# Patient Record
Sex: Male | Born: 1950 | Race: White | Hispanic: No | Marital: Married | Smoking: Never smoker
Health system: Southern US, Community
[De-identification: ages and names within clinical notes are randomized; demographics above are authoritative.]

## PROBLEM LIST (undated history)

## (undated) DIAGNOSIS — E785 Hyperlipidemia, unspecified: Secondary | ICD-10-CM

## (undated) DIAGNOSIS — R519 Headache, unspecified: Secondary | ICD-10-CM

## (undated) DIAGNOSIS — F329 Major depressive disorder, single episode, unspecified: Secondary | ICD-10-CM

## (undated) DIAGNOSIS — U071 COVID-19: Secondary | ICD-10-CM

## (undated) DIAGNOSIS — J189 Pneumonia, unspecified organism: Secondary | ICD-10-CM

## (undated) DIAGNOSIS — G4730 Sleep apnea, unspecified: Secondary | ICD-10-CM

## (undated) DIAGNOSIS — F32A Depression, unspecified: Secondary | ICD-10-CM

## (undated) DIAGNOSIS — F419 Anxiety disorder, unspecified: Secondary | ICD-10-CM

## (undated) DIAGNOSIS — J309 Allergic rhinitis, unspecified: Secondary | ICD-10-CM

## (undated) DIAGNOSIS — M1990 Unspecified osteoarthritis, unspecified site: Secondary | ICD-10-CM

## (undated) HISTORY — DX: Anxiety disorder, unspecified: F41.9

## (undated) HISTORY — PX: EYE SURGERY: SHX253

## (undated) HISTORY — PX: MEDIAL PARTIAL KNEE REPLACEMENT: SHX5965

## (undated) HISTORY — PX: TONSILLECTOMY: SUR1361

## (undated) HISTORY — DX: Hyperlipidemia, unspecified: E78.5

## (undated) HISTORY — DX: Pneumonia, unspecified organism: J18.9

## (undated) HISTORY — DX: Major depressive disorder, single episode, unspecified: F32.9

## (undated) HISTORY — DX: Depression, unspecified: F32.A

## (undated) HISTORY — PX: JOINT REPLACEMENT: SHX530

## (undated) HISTORY — PX: COLONOSCOPY: SHX174

## (undated) HISTORY — DX: Allergic rhinitis, unspecified: J30.9

## (undated) HISTORY — PX: KNEE SURGERY: SHX244

---

## 1990-01-29 HISTORY — PX: KNEE SURGERY: SHX244

## 1998-10-04 DIAGNOSIS — J189 Pneumonia, unspecified organism: Secondary | ICD-10-CM

## 1998-10-04 HISTORY — DX: Pneumonia, unspecified organism: J18.9

## 2003-12-10 ENCOUNTER — Ambulatory Visit: Payer: Self-pay | Admitting: Internal Medicine

## 2004-02-14 ENCOUNTER — Ambulatory Visit: Payer: Self-pay | Admitting: Internal Medicine

## 2004-05-17 ENCOUNTER — Ambulatory Visit: Payer: Self-pay | Admitting: Internal Medicine

## 2004-09-08 ENCOUNTER — Ambulatory Visit: Payer: Self-pay | Admitting: Internal Medicine

## 2004-09-19 ENCOUNTER — Ambulatory Visit: Payer: Self-pay | Admitting: Internal Medicine

## 2006-01-29 HISTORY — PX: KNEE ARTHROSCOPY W/ PARTIAL MEDIAL MENISCECTOMY: SHX1882

## 2006-08-19 ENCOUNTER — Ambulatory Visit: Payer: Self-pay | Admitting: Orthopaedic Surgery

## 2006-08-21 ENCOUNTER — Ambulatory Visit: Payer: Self-pay | Admitting: Orthopaedic Surgery

## 2006-08-21 ENCOUNTER — Emergency Department: Payer: Self-pay | Admitting: Emergency Medicine

## 2007-02-20 ENCOUNTER — Ambulatory Visit: Payer: Self-pay | Admitting: Internal Medicine

## 2007-02-20 DIAGNOSIS — J309 Allergic rhinitis, unspecified: Secondary | ICD-10-CM | POA: Insufficient documentation

## 2007-02-20 DIAGNOSIS — F3342 Major depressive disorder, recurrent, in full remission: Secondary | ICD-10-CM | POA: Insufficient documentation

## 2007-02-20 DIAGNOSIS — F3340 Major depressive disorder, recurrent, in remission, unspecified: Secondary | ICD-10-CM

## 2007-02-20 DIAGNOSIS — F331 Major depressive disorder, recurrent, moderate: Secondary | ICD-10-CM | POA: Insufficient documentation

## 2007-02-20 DIAGNOSIS — F411 Generalized anxiety disorder: Secondary | ICD-10-CM | POA: Insufficient documentation

## 2007-02-20 DIAGNOSIS — E785 Hyperlipidemia, unspecified: Secondary | ICD-10-CM | POA: Insufficient documentation

## 2007-02-24 ENCOUNTER — Telehealth (INDEPENDENT_AMBULATORY_CARE_PROVIDER_SITE_OTHER): Payer: Self-pay | Admitting: *Deleted

## 2007-04-09 ENCOUNTER — Telehealth (INDEPENDENT_AMBULATORY_CARE_PROVIDER_SITE_OTHER): Payer: Self-pay | Admitting: *Deleted

## 2007-06-19 ENCOUNTER — Telehealth (INDEPENDENT_AMBULATORY_CARE_PROVIDER_SITE_OTHER): Payer: Self-pay | Admitting: *Deleted

## 2007-07-31 ENCOUNTER — Ambulatory Visit: Payer: Self-pay | Admitting: Family Medicine

## 2007-10-24 ENCOUNTER — Ambulatory Visit: Payer: Self-pay | Admitting: Internal Medicine

## 2007-10-29 ENCOUNTER — Ambulatory Visit: Payer: Self-pay | Admitting: Internal Medicine

## 2007-10-30 LAB — CONVERTED CEMR LAB
Glucose, Bld: 91 mg/dL (ref 70–99)
LDL Cholesterol: 141 mg/dL — ABNORMAL HIGH (ref 0–99)
Total CHOL/HDL Ratio: 5.5
Triglycerides: 111 mg/dL (ref 0–149)

## 2007-12-10 ENCOUNTER — Ambulatory Visit: Payer: Self-pay | Admitting: Family Medicine

## 2007-12-10 DIAGNOSIS — M1710 Unilateral primary osteoarthritis, unspecified knee: Secondary | ICD-10-CM

## 2007-12-10 DIAGNOSIS — G4700 Insomnia, unspecified: Secondary | ICD-10-CM | POA: Insufficient documentation

## 2007-12-10 DIAGNOSIS — M2240 Chondromalacia patellae, unspecified knee: Secondary | ICD-10-CM | POA: Insufficient documentation

## 2007-12-10 DIAGNOSIS — IMO0002 Reserved for concepts with insufficient information to code with codable children: Secondary | ICD-10-CM | POA: Insufficient documentation

## 2008-08-09 ENCOUNTER — Ambulatory Visit: Payer: Self-pay | Admitting: Internal Medicine

## 2008-08-09 DIAGNOSIS — L02419 Cutaneous abscess of limb, unspecified: Secondary | ICD-10-CM | POA: Insufficient documentation

## 2008-08-09 DIAGNOSIS — L03119 Cellulitis of unspecified part of limb: Secondary | ICD-10-CM

## 2008-10-15 ENCOUNTER — Ambulatory Visit: Payer: Self-pay | Admitting: Internal Medicine

## 2008-10-15 DIAGNOSIS — R030 Elevated blood-pressure reading, without diagnosis of hypertension: Secondary | ICD-10-CM | POA: Insufficient documentation

## 2008-10-18 LAB — CONVERTED CEMR LAB
Albumin: 4.1 g/dL (ref 3.5–5.2)
Alkaline Phosphatase: 58 units/L (ref 39–117)
Basophils Absolute: 0 10*3/uL (ref 0.0–0.1)
Bilirubin, Direct: 0 mg/dL (ref 0.0–0.3)
Calcium: 9.2 mg/dL (ref 8.4–10.5)
Creatinine, Ser: 1 mg/dL (ref 0.4–1.5)
Eosinophils Absolute: 0.1 10*3/uL (ref 0.0–0.7)
Free T4: 0.9 ng/dL (ref 0.6–1.6)
Glucose, Bld: 88 mg/dL (ref 70–99)
Lymphocytes Relative: 39.1 % (ref 12.0–46.0)
MCHC: 34.5 g/dL (ref 30.0–36.0)
Microalb Creat Ratio: 3.2 mg/g (ref 0.0–30.0)
Monocytes Relative: 7.5 % (ref 3.0–12.0)
Neutrophils Relative %: 51.1 % (ref 43.0–77.0)
PSA: 1.4 ng/mL (ref 0.10–4.00)
Phosphorus: 3.4 mg/dL (ref 2.3–4.6)
RBC: 5.15 M/uL (ref 4.22–5.81)
RDW: 12.9 % (ref 11.5–14.6)
Sodium: 141 meq/L (ref 135–145)

## 2008-12-29 ENCOUNTER — Encounter: Payer: Self-pay | Admitting: Internal Medicine

## 2008-12-29 ENCOUNTER — Telehealth: Payer: Self-pay | Admitting: Internal Medicine

## 2009-01-12 ENCOUNTER — Ambulatory Visit: Payer: Self-pay | Admitting: Gastroenterology

## 2009-01-12 ENCOUNTER — Encounter: Payer: Self-pay | Admitting: Internal Medicine

## 2009-01-12 LAB — HM COLONOSCOPY: HM Colonoscopy: NORMAL

## 2009-02-15 ENCOUNTER — Telehealth: Payer: Self-pay | Admitting: Internal Medicine

## 2009-02-16 ENCOUNTER — Telehealth: Payer: Self-pay | Admitting: Internal Medicine

## 2009-02-28 ENCOUNTER — Ambulatory Visit: Payer: Self-pay | Admitting: Internal Medicine

## 2009-03-18 ENCOUNTER — Ambulatory Visit: Payer: Self-pay | Admitting: Unknown Physician Specialty

## 2009-04-20 ENCOUNTER — Ambulatory Visit: Payer: Self-pay | Admitting: Unknown Physician Specialty

## 2009-04-27 ENCOUNTER — Ambulatory Visit: Payer: Self-pay | Admitting: Unknown Physician Specialty

## 2009-05-30 ENCOUNTER — Ambulatory Visit: Payer: Self-pay | Admitting: Internal Medicine

## 2010-02-28 NOTE — Progress Notes (Signed)
Summary: Request medication for stress  Phone Note Call from Patient Call back at [phone] or [phone]   Caller: Patient Call For: Cindee Salt MD Summary of Call: Patient is calling to find out if you will put him back on the medication  that you had him previously for his nerves because it worked well. 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 [facility] [address] Initial call taken by: Sydell Axon LPN,  February 15, 2009 12:41 PM  Follow-up for Phone Call        Please let him know that Rx has been sent. Have him schedule an appt in 3-4 weeks to review how he is doing Follow-up by: Cindee Salt MD,  February 15, 2009 1:36 PM  Additional Follow-up for Phone Call Additional follow up Details #1::        Patient notified as instructed by telephone. Pt already has appt scheduled to see Dr. Alphonsus Sias on 02/28/09 at 3pm. Lewanda Rife LPN  February 15, 2009 3:37 PM     New/Updated Medications: CITALOPRAM HYDROBROMIDE 20 MG TABS (CITALOPRAM HYDROBROMIDE) 1 daily for mood problems Prescriptions: CITALOPRAM HYDROBROMIDE 20 MG TABS (CITALOPRAM HYDROBROMIDE) 1 daily for mood problems  #30 x 1   Entered and Authorized by:   Cindee Salt MD   Signed by:   Cindee Salt MD on 02/15/2009   Method used:   Electronically to        [facility] [phone]* (retail)       [address] [place], [place]  [address]       Ph: [phone] or [phone]       Fax: [phone]   RxID:   [id number]

## 2010-02-28 NOTE — Assessment & Plan Note (Signed)
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  Past Medical History: Reviewed history from 02/20/2007 and no changes required. Allergic rhinitis Depression Hyperlipidemia Anxiety  Past Surgical History: Reviewed history from 12/10/2007 and no changes required. Pneumonia- out patient 905/2000) Right knee, loose body removed (1992) 2008 Right knee arthroscopy, partial menisectomy and patellar chondroplasty ([facility])  Family History: Reviewed history from 10/24/2007 and no changes required. Father: deceased- rupture of AAA Mother: died with CAD,HTN, MI Prostate cancer in pat GF No DM  Social History: Reviewed history from 10/24/2007 and no changes required. 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  Uses breath rite strips ???sleep apnea---does wear out by 6PM wears seat belt. Eyes:  Denies double vision and vision loss-1 eye; occ sees "strange spots" for 30 minutes. Has had eye exams . ENT:  Denies decreased hearing and ringing in ears; teeth okay--regular with dentist. CV:  Denies chest pain or discomfort, difficulty breathing at night, difficulty breathing while lying down, fainting, lightheadness, palpitations, and shortness of breath with exertion. Resp:  Denies cough and shortness of breath. GI:  Complains of change in bowel habits; denies abdominal pain, bloody stools, dark tarry stools, indigestion, nausea, and vomiting; brief loose stools--relates to nerves acting up recently. GU:  Denies erectile dysfunction, urinary frequency, and urinary hesitancy. MS:  Complains of joint pain; denies joint swelling; having more knee pain Dr Gavin Potters did cortisone shots in left. Has trouble with right as well (surgery in past) tries to ride stationary bike Not much cardio. Derm:  Complains of dryness; denies lesion(s) and rash; dry feet--okay with lotion. Neuro:  Denies headaches, numbness, tingling, and weakness. Psych:  Complains of depression; denies anxiety; has the depressed mood but not the anxiety . Heme:  Denies abnormal bruising and enlarge lymph nodes. Allergy:  Denies seasonal allergies and sneezing.  Physical Exam  General:  alert and normal appearance.   Eyes:  pupils equal, pupils round, pupils reactive to light, and no optic disk abnormalities.   Ears:  R ear normal and L ear normal.   Mouth:  no erythema and no lesions.   Neck:  supple, no masses, no thyromegaly, no carotid bruits, and no cervical lymphadenopathy.   Lungs:  normal respiratory effort and normal breath sounds.  Heart:  normal rate, regular rhythm, no murmur, and no gallop.   Abdomen:  soft and non-tender.   Msk:  Knees thickened without effusions Pulses:  1+ in feet Extremities:  no edema Neurologic:   alert & oriented X3, strength normal in all extremities, and gait normal.   Skin:  no rashes and no suspicious lesions.   Axillary Nodes:  No palpable lymphadenopathy Psych:  normally interactive, good eye contact, not anxious appearing, and not depressed appearing.     Impression & Recommendations:  Problem # 1:  PREVENTIVE HEALTH CARE (ICD-V70.0) Assessment Comment Only healthy but out of shape wife worried about his heart---discussed increasing aerobic work and considering stress test if he has sig problems discussed continuing better eating  Problem # 2:  DEPRESSION (ICD-311) Assessment: Improved discussed need to continue for at least a year  His updated medication list for this problem includes:    Citalopram Hydrobromide 20 Mg Tabs (Citalopram hydrobromide) .Marland Kitchen... 1 daily for mood problems  Problem # 3:  INSOMNIA-SLEEP DISORDER-UNSPEC (ICD-780.52) Assessment: Comment Only discussed weaning down to prevent dependence  His updated medication list for this problem includes:    Zolpidem Tartrate 10 Mg Tabs (Zolpidem tartrate) .Marland Kitchen... 1/2-1 at bedtime as needed insomnia  Complete Medication List: 1)  Citalopram Hydrobromide 20 Mg Tabs (Citalopram hydrobromide) .Marland Kitchen.. 1 daily for mood problems 2)  Zolpidem Tartrate 10 Mg Tabs (Zolpidem tartrate) .... 1/2-1 at bedtime as needed insomnia 3)  Naproxen Sodium 275 Mg Tabs (Naproxen sodium) .... Pt not sure of dose  Patient Instructions: 1)  Please schedule a follow-up appointment in 3 months .   Current Allergies (reviewed today): No known allergies

## 2010-02-28 NOTE — Assessment & Plan Note (Signed)
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  Past Medical History: Reviewed history from 02/20/2007 and no changes required. Allergic rhinitis Depression Hyperlipidemia Anxiety  Past Surgical History: Reviewed history from 12/10/2007 and no changes required. Pneumonia- out patient 905/2000) Right knee, loose body removed (1992) 2008 Right knee arthroscopy, partial menisectomy and patellar chondroplasty ([facility])  Family History: Reviewed history from 10/24/2007 and no changes required. Father: deceased- rupture of AAA Mother: died with CAD,HTN, MI Prostate cancer in pat GF No DM  Social History: Reviewed history from 10/24/2007 and no changes required. 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 1 daily for mood problems  Complete Medication List: 1)  Citalopram Hydrobromide 20 Mg Tabs (Citalopram hydrobromide) .Marland Kitchen.. 1 daily for mood problems  Patient Instructions: 1)  Please schedule physical in 9-12 months  Current Allergies (reviewed today): No known allergies

## 2010-02-28 NOTE — Progress Notes (Signed)
Summary: Rx to help him sleep  Phone Note Call from Patient Call back at Home Phone [phone]   Caller: Patient Call For: Cindee Salt MD Summary of Call: Patient left a message on voicemail stating that he requested medication from Dr. Alphonsus Sias to help with anxiety but now he cannot sleep.  Hasn't slept in five nights.  He said Dr. Alphonsus Sias gave him some medication in the past to help him sleep and it worked very well.  He also took one of his wife's Ambien tablets and he said that worked well also.  Please advise.   Initial call taken by: Linde Gillis CMA Duncan Dull),  February 16, 2009 11:39 AM  Follow-up for Phone Call        okay  zolpidem 10mg   #30 x 0 1/2-1 at bedtime as needed insomnia Let him know that if he takes this regularly he can get dependent so limit use Follow-up by: Cindee Salt MD,  February 16, 2009 1:48 PM  Additional Follow-up for Phone Call Additional follow up Details #1::        Rx Called In, Spoke with patient and advised results.  Additional Follow-up by: Mervin Hack CMA Duncan Dull),  February 16, 2009 3:53 PM    New/Updated Medications: ZOLPIDEM TARTRATE 10 MG TABS (ZOLPIDEM TARTRATE) 1/2-1 at bedtime as needed insomnia Prescriptions: ZOLPIDEM TARTRATE 10 MG TABS (ZOLPIDEM TARTRATE) 1/2-1 at bedtime as needed insomnia  #30 x 0   Entered by:   Mervin Hack CMA (AAMA)   Authorized by:   Cindee Salt MD   Signed by:   Mervin Hack CMA (AAMA) on 02/16/2009   Method used:   Telephoned to ...       [facility] [phone][facility] (retail)       [address] [place], [place]  [address]       Ph: [phone] or [phone]       Fax: [phone]   RxID:   [id number] ZOLPIDEM TARTRATE 10 MG TABS (ZOLPIDEM TARTRATE) 1/2-1 at bedtime as needed insomnia  #30 x 0   Entered by:   Mervin Hack CMA (AAMA)   Authorized by:   Cindee Salt MD   Signed by:   Mervin Hack CMA (AAMA) on 02/16/2009   Method used:   Print  then Give to Patient   RxID:   [id number]

## 2010-03-11 ENCOUNTER — Encounter: Payer: Self-pay | Admitting: Internal Medicine

## 2010-04-24 ENCOUNTER — Other Ambulatory Visit: Payer: Self-pay

## 2010-05-04 ENCOUNTER — Encounter: Payer: Self-pay | Admitting: Internal Medicine

## 2010-05-04 DIAGNOSIS — Z0289 Encounter for other administrative examinations: Secondary | ICD-10-CM

## 2010-05-05 ENCOUNTER — Other Ambulatory Visit: Payer: Self-pay | Admitting: Internal Medicine

## 2010-05-08 ENCOUNTER — Other Ambulatory Visit: Payer: Self-pay | Admitting: *Deleted

## 2010-05-08 MED ORDER — CITALOPRAM HYDROBROMIDE 20 MG PO TABS: ORAL_TABLET | ORAL | Status: DC

## 2010-05-08 NOTE — Telephone Encounter (Signed)
Okay #30 x 1 He needs to reschedule the visit he no showed for

## 2010-05-08 NOTE — Telephone Encounter (Signed)
Ok to refill 

## 2010-05-08 NOTE — Telephone Encounter (Signed)
I think this is a duplicate Okay #30 x 0 but he needs to reschedule his appt he no showed for

## 2010-05-08 NOTE — Telephone Encounter (Signed)
RX SENT TO PHARMACY

## 2010-06-19 ENCOUNTER — Ambulatory Visit (INDEPENDENT_AMBULATORY_CARE_PROVIDER_SITE_OTHER): Payer: BC Managed Care – PPO | Admitting: Internal Medicine

## 2010-06-19 ENCOUNTER — Encounter: Payer: Self-pay | Admitting: Internal Medicine

## 2010-06-19 VITALS — BP 132/96 | HR 84 | Temp 98.6°F | Ht 67.5 in | Wt 211.5 lb

## 2010-06-19 DIAGNOSIS — L570 Actinic keratosis: Secondary | ICD-10-CM

## 2010-06-19 DIAGNOSIS — F329 Major depressive disorder, single episode, unspecified: Secondary | ICD-10-CM

## 2010-06-19 DIAGNOSIS — R030 Elevated blood-pressure reading, without diagnosis of hypertension: Secondary | ICD-10-CM

## 2010-06-19 DIAGNOSIS — Z0000 Encounter for general adult medical examination without abnormal findings: Secondary | ICD-10-CM

## 2010-06-19 DIAGNOSIS — G4700 Insomnia, unspecified: Secondary | ICD-10-CM

## 2010-06-19 NOTE — Patient Instructions (Addendum)
Please cut the citalopram in half and just take 10mg  daily If your mood is fine after one month, start skipping days. Skip one day a week for 5-7 days, then skip 2 days for 5-7 days, etc till off.  Restart if you have recurrence of depression symptoms when off the med or lowering the dose

## 2010-06-19 NOTE — Progress Notes (Signed)
Subjective:    Patient ID: Brendan Bell, Brendan Bell    DOB: 1950/11/28, 60 y.o.   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  , Disp: , Rfl:   Past Medical History  Diagnosis Date  . Rhinitis, allergic   . Depression   . Hyperlipidemia   . Anxiety   . Pneumonia 10/04/98    out patient    Past Surgical History  Procedure Date  . Knee surgery 1992    right, loose body removed  . Knee arthroscopy w/ partial medial meniscectomy 2008    with pateller chondroplasty ([facility])    Family History  Problem Relation Age of Onset  . Heart disease Mother     CAD, MI  . Hypertension Mother   . Stroke Father   . Aneurysm Father     died from rupture of AAA  . Cancer Paternal Grandfather     prostate    History   Social History  . Marital Status: Married    Spouse Name: N/A    Number of Children: 5  . Years of Education: N/A   Occupational History  . business owner     Education officer, museum, former Land   Social History Main Topics  . Smoking status: Never Smoker   . Smokeless tobacco: Never Used  . Alcohol Use: Yes     2 beers a day in general.  . Drug Use: No  . Sexually Active: Not on file   Other Topics Concern  . Not on file   Social History Narrative   2nd marriage.  3  children from first marriage, 2 stepchildren, 2 with current marriage.   Review of Systems  Constitutional: Negative for fever and fatigue.       Wears seat belt  HENT: Positive for dental problem. Negative for hearing loss, congestion, rhinorrhea and tinnitus.        Lost tooth and crown---wondering about implant  Eyes: Negative for visual disturbance.       No diplopia or focal vision loss  Respiratory: Negative for cough, chest tightness and shortness of breath.   Cardiovascular: Negative for chest pain, palpitations and leg swelling.  Gastrointestinal: Negative for nausea, vomiting, abdominal pain, constipation and blood in stool.       No heartburn  Genitourinary: Negative for dysuria, urgency, decreased urine volume and difficulty urinating.       Nocturia x 1 NO sexual problems  Musculoskeletal: Positive for arthralgias. Negative for back pain and joint swelling.       Knees are only sig arthritic part  Skin: Negative for rash.       Has firm nodule in front of right ear  Neurological: Negative for dizziness, syncope, weakness, light-headedness, numbness and headaches.       Occ numbness in hands if drives for a while--goes  away quickly  Hematological: Negative for adenopathy. Does not bruise/bleed easily.  Psychiatric/Behavioral: Negative for sleep disturbance and dysphoric mood. The patient is not nervous/anxious.        Objective:   Physical Exam  Constitutional: He is oriented to person, place, and time. He appears well-developed and well-nourished. No distress.  HENT:  Head: Normocephalic and atraumatic.  Right Ear: External ear normal.  Left Ear: External ear normal.  Mouth/Throat: Oropharynx is clear and moist. No oropharyngeal exudate.       TMs normal  Eyes: Conjunctivae and EOM are normal. Pupils are equal, round, and reactive to light.       Fundi benign  Neck: Normal range of motion. Neck supple. No thyromegaly present.  Cardiovascular: Normal rate, regular  rhythm, normal heart sounds and intact distal pulses.  Exam reveals no gallop.   No murmur heard. Pulmonary/Chest: Effort normal and breath sounds normal. No respiratory distress. He has no wheezes. He has no rales.  Abdominal: Soft. He exhibits no mass. There is no tenderness.  Musculoskeletal: Normal range of motion. He exhibits no edema and no tenderness.       Hands and knees are thickened without active synovitis  Lymphadenopathy:    He has no cervical adenopathy.  Neurological: He is alert and oriented to person, place, and time. He exhibits normal muscle tone.  Skin: Skin is warm. No rash noted.       Nodular irreg lesion in right preauricular area  Psychiatric: He has a normal mood and affect. His behavior is normal. Judgment and thought content normal.          Assessment & Plan:

## 2010-06-20 LAB — CBC WITH DIFFERENTIAL/PLATELET
Basophils Relative: 0.2 % (ref 0.0–3.0)
Eosinophils Absolute: 0.2 10*3/uL (ref 0.0–0.7)
Eosinophils Relative: 2.1 % (ref 0.0–5.0)
Hemoglobin: 15.6 g/dL (ref 13.0–17.0)
Lymphocytes Relative: 32.7 % (ref 12.0–46.0)
MCHC: 33.9 g/dL (ref 30.0–36.0)
MCV: 89.9 fl (ref 78.0–100.0)
Neutro Abs: 4.8 10*3/uL (ref 1.4–7.7)
Neutrophils Relative %: 59.4 % (ref 43.0–77.0)
RBC: 5.12 Mil/uL (ref 4.22–5.81)
WBC: 8 10*3/uL (ref 4.5–10.5)

## 2010-06-20 LAB — BASIC METABOLIC PANEL
Calcium: 9.3 mg/dL (ref 8.4–10.5)
GFR: 81.64 mL/min (ref >60.00)
Glucose, Bld: 89 mg/dL (ref 70–99)
Sodium: 140 mEq/L (ref 135–145)

## 2010-06-20 LAB — HEPATIC FUNCTION PANEL
Bilirubin, Direct: 0.1 mg/dL (ref 0.0–0.3)
Total Bilirubin: 0.6 mg/dL (ref 0.3–1.2)
Total Protein: 6.9 g/dL (ref 6.0–8.3)

## 2010-10-16 ENCOUNTER — Telehealth: Payer: Self-pay | Admitting: *Deleted

## 2010-10-16 NOTE — Telephone Encounter (Signed)
.  left message to have patient return my call.  

## 2010-10-16 NOTE — Telephone Encounter (Signed)
Please have him restart 20mg  daily #30 x 11 It is very bad to go on and off it frequently If he needs to go back on, he should use it a minimum of 7-9 months Should have appt in about 1 month to discuss how he is doing back on the med

## 2010-10-16 NOTE — Telephone Encounter (Signed)
Pt states he is under a lot of stress with his job and he would like to go back on citalopram.  He says he has been off of this for about 4-6 months but is feeling some anxiety.  He doesn't think he will need to be on it for very long.  [facility] [address]

## 2010-10-17 MED ORDER — CITALOPRAM HYDROBROMIDE 20 MG PO TABS: 20.0000 mg | ORAL_TABLET | Freq: Every day | ORAL | Status: DC

## 2010-10-17 NOTE — Telephone Encounter (Signed)
rx sent to pharmacy by e-script Spoke with patient and advised results   

## 2011-01-19 ENCOUNTER — Ambulatory Visit: Payer: Self-pay | Admitting: Unknown Physician Specialty

## 2011-01-29 ENCOUNTER — Encounter: Payer: Self-pay | Admitting: Internal Medicine

## 2011-01-29 ENCOUNTER — Ambulatory Visit (INDEPENDENT_AMBULATORY_CARE_PROVIDER_SITE_OTHER): Payer: BC Managed Care – PPO | Admitting: Internal Medicine

## 2011-01-29 DIAGNOSIS — F411 Generalized anxiety disorder: Secondary | ICD-10-CM

## 2011-01-29 DIAGNOSIS — F3289 Other specified depressive episodes: Secondary | ICD-10-CM

## 2011-01-29 DIAGNOSIS — M1710 Unilateral primary osteoarthritis, unspecified knee: Secondary | ICD-10-CM

## 2011-01-29 DIAGNOSIS — F329 Major depressive disorder, single episode, unspecified: Secondary | ICD-10-CM

## 2011-01-29 DIAGNOSIS — R030 Elevated blood-pressure reading, without diagnosis of hypertension: Secondary | ICD-10-CM

## 2011-01-29 DIAGNOSIS — IMO0002 Reserved for concepts with insufficient information to code with codable children: Secondary | ICD-10-CM

## 2011-01-29 MED ORDER — CITALOPRAM HYDROBROMIDE 40 MG PO TABS: 40.0000 mg | ORAL_TABLET | Freq: Every day | ORAL | Status: DC

## 2011-01-29 NOTE — Assessment & Plan Note (Signed)
Secondary to the anxiety

## 2011-01-29 NOTE — Progress Notes (Signed)
Subjective:    Patient ID: Brendan Bell, male    DOB: 12/27/1950, 60 y.o.   MRN: [id number]  HPI Here with wife  Has had a rough few weeks Lots of business decisions---really dropped off and he has been "in a spin" Worst month in a year He is thinking of selling out and moving on to something else Very stressful  "I don't like change" His BP has been up with the stress Wife can tell because he is taking "these cleansing breaths" 2 days ago 158/100 at pharmacy Later that day 144/90 Highest is 168 systolic Pulse at or just above 100  Skin pasty and red per wife  Has been very sedentary Knees partial knee replacement --coming up by Dr Erin Sons (just lateral side) Exercise tolerance is very poor  Gets chest sensation with the anxiety spells---"discomfort" No headaches No SOB at rest  Is depressed when anxiety acts up Not much enjoyment in the past month Hard time interacting with children when he gets anxious----winds up counseling them about being thrifty and then they get upset Wife notes he "thinks of worst case scenario all the time"  Current Outpatient Prescriptions on File Prior to Visit  Medication Sig Dispense Refill  . citalopram (CELEXA) 20 MG tablet Take 1 tablet (20 mg total) by mouth daily.  30 tablet  11  . flintstones complete (FLINTSTONES) 60 MG chewable tablet Chew 1 tablet by mouth once a week.          No Known Allergies  Past Medical History  Diagnosis Date  . Rhinitis, allergic   . Depression   . Hyperlipidemia   . Anxiety   . Pneumonia 10/04/98    out patient    Past Surgical History  Procedure Date  . Knee surgery 1992    right, loose body removed  . Knee arthroscopy w/ partial medial meniscectomy 2008    with pateller chondroplasty ([facility])    Family History  Problem Relation Age of Onset  . Heart disease Mother     CAD, MI  . Hypertension Mother   . Stroke Father   . Aneurysm Father     died from rupture of AAA  .  Cancer Paternal Grandfather     prostate    History   Social History  . Marital Status: Married    Spouse Name: N/A    Number of Children: 5  . Years of Education: N/A   Occupational History  . business owner     Education officer, museum, former Land   Social History Main Topics  . Smoking status: Never Smoker   . Smokeless tobacco: Never Used  . Alcohol Use: Yes     2 beers a day in general.  . Drug Use: No  . Sexually Active: Not on file   Other Topics Concern  . Not on file   Social History Narrative   2nd marriage.  3 children from first marriage, 2 stepchildren, 2 with current marriage.   Review of Systems Sleeps but then awakens at [facility] and can't get back to sleep. Uses ibuprofen PM Appetite is fine     Objective:   Physical Exam  Constitutional: He appears well-developed and well-nourished. No distress.  Neck: Normal range of motion. Neck supple. No thyromegaly present.       No carotid bruits  Cardiovascular: Normal rate, regular rhythm, normal heart sounds and intact distal pulses.  Exam reveals no gallop.   No murmur heard. Pulmonary/Chest: Effort normal  and breath sounds normal. No respiratory distress. He has no wheezes. He has no rales.  Abdominal: Soft. There is no tenderness.  Musculoskeletal: He exhibits no edema and no tenderness.  Lymphadenopathy:    He has no cervical adenopathy.  Psychiatric:       Very anxious Some depressed mood as well Appropriate affect No suicidal thoughts          Assessment & Plan:

## 2011-01-29 NOTE — Assessment & Plan Note (Signed)
Has surgery planned for January 18th Will recheck before then

## 2011-01-29 NOTE — Assessment & Plan Note (Signed)
Really worse due to situational stress May be worse due to his stopping the citalopram and only just restarting a couple of months ago Will increase to 40mg  daily

## 2011-01-29 NOTE — Patient Instructions (Signed)
Please increase the citalopram to 40mg  daily

## 2011-01-29 NOTE — Assessment & Plan Note (Addendum)
BP Readings from Last 3 Encounters:  01/29/11 160/90  06/19/10 132/96  05/30/09 130/80   Really seems to be secondary to his stress Repeat by me 164/104 on right after he spoke more about business problems, etc Chest pain with stress but no really exertional Has been limited due to knee and this is another factor  Will increase meds for anxiety No Rx for BP for now

## 2011-01-31 ENCOUNTER — Ambulatory Visit: Payer: Self-pay | Admitting: Unknown Physician Specialty

## 2011-02-13 ENCOUNTER — Ambulatory Visit (INDEPENDENT_AMBULATORY_CARE_PROVIDER_SITE_OTHER): Payer: BC Managed Care – PPO | Admitting: Internal Medicine

## 2011-02-13 ENCOUNTER — Encounter: Payer: Self-pay | Admitting: Internal Medicine

## 2011-02-13 DIAGNOSIS — F329 Major depressive disorder, single episode, unspecified: Secondary | ICD-10-CM

## 2011-02-13 DIAGNOSIS — R030 Elevated blood-pressure reading, without diagnosis of hypertension: Secondary | ICD-10-CM

## 2011-02-13 NOTE — Patient Instructions (Signed)
It would be okay to proceed with the partial knee replacement that you have planned for February 8th

## 2011-02-13 NOTE — Progress Notes (Signed)
  Subjective:    Patient ID: Brendan Bell, male    DOB: April 21, 1950, 61 y.o.   MRN: [id number]  HPI Here with wife again Doing better some Business still not good Notes from bank due in June---not likely to be renewed Looking at other options  Did increase the citalopram No apparent side effects  Switched to tylenol PM and melatonin at night Sleeping better BP had been higher so stopped the ibuprofen  Has had one anxiety attack last week Felt better after spending time on farm, doing work, hiking there, etc  Current Outpatient Prescriptions on File Prior to Visit  Medication Sig Dispense Refill  . celecoxib (CELEBREX) 200 MG capsule Take 200 mg by mouth daily. PATIENT NOT SURE OF DOSE       . citalopram (CELEXA) 40 MG tablet Take 1 tablet (40 mg total) by mouth daily.  30 tablet  11  . flintstones complete (FLINTSTONES) 60 MG chewable tablet Chew 1 tablet by mouth once a week.          No Known Allergies  Past Medical History  Diagnosis Date  . Rhinitis, allergic   . Depression   . Hyperlipidemia   . Anxiety   . Pneumonia 10/04/98    out patient    Past Surgical History  Procedure Date  . Knee surgery 1992    right, loose body removed  . Knee arthroscopy w/ partial medial meniscectomy 2008    with pateller chondroplasty ([facility])    Family History  Problem Relation Age of Onset  . Heart disease Mother     CAD, MI  . Hypertension Mother   . Stroke Father   . Aneurysm Father     died from rupture of AAA  . Cancer Paternal Grandfather     prostate    History   Social History  . Marital Status: Married    Spouse Name: N/A    Number of Children: 5  . Years of Education: N/A   Occupational History  . business owner     Education officer, museum, former Land   Social History Main Topics  . Smoking status: Never Smoker   . Smokeless tobacco: Never Used  . Alcohol Use: Yes     2 beers a day in general.  . Drug Use: No  . Sexually Active: Not  on file   Other Topics Concern  . Not on file   Social History Narrative   2nd marriage.  3 children from first marriage, 2 stepchildren, 2 with current marriage.     Review of Systems Appetite is okay Weight is down ~7# since last visit    Objective:   Physical Exam  Constitutional: He appears well-developed and well-nourished. No distress.  Psychiatric: He has a normal mood and affect. His behavior is normal. Judgment and thought content normal.          Assessment & Plan:

## 2011-02-13 NOTE — Assessment & Plan Note (Signed)
improved Still with stress with work but dealing with it better Will continue the citalopram 40 for at least 6-9 months then consider dropping dose Shouldn't stop the med after multiple relapses

## 2011-02-13 NOTE — Assessment & Plan Note (Addendum)
BP Readings from Last 3 Encounters:  02/13/11 140/90  01/29/11 160/90  06/19/10 132/96   Better now Repeat 134/90 on right Okay to proceed with the surgery

## 2011-04-04 ENCOUNTER — Encounter: Payer: Self-pay | Admitting: Internal Medicine

## 2011-04-04 ENCOUNTER — Ambulatory Visit (INDEPENDENT_AMBULATORY_CARE_PROVIDER_SITE_OTHER): Payer: BC Managed Care – PPO | Admitting: Internal Medicine

## 2011-04-04 DIAGNOSIS — F329 Major depressive disorder, single episode, unspecified: Secondary | ICD-10-CM

## 2011-04-04 NOTE — Assessment & Plan Note (Signed)
Seems to be in remission Will continue current dose of med Consider wean in 6 months but would not stop med due to multiple severe past relapses

## 2011-04-04 NOTE — Progress Notes (Signed)
  Subjective:    Patient ID: Brendan Bell, male    DOB: [birth year], 61 y.o.   MRN: [id number]  HPI Had right partial knee replacement in February Went well Has gone for rehab there and is almost done Close to full ROM Pain is improved  Mood is good---still spotty times where he gets upset "everything we own is for sale"--including business  No anger issues Still enjoys time out at farm, etc  Current Outpatient Prescriptions on File Prior to Visit  Medication Sig Dispense Refill  . celecoxib (CELEBREX) 200 MG capsule Take 200 mg by mouth daily. PATIENT NOT SURE OF DOSE       . flintstones complete (FLINTSTONES) 60 MG chewable tablet Chew 1 tablet by mouth once a week.          No Known Allergies  Past Medical History  Diagnosis Date  . Rhinitis, allergic   . Depression   . Hyperlipidemia   . Anxiety   . Pneumonia 10/04/98    out patient    Past Surgical History  Procedure Date  . Knee surgery 1992    right, loose body removed  . Knee arthroscopy w/ partial medial meniscectomy 2008    with pateller chondroplasty ([facility])  . Joint replacement 2/13    medial right knee---Dr Gavin Potters    Family History  Problem Relation Age of Onset  . Heart disease Mother     CAD, MI  . Hypertension Mother   . Stroke Father   . Aneurysm Father     died from rupture of AAA  . Cancer Paternal Grandfather     prostate    History   Social History  . Marital Status: Married    Spouse Name: N/A    Number of Children: 5  . Years of Education: N/A   Occupational History  . business owner     Education officer, museum, former Land   Social History Main Topics  . Smoking status: Never Smoker   . Smokeless tobacco: Never Used  . Alcohol Use: Yes     2 beers a day in general.  . Drug Use: No  . Sexually Active: Not on file   Other Topics Concern  . Not on file   Social History Narrative   2nd marriage.  3 children from first marriage, 2 stepchildren, 2 with  current marriage.   Review of Systems Sleeping well for him---5-6 hours. Awakens refreshed Appetite is good Hopes to start more exercise    Objective:   Physical Exam  Constitutional: He appears well-developed and well-nourished. No distress.  Psychiatric: He has a normal mood and affect. His behavior is normal. Thought content normal.          Assessment & Plan:

## 2011-05-24 ENCOUNTER — Other Ambulatory Visit: Payer: Self-pay | Admitting: *Deleted

## 2011-05-24 MED ORDER — CITALOPRAM HYDROBROMIDE 40 MG PO TABS: 40.0000 mg | ORAL_TABLET | Freq: Every day | ORAL | Status: DC

## 2011-05-24 NOTE — Telephone Encounter (Signed)
Faxed request asking for 90 day refill of CITALOPRAM, last OV this med was discontinued, also last refilled in 12/2010 for #30 x 11 refills, so pt should have enough IF still taking. Please advise

## 2011-05-24 NOTE — Telephone Encounter (Signed)
This med was definitely not stopped  Okay to fill 90 day supply x 3 Citalopram 40 daily

## 2011-05-24 NOTE — Telephone Encounter (Signed)
rx sent to pharmacy by e-script  

## 2011-08-01 ENCOUNTER — Ambulatory Visit: Payer: Self-pay | Admitting: Unknown Physician Specialty

## 2011-09-24 ENCOUNTER — Ambulatory Visit: Payer: Self-pay | Admitting: Unknown Physician Specialty

## 2011-10-16 ENCOUNTER — Ambulatory Visit (INDEPENDENT_AMBULATORY_CARE_PROVIDER_SITE_OTHER): Payer: BC Managed Care – PPO | Admitting: Internal Medicine

## 2011-10-16 ENCOUNTER — Encounter: Payer: Self-pay | Admitting: Internal Medicine

## 2011-10-16 VITALS — BP 112/80 | HR 90 | Temp 98.1°F | Ht 67.0 in | Wt 205.0 lb

## 2011-10-16 DIAGNOSIS — F329 Major depressive disorder, single episode, unspecified: Secondary | ICD-10-CM

## 2011-10-16 DIAGNOSIS — Z0000 Encounter for general adult medical examination without abnormal findings: Secondary | ICD-10-CM

## 2011-10-16 DIAGNOSIS — G4700 Insomnia, unspecified: Secondary | ICD-10-CM

## 2011-10-16 DIAGNOSIS — E785 Hyperlipidemia, unspecified: Secondary | ICD-10-CM

## 2011-10-16 DIAGNOSIS — M159 Polyosteoarthritis, unspecified: Secondary | ICD-10-CM | POA: Insufficient documentation

## 2011-10-16 DIAGNOSIS — M170 Bilateral primary osteoarthritis of knee: Secondary | ICD-10-CM

## 2011-10-16 DIAGNOSIS — IMO0002 Reserved for concepts with insufficient information to code with codable children: Secondary | ICD-10-CM

## 2011-10-16 DIAGNOSIS — M1710 Unilateral primary osteoarthritis, unspecified knee: Secondary | ICD-10-CM

## 2011-10-16 LAB — HEPATIC FUNCTION PANEL
ALT: 39 U/L (ref 0–53)
Alkaline Phosphatase: 73 U/L (ref 39–117)
Bilirubin, Direct: 0.2 mg/dL (ref 0.0–0.3)
Total Bilirubin: 1.1 mg/dL (ref 0.3–1.2)
Total Protein: 7.3 g/dL (ref 6.0–8.3)

## 2011-10-16 LAB — CBC WITH DIFFERENTIAL/PLATELET
Basophils Absolute: 0 10*3/uL (ref 0.0–0.1)
Eosinophils Absolute: 0.1 10*3/uL (ref 0.0–0.7)
HCT: 44.1 % (ref 39.0–52.0)
Lymphs Abs: 2.7 10*3/uL (ref 0.7–4.0)
MCHC: 33.3 g/dL (ref 30.0–36.0)
MCV: 89.3 fl (ref 78.0–100.0)
Monocytes Absolute: 0.6 10*3/uL (ref 0.1–1.0)
Neutrophils Relative %: 58.2 % (ref 43.0–77.0)
Platelets: 316 10*3/uL (ref 150.0–400.0)
RDW: 14.1 % (ref 11.5–14.6)
WBC: 8.3 10*3/uL (ref 4.5–10.5)

## 2011-10-16 LAB — BASIC METABOLIC PANEL
BUN: 19 mg/dL (ref 6–23)
Chloride: 106 mEq/L (ref 96–112)
Creatinine, Ser: 1.1 mg/dL (ref 0.4–1.5)
Glucose, Bld: 80 mg/dL (ref 70–99)
Potassium: 4.8 mEq/L (ref 3.5–5.1)

## 2011-10-16 NOTE — Progress Notes (Signed)
Subjective:    Patient ID: Brendan Bell, male    DOB: Apr 04, 1950, 61 y.o.   MRN: [id number]  HPI Had partial knee replacement by Dr Gavin Potters 2 weeks ago Has done very well with this  Business still going Got extension on line of credit [place] the beach house finally  Mood has been great since [employer] Still with some trouble initiating sleep----anxiety is better. Tylenol PM only occasionally  Current Outpatient Prescriptions on File Prior to Visit  Medication Sig Dispense Refill  . citalopram (CELEXA) 40 MG tablet Take 1 tablet (40 mg total) by mouth daily.  90 tablet  3    No Known Allergies  Past Medical History  Diagnosis Date  . Rhinitis, allergic   . Depression   . Hyperlipidemia   . Anxiety   . Pneumonia 10/04/98    out patient    Past Surgical History  Procedure Date  . Knee surgery 1992    right, loose body removed  . Knee arthroscopy w/ partial medial meniscectomy 2008    with pateller chondroplasty ([facility])  . Joint replacement 2/13///9/13    medial right knee///medial left knee---Dr Gavin Potters    Family History  Problem Relation Age of Onset  . Heart disease Mother     CAD, MI  . Hypertension Mother   . Stroke Father   . Aneurysm Father     died from rupture of AAA  . Cancer Paternal Grandfather     prostate    History   Social History  . Marital Status: Married    Spouse Name: N/A    Number of Children: 5  . Years of Education: N/A   Occupational History  . business owner     Education officer, museum, former Land   Social History Main Topics  . Smoking status: Never Smoker   . Smokeless tobacco: Never Used  . Alcohol Use: Yes     2 beers a day in general.  . Drug Use: No  . Sexually Active: Not on file   Other Topics Concern  . Not on file   Social History Narrative   2nd marriage.  3 children from first marriage, 2 stepchildren, 2 with current marriage.   Review of Systems  Constitutional: Negative for  fatigue and unexpected weight change.       Wears seat belt  HENT: Positive for dental problem. Negative for hearing loss and tinnitus.        Seeing dentist for bad tooth---has "sinus discharge"---may be related  Eyes: Negative for visual disturbance.       No diplopia or unilateral vision loss  Respiratory: Negative for cough, chest tightness and shortness of breath.   Cardiovascular: Negative for chest pain, palpitations and leg swelling.       BP has been great lately  Gastrointestinal: Negative for nausea, vomiting, abdominal pain, constipation and blood in stool.       Occ heartburn if dietary issues-- no meds  Genitourinary: Positive for frequency. Negative for difficulty urinating.       Mild increased frequency No sexual problems  Musculoskeletal: Positive for arthralgias. Negative for back pain and joint swelling.       Knees mostly Some mild hand pain at times--some DIP nodules  Skin: Positive for rash.       Has irritated area in front of right ear Chronic groin rash--using different shampoo there. Occ uses fungal cream  Neurological: Negative for dizziness, syncope, weakness, light-headedness, numbness and headaches.  Hematological: Negative for adenopathy. Does not bruise/bleed easily.  Psychiatric/Behavioral: Positive for disturbed wake/sleep cycle. Negative for dysphoric mood. The patient is not nervous/anxious.        Objective:   Physical Exam  Constitutional: He is oriented to person, place, and time. He appears well-developed and well-nourished. No distress.  HENT:  Head: Normocephalic and atraumatic.  Right Ear: External ear normal.  Left Ear: External ear normal.  Mouth/Throat: Oropharynx is clear and moist. No oropharyngeal exudate.  Eyes: Conjunctivae normal and EOM are normal. Pupils are equal, round, and reactive to light.  Neck: Normal range of motion. Neck supple. No thyromegaly present.  Cardiovascular: Normal rate, regular rhythm, normal heart sounds  and intact distal pulses.  Exam reveals no gallop.   No murmur heard. Pulmonary/Chest: Effort normal and breath sounds normal. No respiratory distress. He has no wheezes. He has no rales.  Abdominal: Soft. There is no tenderness.  Musculoskeletal:       Staples in left knee No active synovitis  Lymphadenopathy:    He has no cervical adenopathy.  Neurological: He is alert and oriented to person, place, and time.  Skin: Rash noted. There is erythema.       Classic groin rash          Assessment & Plan:

## 2011-10-16 NOTE — Assessment & Plan Note (Signed)
Healthy Discussed fitness now that his knees are fixed Will check PSA after discussion

## 2011-10-16 NOTE — Assessment & Plan Note (Signed)
Discussed this Last total 200 Won't recheck

## 2011-10-16 NOTE — Assessment & Plan Note (Signed)
Better now Uses tylenol pm only occasionally

## 2011-10-16 NOTE — Assessment & Plan Note (Signed)
Controlled now Due to past relapses, will continue the medication

## 2011-10-17 ENCOUNTER — Encounter: Payer: Self-pay | Admitting: *Deleted

## 2011-12-10 ENCOUNTER — Ambulatory Visit (INDEPENDENT_AMBULATORY_CARE_PROVIDER_SITE_OTHER): Payer: BC Managed Care – PPO | Admitting: Internal Medicine

## 2011-12-10 ENCOUNTER — Encounter: Payer: Self-pay | Admitting: Internal Medicine

## 2011-12-10 VITALS — BP 132/80 | HR 99 | Temp 98.4°F | Wt 213.0 lb

## 2011-12-10 DIAGNOSIS — L255 Unspecified contact dermatitis due to plants, except food: Secondary | ICD-10-CM

## 2011-12-10 MED ORDER — PREDNISONE 20 MG PO TABS: 40.0000 mg | ORAL_TABLET | Freq: Every day | ORAL | Status: DC

## 2011-12-10 MED ORDER — METHYLPREDNISOLONE ACETATE 40 MG/ML IJ SUSP
40.0000 mg | Freq: Once | INTRAMUSCULAR | Status: AC
  Administered 2011-12-10: 40 mg via INTRAMUSCULAR

## 2011-12-10 NOTE — Assessment & Plan Note (Signed)
Worst on face with swelling and generalized redness  Swelling in hands also Will give depomedrol shot Prednisone-- and OTC antihistamines

## 2011-12-10 NOTE — Patient Instructions (Signed)
Please try over the counter cetirizine 10mg  once or twice a day also

## 2011-12-10 NOTE — Progress Notes (Signed)
  Subjective:    Patient ID: Brendan Bell, male    DOB: 21-Jun-1950, 61 y.o.   MRN: [id number]  HPI Got into patch of poison ivy 4 days ago Rash started the next night Quickly got worse Face, arms and slight on trunk  Has tried calamine without much help Cortisone lotion also  Current Outpatient Prescriptions on File Prior to Visit  Medication Sig Dispense Refill  . citalopram (CELEXA) 40 MG tablet Take 1 tablet (40 mg total) by mouth daily.  90 tablet  3    No Known Allergies  Past Medical History  Diagnosis Date  . Rhinitis, allergic   . Depression   . Hyperlipidemia   . Anxiety   . Pneumonia 10/04/98    out patient    Past Surgical History  Procedure Date  . Knee surgery 1992    right, loose body removed  . Knee arthroscopy w/ partial medial meniscectomy 2008    with pateller chondroplasty ([facility])  . Joint replacement 2/13///9/13    medial right knee///medial left knee---Dr Gavin Potters    Family History  Problem Relation Age of Onset  . Heart disease Mother     CAD, MI  . Hypertension Mother   . Stroke Father   . Aneurysm Father     died from rupture of AAA  . Cancer Paternal Grandfather     prostate    History   Social History  . Marital Status: Married    Spouse Name: N/A    Number of Children: 5  . Years of Education: N/A   Occupational History  . business owner     Education officer, museum, former Land   Social History Main Topics  . Smoking status: Never Smoker   . Smokeless tobacco: Never Used  . Alcohol Use: Yes     Comment: 2 beers a day in general.  . Drug Use: No  . Sexually Active: Not on file   Other Topics Concern  . Not on file   Social History Narrative   2nd marriage.  3 children from first marriage, 2 stepchildren, 2 with current marriage.   Review of Systems No fever No throat or mouth swelling    Objective:   Physical Exam  Constitutional: He appears well-developed and well-nourished. No distress.  Skin:         Rash on face Swelling and redness in hands Mild rash on arms and trunk          Assessment & Plan:

## 2011-12-10 NOTE — Addendum Note (Signed)
Addended by: Sueanne Margarita on: 12/10/2011 03:03 PM   Modules accepted: Orders

## 2012-02-25 ENCOUNTER — Telehealth: Payer: Self-pay | Admitting: Internal Medicine

## 2012-02-25 NOTE — Telephone Encounter (Signed)
Open encounter in error..

## 2012-04-03 ENCOUNTER — Other Ambulatory Visit: Payer: Self-pay | Admitting: Internal Medicine

## 2012-10-15 ENCOUNTER — Ambulatory Visit (INDEPENDENT_AMBULATORY_CARE_PROVIDER_SITE_OTHER): Payer: PRIVATE HEALTH INSURANCE | Admitting: Internal Medicine

## 2012-10-15 ENCOUNTER — Encounter: Payer: Self-pay | Admitting: Internal Medicine

## 2012-10-15 VITALS — BP 128/90 | HR 79 | Temp 98.5°F | Ht 67.0 in | Wt 218.0 lb

## 2012-10-15 DIAGNOSIS — R5383 Other fatigue: Secondary | ICD-10-CM | POA: Insufficient documentation

## 2012-10-15 DIAGNOSIS — E785 Hyperlipidemia, unspecified: Secondary | ICD-10-CM

## 2012-10-15 DIAGNOSIS — R5381 Other malaise: Secondary | ICD-10-CM

## 2012-10-15 DIAGNOSIS — F411 Generalized anxiety disorder: Secondary | ICD-10-CM

## 2012-10-15 DIAGNOSIS — F329 Major depressive disorder, single episode, unspecified: Secondary | ICD-10-CM

## 2012-10-15 DIAGNOSIS — Z0000 Encounter for general adult medical examination without abnormal findings: Secondary | ICD-10-CM

## 2012-10-15 LAB — BASIC METABOLIC PANEL
CO2: 28 mEq/L (ref 19–32)
Calcium: 9 mg/dL (ref 8.4–10.5)
Creatinine, Ser: 1.2 mg/dL (ref 0.4–1.5)
GFR: 63.19 mL/min (ref >60.00)
Sodium: 135 mEq/L (ref 135–145)

## 2012-10-15 LAB — HEPATIC FUNCTION PANEL
ALT: 24 U/L (ref 0–53)
Bilirubin, Direct: 0 mg/dL (ref 0.0–0.3)
Total Bilirubin: 0.8 mg/dL (ref 0.3–1.2)

## 2012-10-15 LAB — CBC WITH DIFFERENTIAL/PLATELET
Basophils Relative: 0.5 % (ref 0.0–3.0)
Eosinophils Absolute: 0.2 10*3/uL (ref 0.0–0.7)
Eosinophils Relative: 2.2 % (ref 0.0–5.0)
Hemoglobin: 16.3 g/dL (ref 13.0–17.0)
Lymphocytes Relative: 37.2 % (ref 12.0–46.0)
Monocytes Relative: 8.1 % (ref 3.0–12.0)
Neutro Abs: 3.7 10*3/uL (ref 1.4–7.7)
Neutrophils Relative %: 52 % (ref 43.0–77.0)
RBC: 5.4 Mil/uL (ref 4.22–5.81)
WBC: 7.1 10*3/uL (ref 4.5–10.5)

## 2012-10-15 LAB — LIPID PANEL
Cholesterol: 213 mg/dL — ABNORMAL HIGH (ref 0–200)
Total CHOL/HDL Ratio: 5
Triglycerides: 122 mg/dL (ref 0.0–149.0)
VLDL: 24.4 mg/dL (ref 0.0–40.0)

## 2012-10-15 NOTE — Assessment & Plan Note (Signed)
In remission Needs to continue the med

## 2012-10-15 NOTE — Progress Notes (Signed)
Subjective:    Patient ID: Brendan Bell, male    DOB: 01-09-1951, 62 y.o.   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 citalopram (CELEXA) 40 MG tablet TAKE 1 TABLET BY MOUTH EVERY DAY  90 tablet  3   No current facility-administered medications on file prior to visit.    No Known Allergies  Past Medical History  Diagnosis Date  . Rhinitis, allergic   . Depression   . Hyperlipidemia   . Anxiety   . Pneumonia 10/04/98    out patient    Past Surgical History  Procedure Laterality Date  . Knee surgery  1992    right, loose body removed  . Knee arthroscopy w/ partial medial meniscectomy  2008    with pateller chondroplasty ([facility])  . Joint replacement  2/13///9/13    medial right knee///medial left knee---Dr Gavin Potters    Family History  Problem Relation Age of Onset  . Heart disease Mother     CAD, MI  . Hypertension Mother   . Stroke Father   . Aneurysm Father     died from rupture of AAA  . Cancer Paternal Grandfather     prostate    History   Social History  . Marital Status: Married    Spouse Name: N/A    Number of Children: 5  . Years of Education: N/A   Occupational History  . Manager     [employer]   Social History Main Topics  . Smoking status: Never Smoker   . Smokeless tobacco: Never Used  . Alcohol Use: Yes     Comment: 2 beers a day in general.  . Drug Use: No  . Sexual Activity: Not on file   Other Topics Concern  . Not on file   Social History Narrative   2nd marriage.  3 children from first marriage, 2 stepchildren, 2 with current marriage.    Review of Systems  Constitutional: Positive for fatigue and unexpected weight change.       Wears seat belt  HENT: Negative for hearing loss, dental problem and tinnitus.        Regular with dentist  Eyes: Positive for visual disturbance.       Still has floaters but no persistent vision loss (will get wavy lines and partial vision loss---no known migraines)  Respiratory: Positive for shortness of breath. Negative for cough and chest tightness.        Gets SOB with anxiety spells  Cardiovascular: Negative for chest pain, palpitations and leg swelling.  Gastrointestinal: Negative for nausea, vomiting, abdominal pain, constipation and blood in stool.       Some heartburn--relates to anxiety and work pressure. No meds  Endocrine: Positive for heat intolerance. Negative for cold intolerance.  Genitourinary: Positive for frequency and difficulty urinating. Negative for urgency.       Mild ED--not ready for meds Some nocturia  Musculoskeletal: Positive for arthralgias. Negative for back pain and joint swelling.       Some aching after activity--relates to being out of shape  Skin: Negative for rash.  Some actinics removed from face by Dr Roseanne Kaufman  Allergic/Immunologic: Negative for environmental allergies and immunocompromised state.  Neurological: Negative for dizziness, syncope, weakness, light-headedness, numbness and headaches.  Hematological: Negative for adenopathy. Does not bruise/bleed easily.  Psychiatric/Behavioral: Negative for sleep disturbance and dysphoric mood. The patient is nervous/anxious.        Objective:   Physical Exam  Constitutional: He is oriented to person, place, and time. He appears well-developed and well-nourished. No distress.  HENT:  Head: Normocephalic and atraumatic.  Right Ear: External ear normal.  Left Ear: External ear normal.  Mouth/Throat: Oropharynx is clear and moist. No oropharyngeal exudate.  Eyes: Conjunctivae and EOM are normal.  Pupils are equal, round, and reactive to light.  Neck: Normal range of motion. Neck supple. No thyromegaly present.  Cardiovascular: Normal rate, regular rhythm, normal heart sounds and intact distal pulses.  Exam reveals no gallop.   No murmur heard. Pulmonary/Chest: Effort normal and breath sounds normal. No respiratory distress. He has no wheezes. He has no rales.  Abdominal: Soft. There is no tenderness.  Musculoskeletal: He exhibits no edema and no tenderness.  Lymphadenopathy:    He has no cervical adenopathy.  Neurological: He is alert and oriented to person, place, and time.  Skin: No rash noted. No erythema.  Psychiatric: He has a normal mood and affect. His behavior is normal.          Assessment & Plan:

## 2012-10-15 NOTE — Assessment & Plan Note (Signed)
Seems stress related Needs to start exercise Will check labs

## 2012-10-15 NOTE — Assessment & Plan Note (Signed)
Healthy but out of shape Discussed fitness Prefers no flu shot Will defer PSA to next year

## 2012-10-15 NOTE — Assessment & Plan Note (Signed)
Mostly situational No Rx seperately

## 2012-10-16 ENCOUNTER — Encounter: Payer: Self-pay | Admitting: *Deleted

## 2013-04-22 ENCOUNTER — Other Ambulatory Visit: Payer: Self-pay | Admitting: Internal Medicine

## 2013-10-21 ENCOUNTER — Encounter: Payer: PRIVATE HEALTH INSURANCE | Admitting: Internal Medicine

## 2013-10-21 DIAGNOSIS — Z0289 Encounter for other administrative examinations: Secondary | ICD-10-CM

## 2013-10-22 ENCOUNTER — Telehealth: Payer: Self-pay | Admitting: Internal Medicine

## 2013-10-22 ENCOUNTER — Encounter: Payer: Self-pay | Admitting: Internal Medicine

## 2013-10-22 NOTE — Telephone Encounter (Signed)
Patient did not come for their scheduled appointment 10/21/13 for cpx.  Please let me know if the patient needs to be contacted immediately for follow up or if no follow up is necessary.

## 2013-10-22 NOTE — Telephone Encounter (Signed)
He needs to reschedule PE but not emergent

## 2013-10-22 NOTE — Telephone Encounter (Signed)
Sent no show letter to pt

## 2013-12-14 ENCOUNTER — Encounter: Payer: Self-pay | Admitting: Internal Medicine

## 2013-12-14 ENCOUNTER — Ambulatory Visit (INDEPENDENT_AMBULATORY_CARE_PROVIDER_SITE_OTHER): Payer: PRIVATE HEALTH INSURANCE | Admitting: Internal Medicine

## 2013-12-14 VITALS — BP 140/88 | HR 76 | Temp 98.0°F | Ht 67.0 in | Wt 210.0 lb

## 2013-12-14 DIAGNOSIS — Z0000 Encounter for general adult medical examination without abnormal findings: Secondary | ICD-10-CM

## 2013-12-14 DIAGNOSIS — E785 Hyperlipidemia, unspecified: Secondary | ICD-10-CM

## 2013-12-14 DIAGNOSIS — F3340 Major depressive disorder, recurrent, in remission, unspecified: Secondary | ICD-10-CM

## 2013-12-14 MED ORDER — CITALOPRAM HYDROBROMIDE 40 MG PO TABS: 40.0000 mg | ORAL_TABLET | Freq: Every day | ORAL | Status: DC

## 2013-12-14 NOTE — Assessment & Plan Note (Signed)
Still quiet on the meds Will continue indefinitely

## 2013-12-14 NOTE — Progress Notes (Signed)
Subjective:    Patient ID: Brendan Bell, male    DOB: Aug 10, 1950, 63 y.o.   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 citalopram (CELEXA) 40 MG tablet TAKE 1 TABLET BY MOUTH EVERY DAY 90 tablet 2   No current facility-administered medications on file prior to visit.    No Known Allergies  Past Medical History  Diagnosis Date  . Rhinitis, allergic   . Depression   . Hyperlipidemia   . Anxiety   . Pneumonia 10/04/98    out patient    Past Surgical History  Procedure Laterality Date  . Knee surgery  1992    right, loose body removed  . Knee arthroscopy w/ partial medial meniscectomy  2008    with pateller chondroplasty ([facility])  . Joint replacement  2/13///9/13    medial right knee///medial left knee---Dr Jefm Bryant    Family History  Problem Relation Age of Onset  . Heart disease Mother     CAD, MI  . Hypertension Mother   . Stroke Father   . Aneurysm Father     died from rupture of AAA  . Cancer Paternal Grandfather     prostate    History   Social History  . Marital Status: Married    Spouse Name: N/A    Number of Children: 73  . Years of Education: N/A   Occupational History  . Manager     [place] History Main Topics  . Smoking status: Never Smoker   . Smokeless tobacco: Never Used  . Alcohol Use: Yes     Comment: 2 beers a day in general.  . Drug Use: No  . Sexual Activity: Not on file   Other Topics Concern  . Not on file   Social History Narrative   2nd marriage.  3 children from first marriage, 2 stepchildren, 2 with current marriage.   Review of Systems  Constitutional: Negative for fatigue and unexpected weight change.    Wears seat belt  HENT: Positive for dental problem. Negative for hearing loss and tinnitus.        Some troubles with teeth--trying to care better for them  Eyes: Negative for visual disturbance.       No diplopia or unilateral vision loss Still occ visual field disturbances (?migraine phenomena)  Respiratory: Negative for cough, chest tightness and shortness of breath.   Cardiovascular: Negative for chest pain, palpitations and leg swelling.  Gastrointestinal: Negative for nausea, vomiting, abdominal pain, constipation and blood in stool.  Endocrine: Negative for polydipsia and polyuria.  Genitourinary: Negative for urgency and frequency.       Nocturia x 2 No sexual problems  Musculoskeletal: Positive for arthralgias. Negative for back pain and joint swelling.       Still with knee and hand stiffness--uses ibuprofen 400-600 prn (?weekly)  Allergic/Immunologic: Negative for environmental allergies and immunocompromised state.  Neurological: Positive for numbness. Negative for dizziness, syncope, weakness, light-headedness and headaches.       Occ right hand numbness  Hematological: Negative for adenopathy. Does not bruise/bleed easily.  Psychiatric/Behavioral: Positive for dysphoric mood. Negative for sleep disturbance. The patient is not hyperactive.  Has some depressed mood at times--missed some meds around the time of his daughter's wedding and noticed it Computer upgrade at work--- stressful       Objective:   Physical Exam  Constitutional: He is oriented to person, place, and time. He appears well-developed and well-nourished. No distress.  HENT:  Head: Normocephalic and atraumatic.  Right Ear: External ear normal.  Left Ear: External ear normal.  Mouth/Throat: Oropharynx is clear and moist. No oropharyngeal exudate.  Eyes: Conjunctivae and EOM are normal. Pupils are equal, round, and reactive to light.  Neck: Normal range of motion. Neck supple. No thyromegaly present.   Cardiovascular: Normal rate, regular rhythm, normal heart sounds and intact distal pulses.  Exam reveals no gallop.   No murmur heard. Pulmonary/Chest: Effort normal and breath sounds normal. No respiratory distress. He has no wheezes. He has no rales.  Abdominal: Soft. There is no tenderness.  Musculoskeletal: He exhibits no edema or tenderness.  Lymphadenopathy:    He has no cervical adenopathy.  Neurological: He is alert and oriented to person, place, and time.  Skin: No rash noted. No erythema.  Psychiatric: He has a normal mood and affect. His behavior is normal.          Assessment & Plan:

## 2013-12-14 NOTE — Assessment & Plan Note (Signed)
Mild He prefers no meds Will continue with lifestyle and defer blood testing this year

## 2013-12-14 NOTE — Progress Notes (Signed)
Pre visit review using our clinic review tool, if applicable. No additional management support is needed unless otherwise documented below in the visit note. 

## 2013-12-14 NOTE — Assessment & Plan Note (Signed)
Healthy and working on fitness Has lost some weight Prefers no flu shot Discussed PSA--he prefers no prostate cancer screening Will defer blood work this year

## 2014-08-20 ENCOUNTER — Ambulatory Visit (INDEPENDENT_AMBULATORY_CARE_PROVIDER_SITE_OTHER): Payer: PRIVATE HEALTH INSURANCE | Admitting: Internal Medicine

## 2014-08-20 ENCOUNTER — Encounter: Payer: Self-pay | Admitting: Internal Medicine

## 2014-08-20 VITALS — BP 120/80 | HR 78 | Temp 98.2°F | Ht 67.0 in | Wt 197.0 lb

## 2014-08-20 DIAGNOSIS — E785 Hyperlipidemia, unspecified: Secondary | ICD-10-CM

## 2014-08-20 LAB — LIPID PANEL
Cholesterol: 180 mg/dL (ref 0–200)
HDL: 45.1 mg/dL (ref >39.00)
LDL Cholesterol: 120 mg/dL — ABNORMAL HIGH (ref 0–99)
NONHDL: 134.9
TRIGLYCERIDES: 75 mg/dL (ref 0.0–149.0)
Total CHOL/HDL Ratio: 4
VLDL: 15 mg/dL (ref 0.0–40.0)

## 2014-08-20 LAB — GLUCOSE, RANDOM: Glucose, Bld: 78 mg/dL (ref 70–99)

## 2014-08-20 NOTE — Progress Notes (Signed)
   Subjective:    Patient ID: Brendan Bell, male    DOB: November 06, 1950, 64 y.o.   MRN: [id number]  HPI  Just needs blood work Has lost weight-- 13#.  Trying to eat better  Review of Systems     Objective:   Physical Exam        Assessment & Plan:

## 2014-08-20 NOTE — Progress Notes (Signed)
Pre visit review using our clinic review tool, if applicable. No additional management support is needed unless otherwise documented below in the visit note. 

## 2014-08-20 NOTE — Assessment & Plan Note (Signed)
Not too bad He doesn't want primary prevention with statin

## 2014-08-23 ENCOUNTER — Encounter: Payer: Self-pay | Admitting: *Deleted

## 2014-10-25 ENCOUNTER — Telehealth: Payer: Self-pay | Admitting: Family Medicine

## 2014-10-25 NOTE — Telephone Encounter (Signed)
I have scheduled patient for a new patient appointment with Dr. Tamala Julian on 10/5 for twisting left knee.  Wife is requesting to work in sooner if possible.

## 2014-10-26 NOTE — Telephone Encounter (Signed)
Spoke to pt, he's coming in Thursday at 11am.

## 2014-10-28 ENCOUNTER — Telehealth: Payer: Self-pay | Admitting: Family Medicine

## 2014-10-28 ENCOUNTER — Encounter: Payer: Self-pay | Admitting: Family Medicine

## 2014-10-28 ENCOUNTER — Ambulatory Visit (INDEPENDENT_AMBULATORY_CARE_PROVIDER_SITE_OTHER): Payer: PRIVATE HEALTH INSURANCE

## 2014-10-28 ENCOUNTER — Ambulatory Visit (INDEPENDENT_AMBULATORY_CARE_PROVIDER_SITE_OTHER): Payer: PRIVATE HEALTH INSURANCE | Admitting: Family Medicine

## 2014-10-28 ENCOUNTER — Ambulatory Visit
Admission: RE | Admit: 2014-10-28 | Discharge: 2014-10-28 | Disposition: A | Discharge status: Home / Self Care | Payer: PRIVATE HEALTH INSURANCE | Source: Ambulatory Visit | Attending: Family Medicine | Admitting: Family Medicine

## 2014-10-28 VITALS — BP 138/86 | HR 77 | Ht 67.0 in | Wt 199.0 lb

## 2014-10-28 DIAGNOSIS — M25562 Pain in left knee: Secondary | ICD-10-CM

## 2014-10-28 DIAGNOSIS — M2352 Chronic instability of knee, left knee: Secondary | ICD-10-CM | POA: Insufficient documentation

## 2014-10-28 DIAGNOSIS — M25551 Pain in right hip: Secondary | ICD-10-CM | POA: Diagnosis not present

## 2014-10-28 NOTE — Progress Notes (Signed)
[facility] [address], [place] [address] Phone: [phone] Subjective:    I'm seeing this patient by the request  of:  [name], MD   CC: Left knee and right hip pain  [id number] Brendan Bell is a 64 y.o. male coming in with complaint of left knee and right hip pain.  Regarding patient's left knee pain he is had this for quite some time. 5 years ago patient did have a partial knee replacement of the medial compartment. Patient states it never seemed to get significantly better like his contralateral side did with the same surgery. Patient states that sometimes it feels a little bit unstable. States that it stays swollen. Has pain at rest as well as with standing. Seems to be Minorly over the course of time. Has not given way on him but is unable to do his daily activities. Rates the severity of pain a 4 out of 10. Patient is concerned because it is stopping him from increasing his activity and staying as active as he would like.  Patient is also complaining of right hip pain. 3 weeks ago patient did hit a side of the table and had significant pain. Seemed to resolve after a couple days but now seems to be worsening. States that all the pain seems to be in the groin on the right side and worse with activity. Denies any swelling, bruising, or any weakness. Patient states though that walking or even jumping on that leg as severe pain. States that lying down seems to do pretty well. Minimal pain with direct palpation. Rates the severity of pain when it occurs a 7 out of 10.  Past Medical History  Diagnosis Date  . Rhinitis, allergic   . Depression   . Hyperlipidemia   . Anxiety   . Pneumonia 10/04/98    out patient   Past Surgical History  Procedure Laterality Date  . Knee surgery  1992    right, loose body removed  . Knee arthroscopy w/ partial medial meniscectomy  2008    with pateller chondroplasty ([facility])  . Joint replacement   2/13///9/13    medial right knee///medial left knee---Dr Jefm Bryant   Social History  Substance Use Topics  . Smoking status: Never Smoker   . Smokeless tobacco: Never Used  . Alcohol Use: Yes     Comment: 2 beers a day in general.   No Known Allergies Family History  Problem Relation Age of Onset  . Heart disease Mother     CAD, MI  . Hypertension Mother   . Stroke Father   . Aneurysm Father     died from rupture of AAA  . Cancer Paternal Grandfather     prostate        Past medical history, social, surgical and family history all reviewed in electronic medical record.   Review of Systems: No headache, visual changes, nausea, vomiting, diarrhea, constipation, dizziness, abdominal pain, skin rash, fevers, chills, night sweats, weight loss, swollen lymph nodes, body aches, joint swelling, muscle aches, chest pain, shortness of breath, mood changes.   Objective Blood pressure 138/86, pulse 77, height 5\' 7"  (1.702 m), weight 199 lb (90.266 kg), SpO2 98 %.  General: No apparent distress alert and oriented x3 mood and affect normal, dressed appropriately.  HEENT: Pupils equal, extraocular movements intact  Respiratory: Patient's speak in full sentences and does not appear short of breath  Cardiovascular: No lower extremity edema, non tender, no erythema  Skin: Warm dry intact with no signs of infection or rash on extremities or on axial skeleton.  Abdomen: Soft nontender  Neuro: Cranial nerves II through XII are intact, neurovascularly intact in all extremities with 2+ DTRs and 2+ pulses.  Lymph: No lymphadenopathy of posterior or anterior cervical chain or axillae bilaterally.  Gait normal with good balance and coordination.  MSK:  Non tender with full range of motion and good stability and symmetric strength and tone of shoulders, elbows, wrist,  and ankles bilaterally.  Hip: Right ROM IR: 15 Deg with severe tenderness in the right groin, ER: 35 Deg, Flexion: 120 Deg,  Extension: 100 Deg, Abduction: 45 Deg, Adduction: 45 Deg Strength IR: 5/5, ER: 5/5, Flexion: 5/5, Extension: 5/5, Abduction: 5/5, Adduction: 4/5 with pain Pelvic alignment unremarkable to inspection and palpation. Standing hip rotation and gait without trendelenburg sign / unsteadiness. Greater trochanter without tenderness to palpation. No tenderness over piriformis and greater trochanter. Severe pain with internal rotation Unable to jump on right leg without severe pain in the groin. Mild sacroiliac joint Contralateral hip unremarkable.  Knee: Left knee Procedure surgical changes noted Mild tenderness noted over the lateral joint line ROM full in flexion and extension and lower leg rotation. Severe laxity of the MCL and LCL bilaterally with clicking noted of the medial compartment. Positive dial. Patellar and quadriceps tendons unremarkable. Hamstring and quadriceps strength is normal.  Contralateral knee unremarkable except for post surgical changes.   MSK US performed of: Left knee This study was ordered, performed, and interpreted by Charlann Boxer D.O.  Knee: Effusion noted Medial compartment does have post surgical changes. Mild to moderate osteophytic changes with lateral joint line narrowing.  IMPRESSION: Knee effusion with medial compartment replacement     Impression and Recommendations:     This case required medical decision making of moderate complexity.

## 2014-10-28 NOTE — Assessment & Plan Note (Signed)
Patient right hip pain I do think is secondary to osteoarthritis. Patient has limitation in range of motion with internal rotation. We discussed with patient about different treatment options. Patient did get home exercises and work with Product/process development scientist. Depending on the amount of arthritis patient may want to be a surgical candidate with possible steroid injections. Differential also has a muscle injury which patient will be treated for with a compression sleeve. Patient will try icing. Patient will try over-the-counter medications. Patient come back and see me again in 3-4 weeks.

## 2014-10-28 NOTE — Patient Instructions (Signed)
Good to see you Ice is your friend Take tylenol 650 mg three times a day is the best evidence based medicine we have for arthritis.  Vitamin D 2000 IU daily Fish oil 2 grams daily.  Tumeric 500mg  twice daily.  Tart cherry extract at night Singulair daily for next 2 weeks  Thigh compression sleeve can help Knee brace will give you stability Exercises 3 times a week.  Set up appointment with me in 3 weeks.

## 2014-10-28 NOTE — Assessment & Plan Note (Signed)
Patient does have some instability as well as some clicking with range of motion of this knee. I do see some mild swelling as well. There is a question of possible loosening. X-rays ordered today. Discussed with patient about proper bracing and patient was given a hinge brace for some mild more stability. We discussed icing. Patient will wear good shoes. Patient will follow-up with the orthopedic surgeon who did the partial previously. Patient may need advanced imaging in the future.

## 2014-10-28 NOTE — Telephone Encounter (Signed)
Entered right hip, cathy made aware.

## 2014-10-28 NOTE — Progress Notes (Signed)
Pre visit review using our clinic review tool, if applicable. No additional management support is needed unless otherwise documented below in the visit note. 

## 2014-10-28 NOTE — Telephone Encounter (Signed)
Cathy from [facility] called regarding pts wife came in or called regarding his xrays. The orders show for knee xrays, but wife states there should be an order for an xray on his right hip. Can you verify this for her Tye [place] can be reached at [phone]

## 2014-10-29 ENCOUNTER — Telehealth: Payer: Self-pay

## 2014-10-29 NOTE — Telephone Encounter (Signed)
Left message to call back for x ray results.

## 2014-11-01 ENCOUNTER — Other Ambulatory Visit: Payer: Self-pay | Admitting: *Deleted

## 2014-11-01 DIAGNOSIS — M2352 Chronic instability of knee, left knee: Secondary | ICD-10-CM

## 2014-11-03 ENCOUNTER — Ambulatory Visit: Payer: PRIVATE HEALTH INSURANCE | Admitting: Family Medicine

## 2014-11-08 ENCOUNTER — Other Ambulatory Visit (HOSPITAL_COMMUNITY): Payer: Self-pay | Admitting: Orthopedic Surgery

## 2014-11-08 DIAGNOSIS — T84033A Mechanical loosening of internal left knee prosthetic joint, initial encounter: Secondary | ICD-10-CM

## 2014-11-09 ENCOUNTER — Encounter (HOSPITAL_COMMUNITY): Payer: PRIVATE HEALTH INSURANCE

## 2014-11-11 ENCOUNTER — Encounter (HOSPITAL_COMMUNITY)
Admission: RE | Admit: 2014-11-11 | Discharge: 2014-11-11 | Disposition: A | Discharge status: Home / Self Care | Payer: PRIVATE HEALTH INSURANCE | Source: Ambulatory Visit | Attending: Orthopedic Surgery | Admitting: Orthopedic Surgery

## 2014-11-11 DIAGNOSIS — M25562 Pain in left knee: Secondary | ICD-10-CM | POA: Diagnosis not present

## 2014-11-11 DIAGNOSIS — T84033A Mechanical loosening of internal left knee prosthetic joint, initial encounter: Secondary | ICD-10-CM

## 2014-11-11 MED ORDER — TECHNETIUM TC 99M MEDRONATE IV KIT
22.1000 | PACK | Freq: Once | INTRAVENOUS | Status: AC | PRN
  Administered 2014-11-11: 22.1 via INTRAVENOUS

## 2014-11-15 ENCOUNTER — Ambulatory Visit: Payer: PRIVATE HEALTH INSURANCE | Admitting: Family Medicine

## 2014-12-21 ENCOUNTER — Encounter: Payer: Self-pay | Admitting: Internal Medicine

## 2014-12-21 ENCOUNTER — Ambulatory Visit (INDEPENDENT_AMBULATORY_CARE_PROVIDER_SITE_OTHER): Payer: PRIVATE HEALTH INSURANCE | Admitting: Internal Medicine

## 2014-12-21 VITALS — BP 152/100 | HR 76 | Temp 97.7°F | Wt 203.0 lb

## 2014-12-21 DIAGNOSIS — F3340 Major depressive disorder, recurrent, in remission, unspecified: Secondary | ICD-10-CM | POA: Diagnosis not present

## 2014-12-21 DIAGNOSIS — R030 Elevated blood-pressure reading, without diagnosis of hypertension: Secondary | ICD-10-CM

## 2014-12-21 DIAGNOSIS — Z0000 Encounter for general adult medical examination without abnormal findings: Secondary | ICD-10-CM

## 2014-12-21 LAB — CBC WITH DIFFERENTIAL/PLATELET
Basophils Absolute: 0 10*3/uL (ref 0.0–0.1)
Basophils Relative: 0.4 % (ref 0.0–3.0)
EOS PCT: 2.3 % (ref 0.0–5.0)
Eosinophils Absolute: 0.1 10*3/uL (ref 0.0–0.7)
HCT: 43.6 % (ref 39.0–52.0)
HEMOGLOBIN: 14.9 g/dL (ref 13.0–17.0)
LYMPHS PCT: 36.1 % (ref 12.0–46.0)
Lymphs Abs: 2.3 10*3/uL (ref 0.7–4.0)
MCHC: 34.3 g/dL (ref 30.0–36.0)
MCV: 87 fl (ref 78.0–100.0)
MONOS PCT: 7.9 % (ref 3.0–12.0)
Monocytes Absolute: 0.5 10*3/uL (ref 0.1–1.0)
Neutro Abs: 3.4 10*3/uL (ref 1.4–7.7)
Neutrophils Relative %: 53.3 % (ref 43.0–77.0)
Platelets: 219 10*3/uL (ref 150.0–400.0)
RBC: 5.01 Mil/uL (ref 4.22–5.81)
RDW: 14.3 % (ref 11.5–15.5)
WBC: 6.5 10*3/uL (ref 4.0–10.5)

## 2014-12-21 LAB — COMPREHENSIVE METABOLIC PANEL
ALBUMIN: 4.2 g/dL (ref 3.5–5.2)
ALK PHOS: 63 U/L (ref 39–117)
ALT: 29 U/L (ref 0–53)
AST: 25 U/L (ref 0–37)
BUN: 19 mg/dL (ref 6–23)
CO2: 27 mEq/L (ref 19–32)
Calcium: 9.4 mg/dL (ref 8.4–10.5)
Chloride: 105 mEq/L (ref 96–112)
Creatinine, Ser: 1.07 mg/dL (ref 0.40–1.50)
GFR: 74.37 mL/min (ref >60.00)
Glucose, Bld: 87 mg/dL (ref 70–99)
POTASSIUM: 4.4 meq/L (ref 3.5–5.1)
Sodium: 139 mEq/L (ref 135–145)
TOTAL PROTEIN: 7.3 g/dL (ref 6.0–8.3)
Total Bilirubin: 0.6 mg/dL (ref 0.2–1.2)

## 2014-12-21 LAB — MICROALBUMIN / CREATININE URINE RATIO
Creatinine,U: 104.7 mg/dL
MICROALB/CREAT RATIO: 0.7 mg/g (ref 0.0–30.0)
Microalb, Ur: <0.7 mg/dL (ref 0.0–1.9)

## 2014-12-21 LAB — T4, FREE: FREE T4: 0.75 ng/dL (ref 0.60–1.60)

## 2014-12-21 NOTE — Progress Notes (Signed)
Pre visit review using our clinic review tool, if applicable. No additional management support is needed unless otherwise documented below in the visit note. 

## 2014-12-21 NOTE — Assessment & Plan Note (Signed)
Most likely related to him using his wife's ibuprofen prescription Will have him stop this---stick with tylenol for his knee and hip pain Consider meds if BP still up at upcoming appt he will have with Dr Tamala Julian Check labs also

## 2014-12-21 NOTE — Progress Notes (Signed)
Subjective:    Patient ID: Brendan LOHMEYER, male    DOB: 23-Oct-1950, 64 y.o.   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 citalopram (CELEXA) 40 MG tablet Take 1 tablet (40 mg total) by mouth daily. 90 tablet 3   No current facility-administered medications on file prior to visit.    No Known Allergies  Past Medical History  Diagnosis Date  . Rhinitis, allergic   . Depression   . Hyperlipidemia   . Anxiety   . Pneumonia 10/04/98    out patient    Past Surgical History  Procedure Laterality Date  . Knee surgery  1992    right, loose body removed  . Knee arthroscopy w/ partial medial meniscectomy  2008    with pateller chondroplasty ([facility])  . Joint replacement  2/13///9/13    medial right knee///medial left knee---Dr Jefm Bryant    Family History  Problem Relation Age of Onset  . Heart disease Mother     CAD, MI  . Hypertension Mother   . Stroke Father   . Aneurysm Father     died from rupture of AAA  . Cancer Paternal Grandfather     prostate    Social History   Social History  . Marital Status: Married    Spouse Name: N/A  . Number of Children: 5  . Years of Education: N/A   Occupational History  . Sales Manager     [employer]   Social History Main Topics  . Smoking status: Never Smoker   . Smokeless tobacco: Never Used  . Alcohol Use: Yes     Comment: 2 beers a day in general.  . Drug Use: No  . Sexual Activity: Not on file   Other Topics Concern  . Not on file   Social History Narrative   2nd marriage.  3 children from first marriage, 2 stepchildren, 2 with  current marriage.   Review of Systems  Constitutional: Negative for fatigue.       Wears seat belt  HENT: Negative for dental problem, hearing loss and tinnitus.        Keeps up with dentist  Eyes: Positive for visual disturbance.       No diplopia or unilateral vision loss Some floaters  Respiratory: Negative for cough, chest tightness and shortness of breath.   Cardiovascular: Negative for chest pain, palpitations and leg swelling.  Gastrointestinal: Negative for nausea, vomiting, abdominal pain, constipation and blood in stool.  Endocrine: Negative for polydipsia and polyuria.  Genitourinary: Negative for urgency and difficulty urinating.       No sexual problems  Musculoskeletal: Positive for arthralgias. Negative for back pain.  Skin: Negative for rash.       No suspicious lesions. Annual derm visit  Allergic/Immunologic: Negative for environmental allergies and immunocompromised state.  Neurological: Negative for dizziness, syncope, weakness, light-headedness and headaches.  Hematological: Negative for adenopathy. Does not bruise/bleed easily.  Psychiatric/Behavioral: Negative for sleep disturbance. The patient is not nervous/anxious.        Objective:   Physical Exam  Constitutional: He is oriented  to person, place, and time. He appears well-developed and well-nourished. No distress.  HENT:  Head: Normocephalic and atraumatic.  Right Ear: External ear normal.  Left Ear: External ear normal.  Mouth/Throat: Oropharynx is clear and moist. No oropharyngeal exudate.  Eyes: Conjunctivae are normal. Pupils are equal, round, and reactive to light.  Neck: Normal range of motion. Neck supple. No thyromegaly present.  Cardiovascular: Normal rate, regular rhythm, normal heart sounds and intact distal pulses.  Exam reveals no gallop.   No murmur heard. Pulmonary/Chest: Effort normal and breath sounds normal. No respiratory distress. He has no wheezes. He has no rales.  Abdominal:  Soft. There is no tenderness.  Musculoskeletal: He exhibits no edema or tenderness.  Lymphadenopathy:    He has no cervical adenopathy.  Neurological: He is alert and oriented to person, place, and time.  Skin: No rash noted. No erythema.  Psychiatric: He has a normal mood and affect. His behavior is normal.          Assessment & Plan:

## 2014-12-21 NOTE — Assessment & Plan Note (Signed)
Healthy but needs to work on fitness Prefers no flu vaccine No PSA after discussion--he consistently doesn't want to do prostate screening Colon due 2020

## 2014-12-21 NOTE — Assessment & Plan Note (Signed)
Still doing well Will continue the med

## 2014-12-22 ENCOUNTER — Encounter: Payer: Self-pay | Admitting: *Deleted

## 2015-01-06 ENCOUNTER — Encounter: Payer: Self-pay | Admitting: Family Medicine

## 2015-01-06 ENCOUNTER — Ambulatory Visit (INDEPENDENT_AMBULATORY_CARE_PROVIDER_SITE_OTHER): Payer: PRIVATE HEALTH INSURANCE | Admitting: Family Medicine

## 2015-01-06 VITALS — BP 130/82 | HR 69 | Ht 67.0 in | Wt 199.0 lb

## 2015-01-06 DIAGNOSIS — M2352 Chronic instability of knee, left knee: Secondary | ICD-10-CM | POA: Diagnosis not present

## 2015-01-06 NOTE — Progress Notes (Signed)
Pre visit review using our clinic review tool, if applicable. No additional management support is needed unless otherwise documented below in the visit note. 

## 2015-01-06 NOTE — Progress Notes (Signed)
[name] [address], [place] [address] Phone: [phone] Subjective:    I'm seeing this patient by the request  of:  [name], MD   CC: Left knee and right hip pain  [id number] Brendan Bell is a 64 y.o. male coming in with complaint of left knee and right hip pain.  Regarding patient's left knee pain he is had this for quite some time. 5 years ago patient did have a partial knee replacement of the medial compartment. He was found to have a knee effusion at last follow-up. Was last seen 3 months ago. Patient states see the orthopedic surgeon. Patient was sent for a bone scan. Patient never followed up after the bone scan. 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 Patient states he did not follow-up with the orthopedic surgeon secondary to more time and possible miscommunication.   Past Medical History  Diagnosis Date  . Rhinitis, allergic   . Depression   . Hyperlipidemia   . Anxiety   . Pneumonia 10/04/98    out patient   Past Surgical History  Procedure Laterality Date  . Knee surgery  1992    right, loose body removed  . Knee arthroscopy w/ partial medial meniscectomy  2008    with pateller chondroplasty ([facility])  . Joint replacement  2/13///9/13    medial right knee///medial left knee---Dr Jefm Bryant   Social History  Substance Use Topics  . Smoking status: Never Smoker   . Smokeless tobacco: Never Used  . Alcohol Use: Yes     Comment: 2 beers a day in general.   No Known Allergies Family History  Problem Relation Age of Onset  . Heart disease Mother     CAD, MI  . Hypertension Mother   . Stroke Father   . Aneurysm Father     died from rupture of AAA  . Cancer Paternal Grandfather     prostate       Past medical history, social, surgical and  family history all reviewed in electronic medical record.   Review of Systems: No headache, visual changes, nausea, vomiting, diarrhea, constipation, dizziness, abdominal pain, skin rash, fevers, chills, night sweats, weight loss, swollen lymph nodes, body aches, joint swelling, muscle aches, chest pain, shortness of breath, mood changes.   Objective Blood pressure 130/82, pulse 69, height 5\' 7"  (1.702 m), weight 199 lb (90.266 kg), SpO2 98 %.  General: No apparent distress alert and oriented x3 mood and affect normal, dressed appropriately.  HEENT: Pupils equal, extraocular movements intact  Respiratory: Patient's speak in full sentences and does not appear short of breath  Cardiovascular: No lower extremity edema, non tender, no erythema  Skin: Warm dry intact with no signs of infection or rash on extremities or on axial skeleton.  Abdomen: Soft nontender  Neuro: Cranial nerves II through XII are intact, neurovascularly intact in all extremities with 2+ DTRs and 2+ pulses.  Lymph: No lymphadenopathy of posterior or anterior cervical chain or axillae bilaterally.  Gait normal with good balance and coordination.  MSK:  Non tender with full range of motion and good stability and symmetric strength and tone of shoulders, elbows, wrist,  and ankles bilaterally.   Knee: Left knee Procedure surgical changes noted Mild tenderness noted over the lateral joint line and does have ROM  full in flexion and extension and lower leg rotation. Severe laxity of the MCL and LCL bilaterally with clicking noted of the medial compartment. Laxity noted. Positive dial test  Patellar and quadriceps tendons unremarkable. Hamstring and quadriceps strength is normal.  Contralateral knee unremarkable except for post surgical changes.        Impression and Recommendations:     This case required medical decision making of moderate complexity.

## 2015-01-06 NOTE — Assessment & Plan Note (Signed)
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 This is significant instability with patient having no radiation partial prosthesis. Patient bone scan also shows signs consistent with prosthetic loosening.  Discussed with Mayer Camel MD.  And he will see him to discuss possible surgical intervention. Discussed with patient and answered all questions.  Spent  25 minutes with patient face-to-face and had greater than 50% of counseling including as described above in assessment and plan.

## 2015-01-06 NOTE — Patient Instructions (Signed)
Verbal instructoins given and patient sent to Dr. Mayer Camel.

## 2015-01-10 ENCOUNTER — Other Ambulatory Visit: Payer: Self-pay | Admitting: Internal Medicine

## 2015-01-18 ENCOUNTER — Other Ambulatory Visit: Payer: Self-pay | Admitting: Orthopedic Surgery

## 2015-02-09 NOTE — Pre-Procedure Instructions (Signed)
    Brendan Bell  02/09/2015      [id number] [name], [name] [address] Phone: [phone] Fax: [phone]    Your procedure is scheduled on Monday, January 23rd, 2017.  Report to [facility] Admitting at 8:00 A.M.  Call this number if you have problems the morning of surgery:  [phone]   Remember:  Do not eat food or drink liquids after midnight.   Take these medicines the morning of surgery with A SIP OF WATER: Citalopram (Celexa).    Do not wear jewelry.  Do not wear lotions, powders, or colognes.  You may wear deodorant.  Men may shave face and neck.  Do not bring valuables to the hospital.  [facility] is not responsible for any belongings or valuables.  Contacts, dentures or bridgework may not be worn into surgery.  Leave your suitcase in the car.  After surgery it may be brought to your room.  For patients admitted to the hospital, discharge time will be determined by your treatment team.  Patients discharged the day of surgery will not be allowed to drive home.   Special instructions:  See attached.   Please read over the following fact sheets that you were given. Pain Booklet, Coughing and Deep Breathing, Blood Transfusion Information, MRSA Information and Surgical Site Infection Prevention

## 2015-02-10 ENCOUNTER — Encounter (HOSPITAL_COMMUNITY)
Admission: RE | Admit: 2015-02-10 | Discharge: 2015-02-10 | Disposition: A | Discharge status: Home / Self Care | Payer: PRIVATE HEALTH INSURANCE | Source: Ambulatory Visit | Attending: Orthopedic Surgery | Admitting: Orthopedic Surgery

## 2015-02-10 ENCOUNTER — Encounter (HOSPITAL_COMMUNITY): Payer: Self-pay

## 2015-02-10 DIAGNOSIS — R918 Other nonspecific abnormal finding of lung field: Secondary | ICD-10-CM | POA: Diagnosis not present

## 2015-02-10 DIAGNOSIS — Z0183 Encounter for blood typing: Secondary | ICD-10-CM | POA: Insufficient documentation

## 2015-02-10 DIAGNOSIS — E785 Hyperlipidemia, unspecified: Secondary | ICD-10-CM | POA: Insufficient documentation

## 2015-02-10 DIAGNOSIS — I517 Cardiomegaly: Secondary | ICD-10-CM | POA: Diagnosis not present

## 2015-02-10 DIAGNOSIS — Z01818 Encounter for other preprocedural examination: Secondary | ICD-10-CM | POA: Diagnosis not present

## 2015-02-10 DIAGNOSIS — Z01812 Encounter for preprocedural laboratory examination: Secondary | ICD-10-CM | POA: Diagnosis not present

## 2015-02-10 HISTORY — DX: Unspecified osteoarthritis, unspecified site: M19.90

## 2015-02-10 LAB — CBC WITH DIFFERENTIAL/PLATELET
BASOS ABS: 0 10*3/uL (ref 0.0–0.1)
Basophils Relative: 1 %
Eosinophils Absolute: 0.1 10*3/uL (ref 0.0–0.7)
Eosinophils Relative: 2 %
HEMATOCRIT: 45.4 % (ref 39.0–52.0)
Hemoglobin: 16.1 g/dL (ref 13.0–17.0)
LYMPHS ABS: 2.7 10*3/uL (ref 0.7–4.0)
Lymphocytes Relative: 41 %
MCH: 31.1 pg (ref 26.0–34.0)
MCHC: 35.5 g/dL (ref 30.0–36.0)
MCV: 87.6 fL (ref 78.0–100.0)
MONO ABS: 0.6 10*3/uL (ref 0.1–1.0)
Monocytes Relative: 8 %
NEUTROS ABS: 3.2 10*3/uL (ref 1.7–7.7)
Neutrophils Relative %: 48 %
Platelets: 195 10*3/uL (ref 150–400)
RBC: 5.18 MIL/uL (ref 4.22–5.81)
RDW: 13.6 % (ref 11.5–15.5)
WBC: 6.6 10*3/uL (ref 4.0–10.5)

## 2015-02-10 LAB — URINALYSIS, ROUTINE W REFLEX MICROSCOPIC
Glucose, UA: NEGATIVE mg/dL
HGB URINE DIPSTICK: NEGATIVE
Ketones, ur: NEGATIVE mg/dL
Leukocytes, UA: NEGATIVE
Nitrite: NEGATIVE
PROTEIN: NEGATIVE mg/dL
Specific Gravity, Urine: 1.037 — ABNORMAL HIGH (ref 1.005–1.030)
pH: 5.5 (ref 5.0–8.0)

## 2015-02-10 LAB — PROTIME-INR
INR: 1.13 (ref 0.00–1.49)
Prothrombin Time: 14.6 seconds (ref 11.6–15.2)

## 2015-02-10 LAB — BASIC METABOLIC PANEL
Anion gap: 10 (ref 5–15)
BUN: 22 mg/dL — ABNORMAL HIGH (ref 6–20)
CHLORIDE: 107 mmol/L (ref 101–111)
CO2: 22 mmol/L (ref 22–32)
Calcium: 9.5 mg/dL (ref 8.9–10.3)
Creatinine, Ser: 0.96 mg/dL (ref 0.61–1.24)
GFR calc Af Amer: >60 mL/min (ref >60)
GLUCOSE: 87 mg/dL (ref 65–99)
POTASSIUM: 4.3 mmol/L (ref 3.5–5.1)
Sodium: 139 mmol/L (ref 135–145)

## 2015-02-10 LAB — TYPE AND SCREEN
ABO/RH(D): A POS
ANTIBODY SCREEN: NEGATIVE

## 2015-02-10 LAB — ABO/RH: ABO/RH(D): A POS

## 2015-02-10 LAB — SURGICAL PCR SCREEN
MRSA, PCR: NEGATIVE
Staphylococcus aureus: POSITIVE — AB

## 2015-02-10 LAB — APTT: APTT: 35 s (ref 24–37)

## 2015-02-10 NOTE — Progress Notes (Signed)
   02/10/15 0917  OBSTRUCTIVE SLEEP APNEA  Have you ever been diagnosed with sleep apnea through a sleep study? No  Do you snore loudly (loud enough to be heard through closed doors)?  1  Do you often feel tired, fatigued, or sleepy during the daytime (such as falling asleep during driving or talking to someone)? 0  Has anyone observed you stop breathing during your sleep? 1  Do you have, or are you being treated for high blood pressure? 0  BMI more than 35 kg/m2? 0  Age > 50 (1-yes) 1  Neck circumference greater than:Male 16 inches or larger, Male 17inches or larger? 1  Male Gender (Yes=1) 1  Obstructive Sleep Apnea Score 5

## 2015-02-10 NOTE — Progress Notes (Signed)
Anesthesia Chart Review:  Pt is 65 year old male scheduled for L total knee arthroplasty with revision components on 02/21/2015 with Dr. Mayer Camel.   PCP is Dr. [name].   PMH includes:  Hyperlipidemia. Never smoker. BMI 31.5  Preoperative labs reviewed.    Chest x-ray 02/10/15 reviewed.  1.Low lung volumes with mild basilar atelectasis. 2. Mild cardiomegaly. No pulmonary venous congestion.  EKG 02/10/15: NSR. Cannot rule out Anterior infarct, age undetermined  If no changes, I anticipate pt can proceed with surgery as scheduled.   Willeen Cass, FNP-BC [facility]Anesthesiology Phone: [phone] 02/10/2015 3:25 PM

## 2015-02-10 NOTE — Progress Notes (Signed)
Patient made aware of positive PCR results.  [facility] [address] [facility] [place].  Patient verbalizes understanding.

## 2015-02-10 NOTE — Progress Notes (Signed)
Patient refuses to have urinary catheter placed during surgery.  Patient encouraged to speak with Dr. Mayer Camel prior to surgery about concerns.

## 2015-02-10 NOTE — Progress Notes (Signed)
PCP - Dr. [name] Cardiologist - denies  EKG-02/10/15 CXR - 02/10/15  Echo/stress test/cardiac cath - denies  Patient denies chest pain and shortness of breath at PAT appointment.

## 2015-02-17 NOTE — H&P (Signed)
TOTAL KNEE REVISION ADMISSION H&P  Patient is being admitted for left revision total knee arthroplasty.  Subjective:  Chief Complaint:left knee pain.  HPI: Brendan Bell, 65 y.o. 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 The indications for the revision of the total knee arthroplasty are loosening of one or more components. Onset of symptoms was gradual starting 1 years ago with gradually worsening course since that time.  Prior procedures on the left knee(s) include unicompartmental arthroplasty.  Patient currently rates pain in the left knee(s) at 10 out of 10 with activity. There is night pain, worsening of pain with activity and weight bearing, pain that interferes with activities of daily living, pain with passive range of motion, crepitus and joint swelling.  Patient has evidence of prosthetic loosening by imaging studies. This condition presents safety issues increasing the risk of falls.  There is no current active infection.  Patient Active Problem List   Diagnosis Date Noted  . Elevated blood pressure reading without diagnosis of hypertension 12/21/2014  . Recurrent left knee instability 10/28/2014  . Right hip pain 10/28/2014  . Osteoarthritis of both knees 10/16/2011  . Routine general medical examination at a health care facility 06/19/2010  . Hyperlipemia 02/20/2007  . Recurrent major depression in remission ([place]) 02/20/2007  . ALLERGIC RHINITIS 02/20/2007   Past Medical History  Diagnosis Date  . Rhinitis, allergic   . Depression   . Hyperlipidemia   . Anxiety   . Pneumonia 10/04/98    out patient  . Osteoarthritis     Past Surgical History  Procedure Laterality Date  . Knee  surgery  1992    right, loose body removed  . Knee arthroscopy w/ partial medial meniscectomy Right 2008    with pateller chondroplasty ([facility])  . Joint replacement  2/13///9/13    medial right knee///medial left knee---Dr Jefm Bryant  . Knee surgery      multiple surgeries to both knees  . Medial partial knee replacement Left   . Medial partial knee replacement Right   . Tonsillectomy    . Eye surgery Bilateral   . Colonoscopy      No prescriptions prior to admission   No Known Allergies  Social History  Substance Use Topics  . Smoking status: Never Smoker   . Smokeless tobacco: Never Used  . Alcohol Use: Yes     Comment: 2 beers a day in general.    Family History  Problem Relation Age of Onset  . Heart disease Mother     CAD, MI  . Hypertension Mother   . Stroke Father   . Aneurysm Father     died from rupture of AAA  . Cancer Paternal Grandfather     prostate      Review of Systems  Constitutional: Negative.   HENT: Negative.   Eyes: Negative.   Respiratory: Negative.   Cardiovascular: Negative.   Gastrointestinal: Negative.   Genitourinary:       Penis discharge?  Musculoskeletal: Positive for joint pain.  Skin: Negative.   Neurological: Negative.   Endo/Heme/Allergies: Negative.   Psychiatric/Behavioral: Positive for depression. The patient is nervous/anxious.      Objective:  Physical Exam  Constitutional: He is oriented to person, place, and time. He appears well-developed and well-nourished.  HENT:  Head: Normocephalic and atraumatic.  Eyes: Pupils are equal, round, and reactive to light.  Neck: Normal range of motion. Neck supple.  Cardiovascular: Intact distal pulses.   Respiratory: Effort normal.  Musculoskeletal: He exhibits tenderness.  Patient is tender along the medial joint line of his left knee.  There is one plus effusion.  Range of motion is from 5-120.  Collateral ligaments are stable.    Neurological: He is alert and oriented to  person, place, and time.  Skin: Skin is warm and dry.  Psychiatric: He has a normal mood and affect. His behavior is normal. Judgment and thought content normal.    Vital signs in last 24 hours:    Labs:  Estimated body mass index is 31.16 kg/(m^2) as calculated from the following:   Height as of 01/06/15: 5\' 7"  (1.702 m).   Weight as of 01/06/15: 90.266 kg (199 lb).  Imaging Review Plain radiographs demonstrate AP, Rosenberg, lateral and sunrise x-rays show obvious windshield wiper ring of the femoral component of the posterior condyle.  There is a 3 mm gap between the posterior condyle and the posterior flange of the femoral component.  Assessment/Plan:  Loose unicompartmental left total knee arthroplasty  The patient history, physical examination, clinical judgment of the provider and imaging studies are consistent with end stage degenerative joint disease of the left knee(s), previous total knee arthroplasty. Revision total knee arthroplasty is deemed medically necessary. The treatment options including medical management, injection therapy, arthroscopy and revision arthroplasty were discussed at length. The risks and benefits of revision total knee arthroplasty were presented and reviewed. The risks due to aseptic loosening, infection, stiffness, patella tracking problems, thromboembolic complications and other imponderables were discussed. The patient acknowledged the explanation, agreed to proceed with the plan and consent was signed. Patient is being admitted for inpatient treatment for surgery, pain control, PT, OT, prophylactic antibiotics, VTE prophylaxis, progressive ambulation and ADL's and discharge planning.The patient is planning to be discharged home with home health services

## 2015-02-18 MED ORDER — CHLORHEXIDINE GLUCONATE 4 % EX LIQD: 60.0000 mL | Freq: Once | CUTANEOUS | Status: DC

## 2015-02-18 MED ORDER — DEXTROSE-NACL 5-0.45 % IV SOLN: INTRAVENOUS | Status: DC

## 2015-02-18 MED ORDER — DEXTROSE 5 % IV SOLN
3.0000 g | INTRAVENOUS | Status: AC
  Administered 2015-02-21: 3 g via INTRAVENOUS
  Filled 2015-02-18: qty 3000

## 2015-02-20 DIAGNOSIS — Z96659 Presence of unspecified artificial knee joint: Secondary | ICD-10-CM

## 2015-02-20 DIAGNOSIS — T84038A Mechanical loosening of other internal prosthetic joint, initial encounter: Secondary | ICD-10-CM

## 2015-02-20 MED ORDER — SODIUM CHLORIDE 0.9 % IV SOLN
1000.0000 mg | INTRAVENOUS | Status: AC
  Administered 2015-02-21: 1000 mg via INTRAVENOUS
  Filled 2015-02-20: qty 10

## 2015-02-20 MED ORDER — BUPIVACAINE LIPOSOME 1.3 % IJ SUSP
20.0000 mL | Freq: Once | INTRAMUSCULAR | Status: AC
  Administered 2015-02-21: 20 mL
  Filled 2015-02-20: qty 20

## 2015-02-21 ENCOUNTER — Encounter (HOSPITAL_COMMUNITY)
Admission: AD | Disposition: A | Discharge status: Home Health | Payer: Self-pay | Source: Ambulatory Visit | Attending: Orthopedic Surgery

## 2015-02-21 ENCOUNTER — Inpatient Hospital Stay (HOSPITAL_COMMUNITY): Payer: PRIVATE HEALTH INSURANCE | Admitting: Anesthesiology

## 2015-02-21 ENCOUNTER — Encounter (HOSPITAL_COMMUNITY): Payer: Self-pay | Admitting: *Deleted

## 2015-02-21 ENCOUNTER — Inpatient Hospital Stay (HOSPITAL_COMMUNITY)
Admission: AD | Admit: 2015-02-21 | Discharge: 2015-02-22 | DRG: 468 | Disposition: A | Discharge status: Home Health | Payer: PRIVATE HEALTH INSURANCE | Source: Ambulatory Visit | Attending: Orthopedic Surgery | Admitting: Orthopedic Surgery

## 2015-02-21 ENCOUNTER — Inpatient Hospital Stay (HOSPITAL_COMMUNITY): Payer: PRIVATE HEALTH INSURANCE | Admitting: Emergency Medicine

## 2015-02-21 DIAGNOSIS — T84033A Mechanical loosening of internal left knee prosthetic joint, initial encounter: Principal | ICD-10-CM | POA: Diagnosis present

## 2015-02-21 DIAGNOSIS — M1710 Unilateral primary osteoarthritis, unspecified knee: Secondary | ICD-10-CM | POA: Diagnosis present

## 2015-02-21 DIAGNOSIS — E785 Hyperlipidemia, unspecified: Secondary | ICD-10-CM | POA: Diagnosis present

## 2015-02-21 DIAGNOSIS — Z96659 Presence of unspecified artificial knee joint: Secondary | ICD-10-CM

## 2015-02-21 DIAGNOSIS — F329 Major depressive disorder, single episode, unspecified: Secondary | ICD-10-CM | POA: Diagnosis present

## 2015-02-21 DIAGNOSIS — Y831 Surgical operation with implant of artificial internal device as the cause of abnormal reaction of the patient, or of later complication, without mention of misadventure at the time of the procedure: Secondary | ICD-10-CM | POA: Diagnosis present

## 2015-02-21 DIAGNOSIS — T84038A Mechanical loosening of other internal prosthetic joint, initial encounter: Secondary | ICD-10-CM

## 2015-02-21 DIAGNOSIS — M25562 Pain in left knee: Secondary | ICD-10-CM | POA: Diagnosis present

## 2015-02-21 DIAGNOSIS — Z96651 Presence of right artificial knee joint: Secondary | ICD-10-CM | POA: Diagnosis present

## 2015-02-21 HISTORY — PX: TOTAL KNEE ARTHROPLASTY WITH REVISION COMPONENTS: SHX6198

## 2015-02-21 SURGERY — TOTAL KNEE ARTHROPLASTY WITH REVISION COMPONENTS
Anesthesia: Spinal | Laterality: Left

## 2015-02-21 MED ORDER — METOCLOPRAMIDE HCL 5 MG/ML IJ SOLN: 5.0000 mg | Freq: Three times a day (TID) | INTRAMUSCULAR | Status: DC | PRN

## 2015-02-21 MED ORDER — CITALOPRAM HYDROBROMIDE 40 MG PO TABS
40.0000 mg | ORAL_TABLET | Freq: Every day | ORAL | Status: DC
  Administered 2015-02-22: 40 mg via ORAL

## 2015-02-21 MED ORDER — TRANEXAMIC ACID 1000 MG/10ML IV SOLN
2000.0000 mg | INTRAVENOUS | Status: AC
  Administered 2015-02-21: 2000 mg via TOPICAL
  Filled 2015-02-21: qty 20

## 2015-02-21 MED ORDER — KCL IN DEXTROSE-NACL 20-5-0.45 MEQ/L-%-% IV SOLN
INTRAVENOUS | Status: AC
  Filled 2015-02-21: qty 1000

## 2015-02-21 MED ORDER — HYDROMORPHONE HCL 1 MG/ML IJ SOLN: 0.2500 mg | INTRAMUSCULAR | Status: DC | PRN

## 2015-02-21 MED ORDER — MIDAZOLAM HCL 2 MG/2ML IJ SOLN
INTRAMUSCULAR | Status: AC
  Administered 2015-02-21: 2 mg
  Filled 2015-02-21: qty 2

## 2015-02-21 MED ORDER — 0.9 % SODIUM CHLORIDE (POUR BTL) OPTIME
TOPICAL | Status: DC | PRN
  Administered 2015-02-21: 1000 mL

## 2015-02-21 MED ORDER — BUPIVACAINE LIPOSOME 1.3 % IJ SUSP
20.0000 mL | INTRAMUSCULAR | Status: DC
  Filled 2015-02-21: qty 20

## 2015-02-21 MED ORDER — FENTANYL CITRATE (PF) 100 MCG/2ML IJ SOLN
INTRAMUSCULAR | Status: AC
  Administered 2015-02-21: 100 ug via INTRAVENOUS
  Filled 2015-02-21: qty 2

## 2015-02-21 MED ORDER — BUPIVACAINE HCL 0.5 % IJ SOLN
INTRAMUSCULAR | Status: DC | PRN
  Administered 2015-02-21: 30 mL

## 2015-02-21 MED ORDER — PROMETHAZINE HCL 25 MG/ML IJ SOLN: 6.2500 mg | INTRAMUSCULAR | Status: DC | PRN

## 2015-02-21 MED ORDER — HYDROMORPHONE HCL 1 MG/ML IJ SOLN
1.0000 mg | INTRAMUSCULAR | Status: DC | PRN
  Administered 2015-02-21 (×2): 1 mg via INTRAVENOUS
  Filled 2015-02-21 (×2): qty 1

## 2015-02-21 MED ORDER — PHENOL 1.4 % MT LIQD
1.0000 | OROMUCOSAL | Status: DC | PRN
Start: 2015-02-21 — End: 2015-02-22

## 2015-02-21 MED ORDER — BUPIVACAINE HCL (PF) 0.5 % IJ SOLN
INTRAMUSCULAR | Status: DC | PRN
  Administered 2015-02-21: 2 mL via INTRATHECAL

## 2015-02-21 MED ORDER — SENNOSIDES-DOCUSATE SODIUM 8.6-50 MG PO TABS: 1.0000 | ORAL_TABLET | Freq: Every evening | ORAL | Status: DC | PRN

## 2015-02-21 MED ORDER — PROPOFOL 500 MG/50ML IV EMUL
INTRAVENOUS | Status: DC | PRN
  Administered 2015-02-21: 50 ug/kg/min via INTRAVENOUS

## 2015-02-21 MED ORDER — ASPIRIN EC 325 MG PO TBEC
325.0000 mg | DELAYED_RELEASE_TABLET | Freq: Every day | ORAL | Status: DC
  Administered 2015-02-22: 325 mg via ORAL
  Filled 2015-02-21: qty 1

## 2015-02-21 MED ORDER — LACTATED RINGERS IV SOLN
Freq: Once | INTRAVENOUS | Status: AC
  Administered 2015-02-21: 09:00:00 via INTRAVENOUS

## 2015-02-21 MED ORDER — ONDANSETRON HCL 4 MG/2ML IJ SOLN
INTRAMUSCULAR | Status: DC | PRN
Start: 2015-02-21 — End: 2015-02-21
  Administered 2015-02-21: 4 mg via INTRAVENOUS

## 2015-02-21 MED ORDER — METHOCARBAMOL 500 MG PO TABS
500.0000 mg | ORAL_TABLET | Freq: Four times a day (QID) | ORAL | Status: DC | PRN
  Administered 2015-02-21 – 2015-02-22 (×4): 500 mg via ORAL
  Filled 2015-02-21 (×4): qty 1

## 2015-02-21 MED ORDER — OXYCODONE HCL 5 MG PO TABS
5.0000 mg | ORAL_TABLET | ORAL | Status: DC | PRN
  Administered 2015-02-21 – 2015-02-22 (×7): 10 mg via ORAL
  Filled 2015-02-21 (×7): qty 2

## 2015-02-21 MED ORDER — MIDAZOLAM HCL 5 MG/ML IJ SOLN: 2.0000 mg | Freq: Once | INTRAMUSCULAR | Status: DC

## 2015-02-21 MED ORDER — FENTANYL CITRATE (PF) 250 MCG/5ML IJ SOLN
INTRAMUSCULAR | Status: AC
  Filled 2015-02-21: qty 5

## 2015-02-21 MED ORDER — BUPIVACAINE HCL (PF) 0.5 % IJ SOLN
INTRAMUSCULAR | Status: AC
  Filled 2015-02-21: qty 30

## 2015-02-21 MED ORDER — FENTANYL CITRATE (PF) 100 MCG/2ML IJ SOLN
100.0000 ug | Freq: Once | INTRAMUSCULAR | Status: AC
  Administered 2015-02-21: 100 ug via INTRAVENOUS

## 2015-02-21 MED ORDER — ALUM & MAG HYDROXIDE-SIMETH 200-200-20 MG/5ML PO SUSP: 30.0000 mL | ORAL | Status: DC | PRN

## 2015-02-21 MED ORDER — ACETAMINOPHEN 650 MG RE SUPP: 650.0000 mg | Freq: Four times a day (QID) | RECTAL | Status: DC | PRN

## 2015-02-21 MED ORDER — ONDANSETRON HCL 4 MG PO TABS: 4.0000 mg | ORAL_TABLET | Freq: Four times a day (QID) | ORAL | Status: DC | PRN

## 2015-02-21 MED ORDER — ONDANSETRON HCL 4 MG/2ML IJ SOLN
4.0000 mg | Freq: Four times a day (QID) | INTRAMUSCULAR | Status: DC | PRN
Start: 2015-02-21 — End: 2015-02-22
  Administered 2015-02-22: 4 mg via INTRAVENOUS
  Filled 2015-02-21: qty 2

## 2015-02-21 MED ORDER — MENTHOL 3 MG MT LOZG: 1.0000 | LOZENGE | OROMUCOSAL | Status: DC | PRN

## 2015-02-21 MED ORDER — SODIUM CHLORIDE 0.9 % IV SOLN
1000.0000 mg | INTRAVENOUS | Status: DC
  Filled 2015-02-21: qty 10

## 2015-02-21 MED ORDER — BUPIVACAINE HCL (PF) 0.75 % IJ SOLN
INTRAMUSCULAR | Status: DC | PRN
  Administered 2015-02-21: 2 mL via INTRATHECAL

## 2015-02-21 MED ORDER — TRANEXAMIC ACID 650 MG PO TABS
1300.0000 mg | ORAL_TABLET | Freq: Once | ORAL | Status: AC
  Administered 2015-02-21: 1300 mg via ORAL
  Filled 2015-02-21 (×2): qty 2

## 2015-02-21 MED ORDER — METHOCARBAMOL 1000 MG/10ML IJ SOLN
500.0000 mg | Freq: Four times a day (QID) | INTRAVENOUS | Status: DC | PRN
  Filled 2015-02-21: qty 5

## 2015-02-21 MED ORDER — TAMSULOSIN HCL 0.4 MG PO CAPS
0.4000 mg | ORAL_CAPSULE | Freq: Every day | ORAL | Status: DC
  Administered 2015-02-21: 0.4 mg via ORAL
  Filled 2015-02-21: qty 1

## 2015-02-21 MED ORDER — CEFUROXIME SODIUM 1.5 G IJ SOLR
INTRAMUSCULAR | Status: AC
  Filled 2015-02-21: qty 1.5

## 2015-02-21 MED ORDER — SODIUM CHLORIDE 0.9 % IJ SOLN
INTRAMUSCULAR | Status: DC | PRN
  Administered 2015-02-21 (×2): 10 mL

## 2015-02-21 MED ORDER — SODIUM CHLORIDE 0.9 % IR SOLN
Status: DC | PRN
  Administered 2015-02-21: 1000 mL

## 2015-02-21 MED ORDER — DIPHENHYDRAMINE HCL 12.5 MG/5ML PO ELIX: 12.5000 mg | ORAL_SOLUTION | ORAL | Status: DC | PRN

## 2015-02-21 MED ORDER — KCL IN DEXTROSE-NACL 20-5-0.45 MEQ/L-%-% IV SOLN
INTRAVENOUS | Status: DC
  Administered 2015-02-21 – 2015-02-22 (×2): via INTRAVENOUS
  Filled 2015-02-21 (×2): qty 1000

## 2015-02-21 MED ORDER — LACTATED RINGERS IV SOLN
INTRAVENOUS | Status: DC | PRN
  Administered 2015-02-21: 10:00:00 via INTRAVENOUS

## 2015-02-21 MED ORDER — ACETAMINOPHEN 325 MG PO TABS
650.0000 mg | ORAL_TABLET | Freq: Four times a day (QID) | ORAL | Status: DC | PRN
  Administered 2015-02-21 – 2015-02-22 (×2): 650 mg via ORAL
  Filled 2015-02-21 (×2): qty 2

## 2015-02-21 MED ORDER — CEFUROXIME SODIUM 1.5 G IJ SOLR
INTRAMUSCULAR | Status: DC | PRN
  Administered 2015-02-21: 1.5 g

## 2015-02-21 MED ORDER — DOCUSATE SODIUM 100 MG PO CAPS
100.0000 mg | ORAL_CAPSULE | Freq: Two times a day (BID) | ORAL | Status: DC
  Administered 2015-02-21 – 2015-02-22 (×3): 100 mg via ORAL
  Filled 2015-02-21 (×3): qty 1

## 2015-02-21 MED ORDER — FLEET ENEMA 7-19 GM/118ML RE ENEM: 1.0000 | ENEMA | Freq: Once | RECTAL | Status: DC | PRN

## 2015-02-21 MED ORDER — OXYCODONE-ACETAMINOPHEN 5-325 MG PO TABS: 1.0000 | ORAL_TABLET | ORAL | Status: DC | PRN

## 2015-02-21 MED ORDER — TIZANIDINE HCL 2 MG PO TABS: 2.0000 mg | ORAL_TABLET | Freq: Four times a day (QID) | ORAL | Status: DC | PRN

## 2015-02-21 MED ORDER — BISACODYL 5 MG PO TBEC: 5.0000 mg | DELAYED_RELEASE_TABLET | Freq: Every day | ORAL | Status: DC | PRN

## 2015-02-21 MED ORDER — ASPIRIN EC 325 MG PO TBEC: 325.0000 mg | DELAYED_RELEASE_TABLET | Freq: Two times a day (BID) | ORAL | Status: DC

## 2015-02-21 MED ORDER — METOCLOPRAMIDE HCL 5 MG PO TABS: 5.0000 mg | ORAL_TABLET | Freq: Three times a day (TID) | ORAL | Status: DC | PRN

## 2015-02-21 SURGICAL SUPPLY — 59 items
BANDAGE ACE 6X5 VEL STRL LF (GAUZE/BANDAGES/DRESSINGS) ×2 IMPLANT
BANDAGE ESMARK 6X9 LF (GAUZE/BANDAGES/DRESSINGS) ×1 IMPLANT
BLADE SAG 18X100X1.27 (BLADE) ×2 IMPLANT
BLADE SAW SGTL 13X75X1.27 (BLADE) ×2 IMPLANT
BLADE SURG ROTATE 9660 (MISCELLANEOUS) IMPLANT
BNDG ELASTIC 6X10 VLCR STRL LF (GAUZE/BANDAGES/DRESSINGS) ×2 IMPLANT
BNDG ESMARK 6X9 LF (GAUZE/BANDAGES/DRESSINGS) ×2
BOWL SMART MIX CTS (DISPOSABLE) ×2 IMPLANT
CAPT KNEE TOTAL 3 ATTUNE ×2 IMPLANT
CEMENT HV SMART SET (Cement) ×4 IMPLANT
COVER SURGICAL LIGHT HANDLE (MISCELLANEOUS) ×2 IMPLANT
CUFF TOURNIQUET SINGLE 34IN LL (TOURNIQUET CUFF) ×2 IMPLANT
CUFF TOURNIQUET SINGLE 44IN (TOURNIQUET CUFF) IMPLANT
DRAPE EXTREMITY T 121X128X90 (DRAPE) ×2 IMPLANT
DRAPE U-SHAPE 47X51 STRL (DRAPES) ×2 IMPLANT
DURAPREP 26ML APPLICATOR (WOUND CARE) ×4 IMPLANT
ELECT REM PT RETURN 9FT ADLT (ELECTROSURGICAL) ×2
ELECTRODE REM PT RTRN 9FT ADLT (ELECTROSURGICAL) ×1 IMPLANT
EVACUATOR 1/8 PVC DRAIN (DRAIN) IMPLANT
GAUZE SPONGE 4X4 12PLY STRL (GAUZE/BANDAGES/DRESSINGS) ×4 IMPLANT
GAUZE XEROFORM 1X8 LF (GAUZE/BANDAGES/DRESSINGS) ×2 IMPLANT
GLOVE BIO SURGEON STRL SZ7.5 (GLOVE) ×2 IMPLANT
GLOVE BIO SURGEON STRL SZ8.5 (GLOVE) ×2 IMPLANT
GLOVE BIOGEL PI IND STRL 8 (GLOVE) ×2 IMPLANT
GLOVE BIOGEL PI IND STRL 9 (GLOVE) ×1 IMPLANT
GLOVE BIOGEL PI INDICATOR 8 (GLOVE) ×2
GLOVE BIOGEL PI INDICATOR 9 (GLOVE) ×1
GLOVE SURG SS PI 7.5 STRL IVOR (GLOVE) ×2 IMPLANT
GOWN STRL REUS W/ TWL LRG LVL3 (GOWN DISPOSABLE) ×1 IMPLANT
GOWN STRL REUS W/ TWL XL LVL3 (GOWN DISPOSABLE) ×2 IMPLANT
GOWN STRL REUS W/TWL LRG LVL3 (GOWN DISPOSABLE) ×1
GOWN STRL REUS W/TWL XL LVL3 (GOWN DISPOSABLE) ×2
HANDPIECE INTERPULSE COAX TIP (DISPOSABLE) ×1
HOOD PEEL AWAY FACE SHEILD DIS (HOOD) ×4 IMPLANT
KIT BASIN OR (CUSTOM PROCEDURE TRAY) ×2 IMPLANT
KIT ROOM TURNOVER OR (KITS) ×2 IMPLANT
MANIFOLD NEPTUNE II (INSTRUMENTS) ×2 IMPLANT
NEEDLE SPNL 18GX3.5 QUINCKE PK (NEEDLE) IMPLANT
NS IRRIG 1000ML POUR BTL (IV SOLUTION) ×2 IMPLANT
PACK TOTAL JOINT (CUSTOM PROCEDURE TRAY) ×2 IMPLANT
PACK UNIVERSAL I (CUSTOM PROCEDURE TRAY) ×2 IMPLANT
PAD ABD 8X10 STRL (GAUZE/BANDAGES/DRESSINGS) ×2 IMPLANT
PAD ARMBOARD 7.5X6 YLW CONV (MISCELLANEOUS) ×4 IMPLANT
PADDING CAST COTTON 6X4 STRL (CAST SUPPLIES) ×2 IMPLANT
SET HNDPC FAN SPRY TIP SCT (DISPOSABLE) ×1 IMPLANT
SPONGE GAUZE 4X4 12PLY STER LF (GAUZE/BANDAGES/DRESSINGS) ×2 IMPLANT
SUT VIC AB 0 CT1 27 (SUTURE) ×1
SUT VIC AB 0 CT1 27XBRD ANBCTR (SUTURE) ×1 IMPLANT
SUT VIC AB 1 CTX 36 (SUTURE) ×1
SUT VIC AB 1 CTX36XBRD ANBCTR (SUTURE) ×1 IMPLANT
SUT VIC AB 2-0 CT1 27 (SUTURE)
SUT VIC AB 2-0 CT1 TAPERPNT 27 (SUTURE) IMPLANT
SUT VIC AB 3-0 CT1 27 (SUTURE) ×1
SUT VIC AB 3-0 CT1 TAPERPNT 27 (SUTURE) ×1 IMPLANT
SYR 50ML LL SCALE MARK (SYRINGE) ×2 IMPLANT
TOWEL OR 17X24 6PK STRL BLUE (TOWEL DISPOSABLE) ×2 IMPLANT
TOWEL OR 17X26 10 PK STRL BLUE (TOWEL DISPOSABLE) ×2 IMPLANT
TRAY CATH 16FR W/PLASTIC CATH (SET/KITS/TRAYS/PACK) IMPLANT
WATER STERILE IRR 1000ML POUR (IV SOLUTION) IMPLANT

## 2015-02-21 NOTE — Progress Notes (Signed)
Orthopedic Tech Progress Note Patient Details:  Brendan Bell [birth year] [id number]  Ortho Devices Ortho Device/Splint Location: foot roll Ortho Device/Splint Interventions: Veleta Miners 02/21/2015, 5:40 PM

## 2015-02-21 NOTE — Op Note (Signed)
PATIENT ID:      Brendan Bell  MRN:     [id number] DOB/AGE:    1950/07/13 / 65 y.o.       OPERATIVE REPORT    DATE OF PROCEDURE:  02/21/2015       PREOPERATIVE DIAGNOSIS:   LOOSE LEFT UNI KNEE       Estimated body mass index is 31.47 kg/(m^2) as calculated from the following:   Height as of 02/10/15: 5\' 7"  (1.702 m).   Weight as of this encounter: 91.173 kg (201 lb).                                                        POSTOPERATIVE DIAGNOSIS:   LOOSE LEFT UNI KNEE                                                                       PROCEDURE:  Procedure(s): TOTAL KNEE ARTHROPLASTY WITH REVISION COMPONENTS Using DepuyAttune RP implants #6L Femur, #7 Tibia, 6 mm Attune RP bearing, 41 Patella     SURGEON: Danie Hannig J    ASSISTANT:   [employer]   (Present and scrubbed throughout the case, critical for assistance with exposure, retraction, instrumentation, and closure.)         ANESTHESIA: Spinal, 20cc Exparel, 20cc 0.5% Marcaine  EBL: 300  FLUID REPLACEMENT: 1600 crystalloid  TOURNIQUET TIME: 22min  Drains: None  Tranexamic Acid: 1gm iv, 2gm topical   COMPLICATIONS:  None         INDICATIONS FOR PROCEDURE: The patient has  LOOSE LEFT UNI KNEE , varus deformities, XR shows loosening of the femoral component with a clear space between the metal and the underlying bone cement interface.. Patient has failed all conservative measures including anti-inflammatory medicines, narcotics, observation.  Plan is to revise the femoral and tibial components utilizing DePuy attune total knee system. Risks and benefits of surgery have been discussed, questions answered.   DESCRIPTION OF PROCEDURE: The patient identified by armband, received  IV antibiotics, in the holding area at [facility]. Patient taken to the operating room, appropriate anesthetic  monitors were attached, and Spinal anesthesia was  induced. Tourniquet  applied high to the operative thigh. Lateral post and  foot positioner  applied to the table, the lower extremity was then prepped and draped  in usual sterile fashion from the toes to the tourniquet. Time-out procedure was performed. We began the operation, with the knee flexed 120 degrees, by making the anterior medial parapatellar incision starting at handbreadth above the patella going medial to the patella following the old unicondylar incision and ending 1 cm medial to and 4 cm distal to the tibial tubercle. Small bleeders in the skin and the  subcutaneous tissue identified and cauterized. Ethibond sutures were identified and removed from the old surgery. Transverse retinaculum was incised and reflected medially and a medial parapatellar arthrotomy was accomplished. the patella was everted and theprepatellar fat pad resected. The superficial medial collateral  ligament was then elevated from anterior to posterior along the proximal  flare of  the tibia and anterior half of the menisci resected. The knee was hyperflexed exposing the unicondylar knee components. The femoral component was in fact grossly loose and removed with a tenaculum. The tibial component was well fixed and using a small anterior cruciate ligament saw and osteotome we went around the periphery and separated the metal-backed tibial component from the underlying bone cement interface. Peripheral and notch osteophytes as well as the cruciate ligaments were then resected. We continued to  work our way around posteriorly along the proximal tibia, and externally  rotated the tibia subluxing it out from underneath the femur. A McHale  retractor was placed through the notch and a lateral Hohmann retractor  placed, and we then drilled through the proximal tibia in line with the  axis of the tibia followed by an intramedullary guide rod and 2-degree  posterior slope cutting guide. The tibial cutting guide, 3 degree posterior sloped, was pinned into place allowing resection of 1 mm of bone  medially and 10 mm of bone laterally. Satisfied with the tibial resection, we then  entered the distal femur 2 mm anterior to the PCL origin with the  intramedullary guide rod and applied the distal femoral cutting guide  set at 9 mm, with 5 degrees of valgus. This was pinned along the  epicondylar axis. At this point, the distal femoral cut was accomplished without difficulty. We did remove a few millimeters of bone and cement medially. We then sized for a #6L femoral component and pinned the guide using a small saw blade off the previously resected medial femoral condyle, the chamfer cutting guide was pinned into place. The anterior, posterior, and chamfer cuts were accomplished without difficulty followed by the Attune RP box cutting guide and the box cut. We also removed posterior osteophytes from the posterior femoral condyles. At this  time, the knee was brought into full extension. We checked our  extension and flexion gaps and found them symmetric for a 6 mm bearing. Distracting in extension with a lamina spreader, the posterior horns of the menisci were removed, and Exparel, diluted to 60 cc, with 20cc NS, and 20cc 0.5% Marcaine,was injected into the capsule and synovium of the knee. The posterior patella cut was accomplished with the 9.5 mm Attune cutting guide, sized for a 63mm dome, and the fixation pegs drilled.The knee  was then once again hyperflexed exposing the proximal tibia. We sized for a # 7 tibial base plate, applied the smokestack and the conical reamer followed by the the Delta fin keel punch. We then hammered into place the Attune RP trial femoral component, drilled the lugs, inserted a  6 mm trial bearing, trial patellar button, and took the knee through range of motion from 0-130 degrees. No thumb pressure was required for patellar Tracking. At this point, the limb was wrapped with an Esmarch bandage and the tourniquet inflated to 350 mmHg. All trial components were removed, mating  surfaces irrigated with pulse lavage, and dried with suction and sponges. A double batch of DePuy HV cement with 1500 mg of Zinacef was mixed and applied to all bony metallic mating surfaces except for the posterior condyles of the femur itself. In order, we  hammered into place the tibial tray and removed excess cement, the femoral component and removed excess cement. The final Attune RP bearing  was inserted, and the knee brought to full extension with compression.  The patellar button was clamped into place, and excess cement  removed. While the cement cured  the wound was irrigated out with normal saline solution pulse lavage. Ligament stability and patellar tracking were checked and found to be excellent. The parapatellar arthrotomy was closed with  running #1 Vicryl suture. The subcutaneous tissue with 0 and 2-0 undyed  Vicryl suture, and the skin with running 3-0 SQ vicryl. A dressing of Xeroform,  4 x 4, dressing sponges, Webril, and Ace wrap applied. The patient  awakened, and taken to recovery room without difficulty.   Juanya Villavicencio J 02/21/2015, 11:54 AM

## 2015-02-21 NOTE — Anesthesia Preprocedure Evaluation (Addendum)
Anesthesia Evaluation  Patient identified by MRN, date of birth, ID band Patient awake    Reviewed: Allergy & Precautions, NPO status , Patient's Chart, lab work & pertinent test results  History of Anesthesia Complications Negative for: history of anesthetic complications  Airway Mallampati: II  TM Distance: >3 FB Neck ROM: Full    Dental  (+) Teeth Intact   Pulmonary neg pulmonary ROS,    breath sounds clear to auscultation       Cardiovascular negative cardio ROS   Rhythm:Regular Rate:Normal     Neuro/Psych negative neurological ROS     GI/Hepatic negative GI ROS, Neg liver ROS,   Endo/Other  negative endocrine ROS  Renal/GU negative Renal ROS     Musculoskeletal  (+) Arthritis ,   Abdominal   Peds  Hematology negative hematology ROS (+)   Anesthesia Other Findings   Reproductive/Obstetrics                             Anesthesia Physical Anesthesia Plan  ASA: I  Anesthesia Plan: Spinal   Post-op Pain Management: GA combined w/ Regional for post-op pain   Induction: Intravenous  Airway Management Planned: Natural Airway and Nasal Cannula  Additional Equipment:   Intra-op Plan:   Post-operative Plan:   Informed Consent: I have reviewed the patients History and Physical, chart, labs and discussed the procedure including the risks, benefits and alternatives for the proposed anesthesia with the patient or authorized representative who has indicated his/her understanding and acceptance.   Dental advisory given  Plan Discussed with: CRNA and Surgeon  Anesthesia Plan Comments:         Anesthesia Quick Evaluation

## 2015-02-21 NOTE — Discharge Instructions (Signed)

## 2015-02-21 NOTE — Anesthesia Procedure Notes (Addendum)
Anesthesia Regional Block:  Adductor canal block  Pre-Anesthetic Checklist: ,, timeout performed, Correct Patient, Correct Site, Correct Laterality, Correct Procedure, Correct Position, site marked, Risks and benefits discussed,  Surgical consent,  Pre-op evaluation,  At surgeon's request and post-op pain management  Laterality: Lower  Prep: chloraprep       Needles:      Needle Length: 9cm 9 cm   Needle insertion depth: 6 cm   Additional Needles:  Procedures: ultrasound guided (picture in chart) Adductor canal block Narrative:  Start time: 02/21/2015 9:00 AM End time: 02/21/2015 9:15 AM Injection made incrementally with aspirations every 5 mL.  Performed by: Personally  Anesthesiologist: MASSAGEE, TERRY  Additional Notes: Tolerated well   Spinal Patient location during procedure: OR Start time: 02/21/2015 10:05 AM End time: 02/21/2015 10:15 AM Staffing Anesthesiologist: MASSAGEE, TERRY Performed by: anesthesiologist  Preanesthetic Checklist Completed: patient identified, site marked, surgical consent, pre-op evaluation, timeout performed, IV checked and monitors and equipment checked Spinal Block Patient position: sitting Patient monitoring: heart rate, cardiac monitor, continuous pulse ox and blood pressure Approach: midline Location: L3-4 Injection technique: single-shot Needle Needle type: Quincke  Needle gauge: 25 G Needle length: 9 cm Needle insertion depth: 5 cm Assessment Sensory level: T6 Additional Notes Slightly + heme  Initially, then cleared ,+ CSF return  Procedure Name: MAC Date/Time: 02/21/2015 10:12 AM Performed by: Jacquiline Doe A Pre-anesthesia Checklist: Patient identified, Timeout performed, Emergency Drugs available, Suction available and Patient being monitored Patient Re-evaluated:Patient Re-evaluated prior to inductionOxygen Delivery Method: Nasal cannula Intubation Type: IV induction Placement Confirmation: positive ETCO2 Dental  Injury: Teeth and Oropharynx as per pre-operative assessment

## 2015-02-21 NOTE — Transfer of Care (Signed)
Immediate Anesthesia Transfer of Care Note  Patient: Brendan Bell  Procedure(s) Performed: Procedure(s): TOTAL KNEE ARTHROPLASTY WITH REVISION COMPONENTS (Left)  Patient Location: PACU  Anesthesia Type:Spinal  Level of Consciousness: awake, patient cooperative and responds to stimulation  Airway & Oxygen Therapy: Patient Spontanous Breathing and Patient connected to nasal cannula oxygen  Post-op Assessment: Report given to RN and Post -op Vital signs reviewed and stable  Post vital signs: Reviewed and stable  Last Vitals:  Filed Vitals:   02/21/15 0930 02/21/15 1222  BP: 132/85   Pulse: 83 80  Temp:  36.4 C  Resp: 11     Complications: No apparent anesthesia complications

## 2015-02-21 NOTE — Interval H&P Note (Signed)
History and Physical Interval Note:  02/21/2015 9:04 AM  Brendan Bell  has presented today for surgery, with the diagnosis of LOOSE LEFT UNI KNEE   The various methods of treatment have been discussed with the patient and family. After consideration of risks, benefits and other options for treatment, the patient has consented to  Procedure(s): TOTAL KNEE ARTHROPLASTY WITH REVISION COMPONENTS (Left) as a surgical intervention .  The patient's history has been reviewed, patient examined, no change in status, stable for surgery.  I have reviewed the patient's chart and labs.  Questions were answered to the patient's satisfaction.     Kerin Salen

## 2015-02-21 NOTE — Progress Notes (Signed)
Orthopedic Tech Progress Note Patient Details:  Brendan Bell Dec 14, 1950 [id number]  CPM Left Knee CPM Left Knee: On Left Knee Flexion (Degrees): 40 Left Knee Extension (Degrees): 10 Additional Comments: Trapeze bar   [name] 02/21/2015, 12:52 PM

## 2015-02-22 ENCOUNTER — Encounter (HOSPITAL_COMMUNITY): Payer: Self-pay | Admitting: Orthopedic Surgery

## 2015-02-22 LAB — CBC
HCT: 38.7 % — ABNORMAL LOW (ref 39.0–52.0)
HEMOGLOBIN: 13.2 g/dL (ref 13.0–17.0)
MCH: 30.6 pg (ref 26.0–34.0)
MCHC: 34.1 g/dL (ref 30.0–36.0)
MCV: 89.8 fL (ref 78.0–100.0)
Platelets: 164 10*3/uL (ref 150–400)
RBC: 4.31 MIL/uL (ref 4.22–5.81)
RDW: 13.4 % (ref 11.5–15.5)
WBC: 10.1 10*3/uL (ref 4.0–10.5)

## 2015-02-22 LAB — BASIC METABOLIC PANEL
Anion gap: 8 (ref 5–15)
BUN: 13 mg/dL (ref 6–20)
CHLORIDE: 102 mmol/L (ref 101–111)
CO2: 22 mmol/L (ref 22–32)
CREATININE: 1.08 mg/dL (ref 0.61–1.24)
Calcium: 8.5 mg/dL — ABNORMAL LOW (ref 8.9–10.3)
GFR calc Af Amer: >60 mL/min (ref >60)
GFR calc non Af Amer: >60 mL/min (ref >60)
GLUCOSE: 125 mg/dL — AB (ref 65–99)
Potassium: 3.8 mmol/L (ref 3.5–5.1)
SODIUM: 132 mmol/L — AB (ref 135–145)

## 2015-02-22 NOTE — Progress Notes (Signed)
Patient ID: Brendan Bell, male   DOB: 05/17/1950, 65 y.o.   MRN: [id number] PATIENT ID: Brendan Bell  MRN: [id number]  DOB/AGE:  1951/01/12 / 65 y.o.  1 Day Post-Op Procedure(s) (LRB): TOTAL KNEE ARTHROPLASTY WITH REVISION COMPONENTS (Left)    PROGRESS NOTE Subjective: Patient is alert, oriented, x1 Nausea, no Vomiting, yes passing gas. Taking PO well. Denies SOB, Chest or Calf Pain. Using Incentive Spirometer, PAS in place. Ambulate wbat, CPM 0-40 Patient reports pain as 3/10 .    Objective: Vital signs in last 24 hours: Filed Vitals:   02/21/15 2108 02/21/15 2218 02/22/15 0207 02/22/15 0551  BP: 147/76  116/65 117/61  Pulse: 94  90 84  Temp: 99.3 F (37.4 C) 98.8 F (37.1 C) 98 F (36.7 C) 100.1 F (37.8 C)  TempSrc: Oral Oral Oral Oral  Resp: 18  18 17   Weight:      SpO2: 97%  99% 98%      Intake/Output from previous day: I/O last 3 completed shifts: In: 3130 [P.O.:830; I.V.:2300] Out: 1075 [Urine:975; Blood:100]   Intake/Output this shift:     LABORATORY DATA: No results for input(s): WBC, HGB, HCT, PLT, NA, K, CL, CO2, BUN, CREATININE, GLUCOSE, GLUCAP, INR, CALCIUM in the last 72 hours.  Invalid input(s): PT, 2  Examination: Neurologically intact ABD soft Neurovascular intact Sensation intact distally Intact pulses distally Dorsiflexion/Plantar flexion intact Incision: dressing C/D/I No cellulitis present Compartment soft}  Assessment:   1 Day Post-Op Procedure(s) (LRB): TOTAL KNEE ARTHROPLASTY WITH REVISION COMPONENTS (Left) ADDITIONAL DIAGNOSIS: Expected Acute Blood Loss Anemia,   Plan: PT/OT WBAT, CPM 5/hrs day until ROM 0-90 degrees, then D/C CPM DVT Prophylaxis:  SCDx72hrs, ASA 325 mg BID x 2 weeks DISCHARGE PLAN: Home DISCHARGE NEEDS: HHPT, CPM, Walker and 3-in-1 comode seat     Nomar Broad J 02/22/2015, 7:20 AM

## 2015-02-22 NOTE — Anesthesia Postprocedure Evaluation (Signed)
Anesthesia Post Note  Patient: Brendan Bell  Procedure(s) Performed: Procedure(s) (LRB): TOTAL KNEE ARTHROPLASTY WITH REVISION COMPONENTS (Left)  Patient location during evaluation: PACU Anesthesia Type: General Level of consciousness: awake and alert Pain management: pain level controlled Vital Signs Assessment: post-procedure vital signs reviewed and stable Respiratory status: spontaneous breathing, nonlabored ventilation, respiratory function stable and patient connected to nasal cannula oxygen Cardiovascular status: blood pressure returned to baseline and stable Postop Assessment: no signs of nausea or vomiting Anesthetic complications: no    Last Vitals:  Filed Vitals:   02/22/15 1139 02/22/15 1300  BP:  117/67  Pulse:  89  Temp: 37.7 C 36.5 C  Resp:  16    Last Pain:  Filed Vitals:   02/22/15 1336  PainSc: 4                  Vivan Vanderveer,JAMES TERRILL

## 2015-02-22 NOTE — Care Management Note (Signed)
Case Management Note  Patient Details  Name: Brendan Bell MRN: [id number] Date of Birth: 02-13-1950  Subjective/Objective:    65 yr old gentleman s/p Left total knee revision    Action/Plan: Case manager spoke with patient at bedside and his wife via phone concerning home health and DME needs. Mrs. Ruter states that they want to use [facility]. Christa See RN, [facility] Liaison has the referral. Patient states he has rolling walker, 3in1 and CPM have been delivered to his home. Patient will have family support at discharge.    Expected Discharge Date:    02/22/15              Expected Discharge Plan:   home with Home health  In-House Referral:  NA  Discharge planning Services  CM Consult  Post Acute Care Choice:  Home Health Choice offered to:  Patient, Spouse  DME Arranged:  N/A DME Agency:  [employer]  HH Arranged:  PT HH Agency:  [facility]  Status of Service:  Completed, signed off  Medicare Important Message Given:    Date Medicare IM Given:    Medicare IM give by:    Date Additional Medicare IM Given:    Additional Medicare Important Message give by:     If discussed at [place] of Stay Meetings, dates discussed:    Additional Comments:  Ninfa Meeker, RN 02/22/2015, 10:31 AM

## 2015-02-22 NOTE — Care Management (Signed)
Utilization review completed. Duana Benedict, RN Case Manager [phone]. 

## 2015-02-22 NOTE — Evaluation (Signed)
Physical Therapy Evaluation Patient Details Name: Brendan Bell MRN: [id number] DOB: 03/08/1950 Today's Date: 02/22/2015   History of Present Illness  65 y.o. 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 The indications for the revision of the total knee arthroplasty are loosening of one or more components. Onset of symptoms was gradual starting 1 years ago with gradually worsening course since that time. Prior procedures on the left knee(s) include unicompartmental arthroplasty. Patient is being admitted for left revision total knee arthroplasty.  Clinical Impression  Pt with the above history. PO Day 1. Pt moving well, with nice quad control. Tolerated HEP instruction and ambulating 135ft with RW this morning. Overall pt Min Guard for all mobility for safety as it was his first time OOB. Will attempt stair training this afternoon. Pt eager to return home and has support from wife and children upon D/C. Recommending HHPT short term to maximize mobility and Independence.    Follow Up Recommendations Home health PT;Supervision - Intermittent    Equipment Recommendations  None recommended by PT    Recommendations for Other Services       Precautions / Restrictions Precautions Precautions: Fall;Knee Precaution Booklet Issued: Yes (comment) Precaution Comments: educated on precautions and HEP Restrictions Weight Bearing Restrictions: Yes LLE Weight Bearing: Weight bearing as tolerated      Mobility  Bed Mobility Overal bed mobility: Modified Independent                Transfers Overall transfer level: Needs assistance Equipment used: Rolling walker (2 wheeled) Transfers: Sit to/from  [facility] Sit to Stand: Min guard Stand pivot transfers: Min guard       General transfer comment: cueing to push from bed vs pull on RW, safety reminders to hold onto walker once in standing  Ambulation/Gait Ambulation/Gait assistance: Min guard Ambulation Distance (Feet): 150 Feet Assistive device: Rolling walker (2 wheeled) Gait Pattern/deviations: Step-through pattern;Decreased stride length;Decreased step length - left;Decreased step length - right;Antalgic Gait velocity: decreased Gait velocity interpretation: Below normal speed for age/gender General Gait Details: cueing to flex at L knee vs hip hike in swing phase  Stairs            Wheelchair Mobility    Modified Rankin (Stroke Patients Only)       Balance Overall balance assessment: Modified Independent                                           Pertinent Vitals/Pain Pain Assessment: 0-10 Pain Score: 1  Pain Location: L knee Pain Descriptors / Indicators: Aching Pain Intervention(s): Monitored during session    Home Living Family/patient expects to be discharged to:: Private residence Living Arrangements: Spouse/significant other;Children Available Help at Discharge: Family;Available 24 hours/day Type of Home: House Home Access: Stairs to enter Entrance Stairs-Rails: Right Entrance Stairs-Number of Steps: 3 Home Layout: One level Home Equipment: Walker - 2 wheels;Cane - quad;Bedside commode      Prior Function Level of Independence: Independent               Hand Dominance   Dominant Hand: Right    Extremity/Trunk Assessment   Upper Extremity Assessment: Overall  WFL for tasks assessed           Lower Extremity Assessment: LLE deficits/detail;Overall WFL for tasks assessed   LLE Deficits / Details: Pt initiated strong quad set and actively reached 50 degrees of flexion  Cervical / Trunk Assessment: Normal  Communication   Communication: No  difficulties  Cognition Arousal/Alertness: Awake/alert Behavior During Therapy: WFL for tasks assessed/performed Overall Cognitive Status: Within Functional Limits for tasks assessed                      General Comments      Exercises Total Joint Exercises Ankle Circles/Pumps: AROM;10 reps;Left;Supine Quad Sets: AROM;10 reps;Supine;Left Heel Slides: AROM;AAROM;Left;10 reps;Supine Goniometric ROM: 5-50 (approximately lacking 5 degrees of extension and reachign 50) Marching in Standing: AROM;Both;5 reps;Standing      Assessment/Plan    PT Assessment Patient needs continued PT services  PT Diagnosis Abnormality of gait;Difficulty walking   PT Problem List Decreased range of motion;Decreased activity tolerance;Decreased balance;Decreased mobility  PT Treatment Interventions DME instruction;Gait training;Stair training;Functional mobility training;Therapeutic activities;Therapeutic exercise;Balance training   PT Goals (Current goals can be found in the Care Plan section) Acute Rehab PT Goals Patient Stated Goal: Do what I need to do PT Goal Formulation: With patient Time For Goal Achievement: 03/08/15 Potential to Achieve Goals: Good    Frequency 7X/week   Barriers to discharge        Co-evaluation               End of Session Equipment Utilized During Treatment: Gait belt Activity Tolerance: Patient tolerated treatment well Patient left: in chair;with call bell/phone within reach Nurse Communication: Mobility status         Time: 0720-0745 PT Time Calculation (min) (ACUTE ONLY): 25 min   Charges:   PT Evaluation $PT Eval Low Complexity: 1 Procedure PT Treatments $Gait Training: 8-22 mins   PT G Codes:        Ara Kussmaul 03/23/2015, 10:46 AM  Ara Kussmaul, Student Physical Therapist Acute Rehab [phone]

## 2015-02-22 NOTE — Discharge Summary (Signed)
Patient ID: Brendan Bell MRN: [id number] DOB/AGE: 63-Oct-1952 63 y.o.  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 Rhinitis, allergic   . Depression   . Hyperlipidemia   . Anxiety   . Pneumonia 10/04/98    out patient  . Osteoarthritis     Surgeries: Procedure(s): TOTAL KNEE ARTHROPLASTY WITH REVISION COMPONENTS on 02/21/2015   Consultants:    Discharged Condition: Improved  Hospital Course: Brendan Bell is an 65 y.o. male who was admitted 02/21/2015 for operative treatment ofLoosening of knee joint prosthesis ([place]). Patient has severe unremitting pain that affects sleep, daily activities, and work/hobbies. After pre-op clearance the patient was taken to the operating room on 02/21/2015 and underwent  Procedure(s): TOTAL KNEE ARTHROPLASTY WITH REVISION COMPONENTS.    Patient was given perioperative antibiotics: Anti-infectives    Start     Dose/Rate Route Frequency Ordered Stop   02/21/15 1038  cefUROXime (ZINACEF) injection  Status:  Discontinued       As needed 02/21/15 1038 02/21/15 1219   02/21/15 0900  ceFAZolin (ANCEF) 3 g in dextrose 5 % 50 mL IVPB     3 g 130 mL/hr over 30 Minutes Intravenous To [facility]Stay [facility] 02/18/15 1546 02/21/15 1020       Patient was given sequential compression devices, early ambulation, and chemoprophylaxis to prevent DVT.  Patient benefited maximally from hospital stay and there were no complications.    Recent vital signs: Patient Vitals for the past 24 hrs:  BP Temp Temp src Pulse Resp SpO2  02/22/15 1300 117/67 mmHg 97.7 F (36.5 C) Oral 89 16 98 %  02/22/15 1139 - 99.8 F (37.7 C) Oral - - -  02/22/15 0551 117/61 mmHg 100.1 F (37.8 C) Oral 84 17 98 %  02/22/15 0207 116/65 mmHg 98 F (36.7 C) Oral 90 18 99 %   02/21/15 2218 - 98.8 F (37.1 C) Oral - - -  02/21/15 2108 (!) 147/76 mmHg 99.3 F (37.4 C) Oral 94 18 97 %     Recent laboratory studies:  Recent Labs  02/22/15 0855  WBC 10.1  HGB 13.2  HCT 38.7*  PLT 164  NA 132*  K 3.8  CL 102  CO2 22  BUN 13  CREATININE 1.08  GLUCOSE 125*  CALCIUM 8.5*     Discharge Medications:     Medication List    TAKE these medications        aspirin EC 325 MG tablet  Take 1 tablet (325 mg total) by mouth 2 (two) times daily.     citalopram 40 MG tablet  Commonly known as:  CELEXA  TAKE 1 TABLET (40 MG TOTAL) BY MOUTH DAILY.     oxyCODONE-acetaminophen 5-325 MG tablet  Commonly known as:  ROXICET  Take 1 tablet by mouth every 4 (four) hours as needed.     tiZANidine 2 MG tablet  Commonly known as:  ZANAFLEX  Take 1 tablet (2 mg total) by mouth every 6 (six) hours as needed for muscle spasms.        Diagnostic Studies: Dg Chest 2 View  02/10/2015  CLINICAL DATA:  Arthroplasty. EXAM: CHEST  2 VIEW COMPARISON:  No prior. FINDINGS: Mediastinum and hilar structures normal. Low lung volumes with mild bibasilar atelectasis. No pleural effusion or pneumothorax.  No acute bony abnormality. Mild cardiomegaly. No pulmonary venous congestion. Degenerative changes thoracic spine. IMPRESSION: 1.  Low lung volumes with mild basilar atelectasis. 2. Mild cardiomegaly.  No pulmonary venous congestion. Electronically Signed   By: Marcello Moores  Register   On: 02/10/2015 12:49    Disposition: Final discharge disposition not confirmed      Discharge Instructions    CPM    Complete by:  As directed   Continuous passive motion machine (CPM):      Use the CPM from 0 to 60  for 5 hours per day.      You may increase by 10 degrees per day.  You may break it up into 2 or 3 sessions per day.      Use CPM for 2 weeks or until you are told to stop.     Call MD / Call 911    Complete by:  As directed   If you experience chest pain or shortness of breath, CALL  911 and be transported to the hospital emergency room.  If you develope a fever above 101 F, pus (white drainage) or increased drainage or redness at the wound, or calf pain, call your surgeon's office.     Change dressing    Complete by:  As directed   Change dressing on 5, then change the dressing daily with sterile 4 x 4 inch gauze dressing and apply TED hose.  You may clean the incision with alcohol prior to redressing.     Constipation Prevention    Complete by:  As directed   Drink plenty of fluids.  Prune juice may be helpful.  You may use a stool softener, such as Colace (over the counter) 100 mg twice a day.  Use MiraLax (over the counter) for constipation as needed.     Diet - low sodium heart healthy    Complete by:  As directed      Driving restrictions    Complete by:  As directed   No driving for 2 weeks     Increase activity slowly as tolerated    Complete by:  As directed      Patient may shower    Complete by:  As directed   You may shower without a dressing once there is no drainage.  Do not wash over the wound.  If drainage remains, cover wound with plastic wrap and then shower.           Follow-up Information    Follow up with Kerin Salen, MD In 2 weeks.   Specialty:  Orthopedic Surgery   Contact information:   [place] [address] [phone]       Follow up with [facility].   Why:  Someone from [facility] will contact you concerning start date and time for therapy.   Contact information:   [address] [place] [address] [phone]        Signed: Hardin Negus, Leshia Kope R 02/22/2015, 4:10 PM

## 2015-02-22 NOTE — Progress Notes (Signed)
Occupational Therapy Evaluation/Discharge Patient Details Name: Brendan Bell MRN: [id number] DOB: 01-14-1951 Today's Date: 02/22/2015    History of Present Illness 65 y.o. male s/p L TKA. PMH sigfificant for HLD, anxiety, depression, osteoarthritis, multiple surgeries to both knees.   Clinical Impression   Patient evaluated by Occupational Therapy with no further acute OT needs identified. Pt completed all functional transfers and ADLs at supervision level. Reviewed compensatory strategies for LB ADLs, fall prevention, and pain/edema management strategies. All education has been completed and pt has no further questions. No OT follow up recommendations at this time - recommend 3in1 to facilitate safe transfers at home. OT signing off. Thank you for this referral.    Follow Up Recommendations  No OT follow up;Supervision - Intermittent    Equipment Recommendations  3 in 1 bedside comode    Recommendations for Other Services       Precautions / Restrictions Precautions Precautions: Fall;Knee Precaution Booklet Issued: No Precaution Comments: Re-educated pt on not placing pillow/object under L knee Restrictions Weight Bearing Restrictions: Yes LLE Weight Bearing: Weight bearing as tolerated      Mobility Bed Mobility Overal bed mobility: Modified Independent             General bed mobility comments: HOB flat, no use of bedrails to simulate home environment.   Transfers Overall transfer level: Needs assistance Equipment used: Rolling walker (2 wheeled) Transfers: Sit to/from Stand Sit to Stand: Supervision         General transfer comment: Supervision for safety. Good demonstration of safe hand placement on seated surfaces and RW.    Balance Overall balance assessment: Needs assistance Sitting-balance support: No upper extremity supported;Feet supported Sitting balance-Leahy Scale: Good     Standing balance support: Bilateral upper extremity  supported;During functional activity Standing balance-Leahy Scale: Fair                              ADL Overall ADL's : Needs assistance/impaired     Grooming: Wash/dry hands;Standing           Upper Body Dressing : Set up;Sitting   Lower Body Dressing: Set up;Sit to/from stand Lower Body Dressing Details (indicate cue type and reason): Able to reach both feet to don/doff socks, underwear and pants Toilet Transfer: Supervision/safety;Ambulation;BSC;RW Toilet Transfer Details (indicate cue type and reason): BSC over toilet Toileting- Clothing Manipulation and Hygiene: Supervision/safety;Sit to/from stand   Tub/ Shower Transfer: Walk-in shower;Supervision/safety;Cueing for sequencing;Ambulation;Rolling walker Tub/Shower Transfer Details (indicate cue type and reason): Cues for proper step sequence Functional mobility during ADLs: Supervision/safety;Rolling walker General ADL Comments: Supervision for all ADLs and mobility. Reviewed compensatory strategies, fall prevention, and pain/edema management strategies.     Vision Vision Assessment?: No apparent visual deficits   Perception     Praxis      Pertinent Vitals/Pain Pain Assessment: 0-10 Pain Score: 3  Pain Location: L knee Pain Descriptors / Indicators: Aching Pain Intervention(s): Limited activity within patient's tolerance;Monitored during session;Repositioned;Ice applied     Hand Dominance Right   Extremity/Trunk Assessment Upper Extremity Assessment Upper Extremity Assessment: Overall WFL for tasks assessed   Lower Extremity Assessment Lower Extremity Assessment: LLE deficits/detail LLE Deficits / Details: Decreased ROM and strength as expected post op   Cervical / Trunk Assessment Cervical / Trunk Assessment: Normal   Communication Communication Communication: No difficulties   Cognition Arousal/Alertness: Awake/alert Behavior During Therapy: WFL for tasks assessed/performed Overall  Cognitive Status: Within Functional Limits  for tasks assessed                     General Comments       Exercises       Shoulder Instructions      Home Living Family/patient expects to be discharged to:: Private residence Living Arrangements: Spouse/significant other;Children Available Help at Discharge: Family;Available 24 hours/day Type of Home: House Home Access: Stairs to enter [employer] of Steps: 3 Entrance Stairs-Rails: Right Home Layout: One level     Bathroom Shower/Tub: Walk-in shower;Door   [employer] Toilet: Standard Bathroom Accessibility: Yes How Accessible: Accessible via walker Home Equipment: Walker - 2 wheels;Cane - single point;Bedside commode;Shower seat;Hand held shower head          Prior Functioning/Environment Level of Independence: Independent             OT Diagnosis: Acute pain   OT Problem List: Decreased strength;Decreased range of motion;Decreased activity tolerance;Impaired balance (sitting and/or standing);Decreased coordination;Decreased safety awareness;Decreased knowledge of use of DME or AE;Decreased knowledge of precautions;Pain   OT Treatment/Interventions:      OT Goals(Current goals can be found in the care plan section) Acute Rehab OT Goals Patient Stated Goal: to go home today OT Goal Formulation: With patient Time For Goal Achievement: 03/08/15 Potential to Achieve Goals: Good  OT Frequency:     Barriers to D/C:            Co-evaluation              End of Session Equipment Utilized During Treatment: Gait belt;Rolling walker CPM Left Knee CPM Left Knee: Off Nurse Communication: Mobility status;Precautions  Activity Tolerance: Patient tolerated treatment well Patient left: in chair;with call bell/phone within reach;with family/visitor present   Time: [id number] OT Time Calculation (min): 11 min Charges:  OT General Charges $OT Visit: 1 Procedure OT Evaluation $OT Eval Moderate  Complexity: 1 Procedure G-Codes:    Redmond Baseman, OTR/L [phone] 02/22/2015, 2:43 PM

## 2015-02-22 NOTE — Progress Notes (Signed)
Pt ready for discharge. IV removed and belongings gathered. Discharge instructions/education reviewed with pt/spose and all questions/concerns addressed. Pt will be transported out via wheelchair. Will continue to monitor

## 2015-02-22 NOTE — Progress Notes (Signed)
Physical Therapy Treatment Patient Details Name: Brendan Bell MRN: [id number] DOB: 03/02/1950 Today's Date: 02/22/2015    History of Present Illness 65 y.o. male s/p L TKA. PMH sigfificant for HLD, anxiety, depression, osteoarthritis, multiple surgeries to both knees.    PT Comments    Pt progressing well towards all acute care goals. Stair trial completed this afternoon without concerns for safety. Ambulating with greater ease with use of RW. Wife present during session and will be able to assist with any needs once home. Educated wife on proper positioning of CPM once pt returned to bed this afternoon. Continuing to recommend HHPT once D/C'd, no acute PT needs holding pt back from D/C.   Follow Up Recommendations  Home health PT;Supervision - Intermittent     Equipment Recommendations  None recommended by PT    Recommendations for Other Services       Precautions / Restrictions Precautions Precautions: Fall;Knee Precaution Booklet Issued: No Precaution Comments: reeducated on precautions and HEP Restrictions Weight Bearing Restrictions: Yes LLE Weight Bearing: Weight bearing as tolerated    Mobility  Bed Mobility Overal bed mobility: Modified Independent             General bed mobility comments: HOB flat, assistance needed to get out of CPM otherwise pt able to rise to EOB  Transfers Overall transfer level: Modified independent Equipment used: Rolling walker (2 wheeled) Transfers: Sit to/from Stand Sit to Stand: Modified independent (Device/Increase time)         General transfer comment: Good demonstration of hand placements and able to rise on first attempt  Ambulation/Gait Ambulation/Gait assistance: Supervision Ambulation Distance (Feet): 150 Feet Assistive device: Rolling walker (2 wheeled) Gait Pattern/deviations: Step-through pattern;Decreased stride length;Decreased stance time - left;Antalgic Gait velocity: decreased Gait velocity  interpretation: Below normal speed for age/gender General Gait Details: resuming normal mechanics of walking with RW   Stairs Stairs: Yes Stairs assistance: Supervision Stair Management: One rail Right;Step to pattern Number of Stairs: 6 General stair comments: displays correct sequence without cueing  Wheelchair Mobility    Modified Rankin (Stroke Patients Only)       Balance Overall balance assessment: Modified Independent Sitting-balance support: No upper extremity supported;Feet supported Sitting balance-Leahy Scale: Good     Standing balance support: Bilateral upper extremity supported;During functional activity Standing balance-Leahy Scale: Fair                      Cognition Arousal/Alertness: Awake/alert Behavior During Therapy: WFL for tasks assessed/performed Overall Cognitive Status: Within Functional Limits for tasks assessed                      Exercises Total Joint Exercises Ankle Circles/Pumps: AROM;10 reps;Left;Supine Quad Sets: AROM;10 reps;Supine;Left Heel Slides: AROM;AAROM;Left;10 reps;Supine    General Comments        Pertinent Vitals/Pain Pain Assessment: 0-10 Pain Score: 2  Pain Location: L knee Pain Descriptors / Indicators: Aching Pain Intervention(s): Monitored during session    Home Living Family/patient expects to be discharged to:: Private residence Living Arrangements: Spouse/significant other;Children Available Help at Discharge: Family;Available 24 hours/day Type of Home: House Home Access: Stairs to enter Entrance Stairs-Rails: Right Home Layout: One level Home Equipment: Walker - 2 wheels;Cane - single point;Bedside commode;Shower seat;Hand held shower head      Prior Function Level of Independence: Independent          PT Goals (current goals can now be found in the care plan section) Acute Rehab  PT Goals Patient Stated Goal: get outta here Progress towards PT goals: Progressing toward goals     Frequency       PT Plan Current plan remains appropriate    Co-evaluation             End of Session Equipment Utilized During Treatment: Gait belt Activity Tolerance: Patient tolerated treatment well Patient left: in bed;in CPM;with call bell/phone within reach;with family/visitor present     Time: [id number] PT Time Calculation (min) (ACUTE ONLY): 11 min  Charges:                       G Codes:      Ara Kussmaul 13-Mar-2015, 3:44 PM  Ara Kussmaul, Student Physical Therapist Acute Rehab [phone]

## 2015-04-25 ENCOUNTER — Ambulatory Visit (INDEPENDENT_AMBULATORY_CARE_PROVIDER_SITE_OTHER): Payer: PRIVATE HEALTH INSURANCE | Admitting: Family Medicine

## 2015-04-25 ENCOUNTER — Encounter: Payer: Self-pay | Admitting: Family Medicine

## 2015-04-25 ENCOUNTER — Other Ambulatory Visit (INDEPENDENT_AMBULATORY_CARE_PROVIDER_SITE_OTHER): Payer: PRIVATE HEALTH INSURANCE

## 2015-04-25 DIAGNOSIS — M216X2 Other acquired deformities of left foot: Secondary | ICD-10-CM | POA: Insufficient documentation

## 2015-04-25 DIAGNOSIS — M19072 Primary osteoarthritis, left ankle and foot: Secondary | ICD-10-CM | POA: Diagnosis not present

## 2015-04-25 DIAGNOSIS — M79672 Pain in left foot: Secondary | ICD-10-CM | POA: Diagnosis not present

## 2015-04-25 DIAGNOSIS — M1990 Unspecified osteoarthritis, unspecified site: Secondary | ICD-10-CM | POA: Diagnosis not present

## 2015-04-25 DIAGNOSIS — M1611 Unilateral primary osteoarthritis, right hip: Secondary | ICD-10-CM | POA: Insufficient documentation

## 2015-04-25 MED ORDER — VITAMIN D (ERGOCALCIFEROL) 1.25 MG (50000 UNIT) PO CAPS: 50000.0000 [IU] | ORAL_CAPSULE | ORAL | Status: DC

## 2015-04-25 NOTE — Progress Notes (Signed)
[facility] [address] [place], [place] [address] Phone: [phone] Subjective:     CC: Left foot pain  [id number] Brendan Bell is a 65 y.o. male coming in with complaint of left foot pain. Patient recently did have a revision of his left knee. Patient states that this is help the knee and has helped somewhat of the hip. Patient continues to have more of a left foot pain. States that hurts more with walking. Better with rest. Seems to be on the dorsal aspect of the foot. No swelling or redness. States that when he stops weightbearing the pain goes away. Has tried over-the-counter orthotics and new shoes with minimal success. Tries to stay away from anti-inflammatories. Rates the severity of pain a 7 out of 10 and worsening. Denies any type of injury.    Past Medical History  Diagnosis Date  . Rhinitis, allergic   . Depression   . Hyperlipidemia   . Anxiety   . Pneumonia 10/04/98    out patient  . Osteoarthritis    Past Surgical History  Procedure Laterality Date  . Knee surgery  1992    right, loose body removed  . Knee arthroscopy w/ partial medial meniscectomy Right 2008    with pateller chondroplasty ([facility])  . Joint replacement  2/13///9/13    medial right knee///medial left knee---Dr Jefm Bryant  . Knee surgery      multiple surgeries to both knees  . Medial partial knee replacement Left   . Medial partial knee replacement Right   . Tonsillectomy    . Eye surgery Bilateral   . Colonoscopy    . Total knee arthroplasty with revision components Left 02/21/2015    Procedure: TOTAL KNEE ARTHROPLASTY WITH REVISION COMPONENTS;  Surgeon: Frederik Pear, MD;  Location: [facility];  Service: Orthopedics;  Laterality: Left;   Social History   Social History  . Marital Status: Married    Spouse Name: N/A  . Number of Children: 5  . Years of Education: N/A   Occupational History  . 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 Smoking status: Never Smoker   . Smokeless tobacco: Never Used  . Alcohol Use: Yes     Comment: 2 beers a day in general.  . Drug Use: No  . Sexual Activity: Not on file   Other Topics Concern  . Not on file   Social History Narrative   2nd marriage.  3 children from first marriage, 2 stepchildren, 2 with current marriage.   No Known Allergies Family History  Problem Relation Age of Onset  . Heart disease Mother     CAD, MI  . Hypertension Mother   . Stroke Father   . Aneurysm Father     died from rupture of AAA  . Cancer Paternal Grandfather     prostate    Past medical history, social, surgical and family history all reviewed in electronic medical record.  No pertanent information unless stated regarding to the chief complaint.   Review of Systems: No headache, visual changes, nausea, vomiting, diarrhea, constipation, dizziness, abdominal pain, skin rash, fevers, chills, night sweats, weight loss, swollen lymph nodes, body aches, joint swelling, muscle aches, chest pain, shortness of breath, mood changes.   Objective There were no vitals taken for this visit.  General: No apparent distress alert and oriented x3 mood and affect normal, dressed appropriately.  HEENT: Pupils equal, extraocular movements  intact  Respiratory: Patient's speak in full sentences and does not appear short of breath  Cardiovascular: No lower extremity edema, non tender, no erythema  Skin: Warm dry intact with no signs of infection or rash on extremities or on axial skeleton.  Abdomen: Soft nontender  Neuro: Cranial nerves II through XII are intact, neurovascularly intact in all extremities with 2+ DTRs and 2+ pulses.  Lymph: No lymphadenopathy of posterior or anterior cervical chain or axillae bilaterally.  Gait Antalgic gait MSK:  Non tender with full range of motion and good stability and symmetric strength and tone of shoulders, elbows, wrist, hip, and ankles bilaterally. He shows replacement  on left knee that seems to be healed well. Does have limitation with internal rotation of the right hip  Leg length discrepancy noted that is approximately quarter of an inch shorter on the left leg.  Left foot shows significant breakdown of the mid and transverse arch. Patient is tender to palpation over the midfoot. Splaying between the first and second toes noted. Mild overpronation of the hindfoot. Mild hallux rigidus noted  Limited musculoskeletal was performed and interpreted by Hulan Saas, M  Limited ultrasound shows the patient does have significant midfoot arthritis changes noted. Especially between second through fourth toes. Impression: Midfoot arthritis    Impression and Recommendations:     This case required medical decision making of moderate complexity.      Note: This dictation was prepared with Dragon dictation along with smaller phrase technology. Any transcriptional errors that result from this process are unintentional.

## 2015-04-25 NOTE — Progress Notes (Signed)
Pre visit review using our clinic review tool, if applicable. No additional management support is needed unless otherwise documented below in the visit note. 

## 2015-04-25 NOTE — Patient Instructions (Signed)
Good to see you  Ice is your friend [employer]en[employer] orthotics "total support" online would be great  Xerelo shoes could be great!   Avoid being barefoot A heel lift in the left shoe will help take some pressure of the right hip as well.  Once weekly vitamin D for next 12 weeks pennsaid pinkie amount topically 2 times daily as needed.  See me again in 4 weeks.

## 2015-04-25 NOTE — Assessment & Plan Note (Signed)
Noted on exam today. Patient wants to continue to manage it conservatively. Patient does have pain medications if needed. Worsening symptoms we may consider injection and possible repeat imaging is necessary.

## 2015-04-25 NOTE — Assessment & Plan Note (Signed)
Severe at this time. We discussed icing and home exercises. We discussed which activities daily which ones to potentially avoid. We discussed proper shoewear as an over-the-counter orthotics. Patient given topical anti-inflammatory. We discussed icing regimen. Patient verbalized vitamin D was given to see if this will be beneficial. We discussed heel lifts with patient having a leg length discrepancy. Patient will come back in 4 weeks. If continuing have pain we'll consider injections.

## 2015-05-16 ENCOUNTER — Ambulatory Visit (INDEPENDENT_AMBULATORY_CARE_PROVIDER_SITE_OTHER): Payer: PRIVATE HEALTH INSURANCE | Admitting: Family Medicine

## 2015-05-16 ENCOUNTER — Encounter: Payer: Self-pay | Admitting: Family Medicine

## 2015-05-16 VITALS — BP 122/88 | HR 82 | Temp 98.3°F | Ht 67.0 in | Wt 198.4 lb

## 2015-05-16 DIAGNOSIS — D2372 Other benign neoplasm of skin of left lower limb, including hip: Secondary | ICD-10-CM | POA: Diagnosis not present

## 2015-05-16 DIAGNOSIS — D367 Benign neoplasm of other specified sites: Secondary | ICD-10-CM

## 2015-05-16 NOTE — Progress Notes (Signed)
Pre visit review using our clinic review tool, if applicable. No additional management support is needed unless otherwise documented below in the visit note. 

## 2015-05-16 NOTE — Progress Notes (Signed)
Dr. [name], MD, [name], [address] Phone: [phone] Fax: [phone]  05/16/2015  Patient: Brendan Bell, MRN: [id number], DOB: [birth year], 65 y.o.  Primary Physician:  [name], MD   Chief Complaint  Patient presents with  . Mass    left thigh    Subjective:   Brendan Bell is a 65 y.o. very pleasant male patient who presents with the following:  L inner thigh, small, no pain, redness warmth. Just noticed it - wanted MD to look at it.  Past Medical History, Surgical History, Social History, Family History, Problem List, Medications, and Allergies have been reviewed and updated if relevant.  Patient Active Problem List   Diagnosis Date Noted  . Arthritis of foot, left, degenerative 04/25/2015  . Arthritis of right hip 04/25/2015  . Loss of transverse plantar arch of left foot 04/25/2015  . Arthritis of knee 02/21/2015  . 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 Elevated blood pressure reading without diagnosis of hypertension 12/21/2014  . Right hip pain 10/28/2014  . Osteoarthritis of both knees 10/16/2011  . Routine general medical examination at a health care facility 06/19/2010  . Hyperlipemia 02/20/2007  . Recurrent major depression in remission ([facility]) 02/20/2007  . ALLERGIC RHINITIS 02/20/2007    Past Medical History  Diagnosis Date  . Rhinitis, allergic   . Depression   . Hyperlipidemia   . Anxiety   . Pneumonia 10/04/98    out patient  . Osteoarthritis     Past Surgical History  Procedure Laterality Date  . Knee surgery  1992    right, loose body removed  . Knee arthroscopy w/ partial medial meniscectomy Right 2008    with pateller chondroplasty ([facility])  . Joint replacement  2/13///9/13    medial right knee///medial left knee---Dr Jefm Bryant  . Knee surgery      multiple surgeries to both knees  . Medial partial  knee replacement Left   . Medial partial knee replacement Right   . Tonsillectomy    . Eye surgery Bilateral   . Colonoscopy    . Total knee arthroplasty with revision components Left 02/21/2015    Procedure: TOTAL KNEE ARTHROPLASTY WITH REVISION COMPONENTS;  Surgeon: Frederik Pear, MD;  Location: [facility];  Service: Orthopedics;  Laterality: Left;    Social History   Social History  . Marital Status: Married    Spouse Name: N/A  . Number of Children: 5  . Years of Education: N/A   Occupational History  . 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 Smoking status: Never Smoker   . Smokeless tobacco: Never Used  . Alcohol Use: Yes     Comment: 2 beers a day in general.  . Drug Use: No  . Sexual Activity: Not on file   Other Topics Concern  . Not on file   Social History Narrative   2nd marriage.  3 children from first marriage, 2 stepchildren, 2 with current marriage.    Family History  Problem Relation Age of Onset  . Heart disease Mother     CAD, MI  . Hypertension Mother   . Stroke Father   . Aneurysm Father     died from rupture of AAA  . Cancer Paternal Grandfather     prostate    No Known Allergies  Medication list  reviewed and updated in full in [facility].   GEN: No acute illnesses, no fevers, chills. GI: No n/v/d, eating normally Pulm: No SOB Interactive and getting along well at home.  Otherwise, ROS is as per the HPI.  Objective:   BP 122/88 mmHg  Pulse 82  Temp(Src) 98.3 F (36.8 C) (Oral)  Ht 5\' 7"  (1.702 m)  Wt 198 lb 6.4 oz (89.994 kg)  BMI 31.07 kg/m2  SpO2 97%  GEN: WDWN, NAD, Non-toxic, A & O x 3 HEENT: Atraumatic, Normocephalic. Neck supple. No masses, No LAD. Ears and Nose: No external deformity. CV: RRR, No M/G/R. No JVD. No thrill. No extra heart sounds. PULM: CTA B, no wheezes, crackles, rhonchi. No retractions. No resp. distress. No accessory muscle use. EXTR: No c/c/e NEURO Normal gait.  PSYCH:  Normally interactive. Conversant. Not depressed or anxious appearing.  Calm demeanor.   L thigh inner - 2 cm lipoma vs cyst  Laboratory and Imaging Data:  Assessment and Plan:   Cyst, dermoid, leg, left  Reassurance, dermoid cyst vs possible small lipoma  Follow-up: prn  Signed,  Alistair Senft T. Javed Cotto, MD   Patient's Medications  New Prescriptions   No medications on file  Previous Medications   CITALOPRAM (CELEXA) 40 MG TABLET    TAKE 1 TABLET (40 MG TOTAL) BY MOUTH DAILY.  Modified Medications   No medications on file  Discontinued Medications   ASPIRIN EC 325 MG TABLET    Take 1 tablet (325 mg total) by mouth 2 (two) times daily.   OXYCODONE-ACETAMINOPHEN (ROXICET) 5-325 MG TABLET    Take 1 tablet by mouth every 4 (four) hours as needed.   TIZANIDINE (ZANAFLEX) 2 MG TABLET    Take 1 tablet (2 mg total) by mouth every 6 (six) hours as needed for muscle spasms.   VITAMIN D, ERGOCALCIFEROL, (DRISDOL) 50000 UNITS CAPS CAPSULE    Take 1 capsule (50,000 Units total) by mouth every 7 (seven) days.

## 2015-12-27 ENCOUNTER — Encounter: Payer: PRIVATE HEALTH INSURANCE | Admitting: Internal Medicine

## 2016-01-14 ENCOUNTER — Other Ambulatory Visit: Payer: Self-pay | Admitting: Internal Medicine

## 2016-03-15 ENCOUNTER — Encounter: Payer: Self-pay | Admitting: Internal Medicine

## 2016-03-15 ENCOUNTER — Encounter: Payer: PRIVATE HEALTH INSURANCE | Admitting: Internal Medicine

## 2016-03-15 ENCOUNTER — Ambulatory Visit (INDEPENDENT_AMBULATORY_CARE_PROVIDER_SITE_OTHER): Payer: PRIVATE HEALTH INSURANCE | Admitting: Internal Medicine

## 2016-03-15 VITALS — BP 118/88 | HR 75 | Temp 97.8°F | Ht 66.5 in | Wt 194.0 lb

## 2016-03-15 DIAGNOSIS — F3340 Major depressive disorder, recurrent, in remission, unspecified: Secondary | ICD-10-CM | POA: Diagnosis not present

## 2016-03-15 DIAGNOSIS — Z0000 Encounter for general adult medical examination without abnormal findings: Secondary | ICD-10-CM | POA: Diagnosis not present

## 2016-03-15 NOTE — Progress Notes (Signed)
Pre visit review using our clinic review tool, if applicable. No additional management support is needed unless otherwise documented below in the visit note. 

## 2016-03-15 NOTE — Assessment & Plan Note (Signed)
Needs to stay on med indefinitely

## 2016-03-15 NOTE — Assessment & Plan Note (Signed)
Fairly healthy Staying active and has lost some weight Prefers no flu vaccine--- discussed Will consider shingrix next year Doesn't want PSA Colon due 2020 Defer blood work--- numbness in hand seems to be mild CTS, not neuropathy (he will let me know if progresses)

## 2016-03-15 NOTE — Progress Notes (Signed)
Subjective:    Patient ID: Brendan Bell, male    DOB: Jan 13, 1951, 66 y.o.   MRN: [id number]  HPI Here for physical Had left TKR a while back--- mostly better Still stiff and pain all over in AM in other places--tylenol helps Has been able to exercise more  Some tingling in right hand Can be anytime No weak or really painful  Some ED Doesn't really want meds at this point   Current Outpatient Prescriptions on File Prior to Visit  Medication Sig Dispense Refill  . citalopram (CELEXA) 40 MG tablet TAKE 1 TABLET (40 MG TOTAL) BY MOUTH DAILY. 90 tablet 0   No current facility-administered medications on file prior to visit.     No Known Allergies  Past Medical History:  Diagnosis Date  . Anxiety   . Depression   . Hyperlipidemia   . Osteoarthritis   . Pneumonia 10/04/98   out patient  . Rhinitis, allergic     Past Surgical History:  Procedure Laterality Date  . COLONOSCOPY    . EYE SURGERY Bilateral   . JOINT REPLACEMENT  2/13///9/13   medial right knee///medial left knee---Dr Jefm Bryant  . KNEE ARTHROSCOPY W/ PARTIAL MEDIAL MENISCECTOMY Right 2008   with pateller chondroplasty ([facility])  . KNEE SURGERY  1992   right, loose body removed  . KNEE SURGERY     multiple surgeries to both knees  . MEDIAL PARTIAL KNEE REPLACEMENT Left   . MEDIAL PARTIAL KNEE REPLACEMENT Right   . TONSILLECTOMY    . TOTAL KNEE ARTHROPLASTY WITH REVISION COMPONENTS Left 02/21/2015   Procedure: TOTAL KNEE ARTHROPLASTY WITH REVISION COMPONENTS;  Surgeon: Frederik Pear, MD;  Location: [facility];  Service: Orthopedics;  Laterality: Left;    Family History  Problem Relation Age of Onset  . Heart disease Mother     CAD, MI  . Hypertension Mother   . Stroke Father   . Aneurysm Father     died from rupture of AAA  . Cancer Paternal Grandfather     prostate    Social History   Social History  . Marital status: Married    Spouse name: N/A  . Number of children: 5  . Years of education: N/A    Occupational History  . 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 Smoking status: Never Smoker  . Smokeless tobacco: Never Used  . Alcohol use Yes     Comment: 2 beers a day in general.  . Drug use: No  . Sexual activity: Not on file   Other Topics Concern  . Not on file   Social History Narrative   2nd marriage.  3 children from first marriage, 2 stepchildren, 2 with current marriage.   Review of Systems  Constitutional:       Has lost some weight Wears seat belt  HENT: Negative for dental problem, hearing loss and tinnitus.        Keeps up with dentist  Eyes:       No diplopia or unilateral vision loss Still occasional floaters  Respiratory: Negative for cough, chest tightness and shortness of breath.   Cardiovascular: Negative for chest pain, palpitations and leg swelling.  Gastrointestinal: Negative for abdominal pain, blood in stool, constipation and nausea.       No heartburn  Endocrine: Negative for polydipsia and polyuria.  Genitourinary: Negative for difficulty urinating and urgency.  Musculoskeletal: Positive for arthralgias. Negative  for joint swelling.  Skin: Negative for rash.       Does see the dermatologist yearly  Allergic/Immunologic: Negative for environmental allergies and immunocompromised state.  Neurological: Negative for dizziness, syncope, light-headedness and headaches.       Had a visual disturbance in [place] when dehydrated. 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 Does not bruise/bleed easily.  Psychiatric/Behavioral: The patient is not nervous/anxious.        Mild sleep issues-- no daytime somnolence No flares of the depression       Objective:   Physical Exam  Constitutional: He is oriented to person, place, and time. He appears well-developed and well-nourished. No distress.  HENT:  Head: Normocephalic and atraumatic.   Right Ear: External ear normal.  Left Ear: External ear normal.  Mouth/Throat: Oropharynx is clear and moist. No oropharyngeal exudate.  Eyes: Conjunctivae are normal. Pupils are equal, round, and reactive to light.  Neck: Normal range of motion. Neck supple. No thyromegaly present.  Cardiovascular: Normal rate, regular rhythm, normal heart sounds and intact distal pulses.  Exam reveals no gallop.   No murmur heard. Pulmonary/Chest: Effort normal and breath sounds normal. No respiratory distress. He has no wheezes. He has no rales.  Abdominal: Soft. There is no tenderness.  Musculoskeletal: He exhibits no edema or tenderness.  Lymphadenopathy:    He has no cervical adenopathy.  Neurological: He is alert and oriented to person, place, and time.  Normal grip strength, etc  Skin: No rash noted. No erythema.  Psychiatric: He has a normal mood and affect. His behavior is normal.          Assessment & Plan:

## 2016-03-29 NOTE — Progress Notes (Signed)
[name] [address], [place] [address] Phone: [phone] Subjective:    I'm seeing this patient by the request  of:    CC: Right hip pain Right wrist pain  [id number]  Brendan Bell is a 66 y.o. male coming in with complaint of right hip pain. Patient has known mild to moderate arthritic changes of his hip previously. Patient states Continues tendon and this time. Patient did have a knee replacement none to long ago after a failed unicompartmental knee replacement. Patient unfortunately states that now he is attempting everything else and continues to have groin pain. Seems to radiate her a posteriorly. Patient states that he is ambulating better because of the knee but unfortunately can't increasing activity secondary to the discomfort.  Patient is also complaining of right wrist pain. Describes it as a dull, throbbing aching sensation. Patient states that there is numbness andit it as well. States that it seems to be in the thumb, index and middle finger. An states that it can wake him up at night. Denies any weakness or any difficulty with grip strength. States that it is intermittent but seems to be increasing.    Past Medical History:  Diagnosis Date  . Anxiety   . Depression   . Hyperlipidemia   . Osteoarthritis   . Pneumonia 10/04/98   out patient  . Rhinitis, allergic    Past Surgical History:  Procedure Laterality Date  . COLONOSCOPY    . EYE SURGERY Bilateral   . JOINT REPLACEMENT  2/13///9/13   medial right knee///medial left knee---Dr Jefm Bryant  . KNEE ARTHROSCOPY W/ PARTIAL MEDIAL MENISCECTOMY Right 2008   with pateller chondroplasty ([facility])  . KNEE SURGERY  1992   right, loose body removed  . KNEE SURGERY     multiple surgeries to both knees  . MEDIAL PARTIAL KNEE REPLACEMENT Left   . MEDIAL PARTIAL KNEE REPLACEMENT Right   . TONSILLECTOMY    . TOTAL KNEE ARTHROPLASTY WITH REVISION COMPONENTS Left 02/21/2015   Procedure: TOTAL KNEE ARTHROPLASTY WITH REVISION COMPONENTS;  Surgeon: Frederik Pear, MD;  Location: [facility];  Service: Orthopedics;  Laterality: Left;   Social History   Social History  . Marital status: Married    Spouse name: N/A  . Number of children: 5  . Years of education: N/A   Occupational History  . 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 Smoking status: Never Smoker  . Smokeless tobacco: Never Used  . Alcohol use Yes     Comment: 2 beers a day in general.  . Drug use: No  . Sexual activity: Not on file   Other Topics Concern  . Not on file   Social History Narrative   2nd marriage.  3 children from first marriage, 2 stepchildren, 2 with current marriage.   No Known Allergies Family History  Problem Relation Age of Onset  . Heart disease Mother     CAD, MI  . Hypertension Mother   . Stroke Father   . Aneurysm Father     died from rupture of AAA  . Cancer Paternal Grandfather     prostate    Past medical history, social, surgical and family history all reviewed in electronic medical record.  No pertanent information unless stated regarding to the chief complaint.   Review of Systems:Review of systems updated and as accurate as of 03/29/16  No headache,  visual changes, nausea, vomiting, diarrhea, constipation, dizziness, abdominal pain, skin rash, fevers, chills, night sweats, weight loss, swollen lymph nodes, body aches, joint swelling, muscle aches, chest pain, shortness of breath, mood changes.   Objective  There were no vitals taken for this visit. Systems examined below as of 03/29/16   General: No apparent distress alert and oriented x3 mood and affect normal, dressed appropriately.  HEENT: Pupils equal, extraocular movements intact  Respiratory: Patient's speak in full sentences and does not appear short of breath  Cardiovascular: No lower extremity edema, non tender, no erythema  Skin: Warm dry intact  with no signs of infection or rash on extremities or on axial skeleton.  Abdomen: Soft nontender  Neuro: Cranial nerves II through XII are intact, neurovascularly intact in all extremities with 2+ DTRs and 2+ pulses.  Lymph: No lymphadenopathy of posterior or anterior cervical chain or axillae bilaterally.  Gait Mild antalgic gait MSK:  Non tender with full range of motion and good stability and symmetric strength and tone of shoulders, elbows, knee and ankles bilaterally.  Wrist: Right Inspection normal with no visible erythema or swelling. ROM smooth and normal with good flexion and extension and ulnar/radial deviation that is symmetrical with opposite wrist. Palpation is normal over metacarpals, navicular, lunate, and TFCC; tendons without tenderness/ swelling No snuffbox tenderness. No tenderness over Canal of Guyon. Strength 5/5 in all directions without pain. Negative Finkelstein, positive tinel's and phalens. Negative Watson's test. Contralateral wrist unremarkable  Hip: Right ROM IR: 10 Deg with worsening pain, ER: 25 Deg, Flexion: 120 Deg, Extension: 100 Deg, Abduction: 45 Deg, Adduction: 45 Deg Strength IR: 5/5, ER: 5/5, Flexion: 5/5, Extension: 5/5, Abduction: 5/5, Adduction: 5/5 Pelvic alignment unremarkable to inspection and palpation. Standing hip rotation and gait without trendelenburg sign / unsteadiness. Greater trochanter without tenderness to palpation. No tenderness over piriformis and greater trochanter. Pain with internal rotation but no pain with Corky Sox No SI joint tenderness and normal minimal SI movement.  Procedure: Real-time Ultrasound Guided Injection of right hip Device: GE Logiq Q7  Ultrasound guided injection is preferred based studies that show increased duration, increased effect, greater accuracy, decreased procedural pain, increased response rate with ultrasound guided versus blind injection.  Verbal informed consent obtained.  Time-out conducted.    Noted no overlying erythema, induration, or other signs of local infection.  Skin prepped in a sterile fashion.  Local anesthesia: Topical Ethyl chloride.  With sterile technique and under real time ultrasound guidance:  Anterior capsule visualized, needle visualized going to the head neck junction at the anterior capsule. Pictures taken. Patient did have injection of  3 cc of 0.5% Marcaine, and 1 cc of Kenalog 40 mg/dL. Completed without difficulty  Pain immediately resolved suggesting accurate placement of the medication.  Advised to call if fevers/chills, erythema, induration, drainage, or persistent bleeding.  Images permanently stored and available for review in the ultrasound unit.  Impression: Technically successful ultrasound guided injection.  After verbal consent patient was prepped with alcohol swab and with a 25-gauge half-inch no was injected in the median coronal sheath of the right wrist. Total of 0.5 mL of 0.5% Marcaine and 0.5 mL of Kenalog 40 mg/dL was injected. Patient tolerated the procedure well. No pain after the injection. Post injection instructions given.    Impression and Recommendations:     This case required medical decision making of moderate complexity.      Note: This dictation was prepared with Dragon dictation along with smaller phrase technology. Any  transcriptional errors that result from this process are unintentional.

## 2016-03-30 ENCOUNTER — Encounter: Payer: Self-pay | Admitting: Family Medicine

## 2016-03-30 ENCOUNTER — Ambulatory Visit: Payer: Self-pay

## 2016-03-30 ENCOUNTER — Ambulatory Visit (INDEPENDENT_AMBULATORY_CARE_PROVIDER_SITE_OTHER): Payer: PRIVATE HEALTH INSURANCE | Admitting: Family Medicine

## 2016-03-30 VITALS — BP 128/92 | HR 72 | Ht 66.5 in | Wt 196.0 lb

## 2016-03-30 DIAGNOSIS — M25551 Pain in right hip: Secondary | ICD-10-CM

## 2016-03-30 DIAGNOSIS — G5601 Carpal tunnel syndrome, right upper limb: Secondary | ICD-10-CM | POA: Insufficient documentation

## 2016-03-30 DIAGNOSIS — M1611 Unilateral primary osteoarthritis, right hip: Secondary | ICD-10-CM | POA: Diagnosis not present

## 2016-03-30 DIAGNOSIS — M25531 Pain in right wrist: Secondary | ICD-10-CM | POA: Diagnosis not present

## 2016-03-30 MED ORDER — PREDNISONE 50 MG PO TABS: 50.0000 mg | ORAL_TABLET | Freq: Every day | ORAL | 0 refills | Status: DC

## 2016-03-30 MED ORDER — GABAPENTIN 100 MG PO CAPS: 200.0000 mg | ORAL_CAPSULE | Freq: Every day | ORAL | 3 refills | Status: DC

## 2016-03-30 NOTE — Assessment & Plan Note (Signed)
Patient was injected today. Tolerated the procedure well. We discussed icing regimen and home exercises. Bracing at night. Patient will continue this and come back again in 4-6 weeks.

## 2016-03-30 NOTE — Patient Instructions (Addendum)
Good to see you  Alvera Singh is your friend.  Stay active.  Will send in prednisone if in pain for 5 days for your trip Gabapentin 1-2 pills at night could help with sleep and help the wrist pain  Injected the hip and the right hand today  Wear wrist brace at night Yohimbe could be something you can try  pennsaid pinkie amount topically 2 times daily as needed.

## 2016-03-30 NOTE — Assessment & Plan Note (Signed)
Patient was given an injection and tolerated the procedure well. We discussed icing regimen and home exercises. We discussed which activities to do a which ones to avoid. Encourage patient to try to stay active. Discussed with patient he would likely need a replacement would like to wait until you 66 years of age. Patient will continue to be active. Follow-up again every 10-12 weeks if necessary.

## 2016-04-20 ENCOUNTER — Other Ambulatory Visit: Payer: Self-pay | Admitting: Internal Medicine

## 2016-08-22 ENCOUNTER — Ambulatory Visit (INDEPENDENT_AMBULATORY_CARE_PROVIDER_SITE_OTHER): Payer: PRIVATE HEALTH INSURANCE | Admitting: Family Medicine

## 2016-08-22 ENCOUNTER — Ambulatory Visit: Payer: Self-pay

## 2016-08-22 ENCOUNTER — Encounter: Payer: Self-pay | Admitting: Family Medicine

## 2016-08-22 VITALS — BP 128/86 | HR 68 | Ht 66.0 in | Wt 198.0 lb

## 2016-08-22 DIAGNOSIS — M1611 Unilateral primary osteoarthritis, right hip: Secondary | ICD-10-CM

## 2016-08-22 NOTE — Assessment & Plan Note (Signed)
Repeat injection given today. Tolerated procedure well. We discussed icing regimen and home exercises. Continue the over-the-counter medications. Patient knows that he will need a joint replacement at some point but is trying to hold off to all neck shear. She'll come back and see me again in 4-6 weeks for further evaluation and treatment if needed.

## 2016-08-22 NOTE — Patient Instructions (Addendum)
Good to see you  Ice is your friend Injected the hip and can repeat every 3 months.  Next time lets think of a xray of the hip  Continue the vitamins See me when you need me.

## 2016-08-22 NOTE — Progress Notes (Signed)
[name] [address], [place] [address] Phone: [phone] Subjective:    I'm seeing this patient by the request  of:    CC: Right hip pain Right wrist pain  LKG:[id number]  Brendan Bell is a 66 y.o. male coming in with complaint of right hip pain. Patient has known mild to moderate arthritic changes of his hip previously.Patient states was doing very well. Over the course last month started having worsening pain again. Patient states that severe enough that is stopping from daily activities and given him some difficulty where feels like it is giving out. Concern disease putting significant more pain and pressure on the left knee as well then. Wants to avoid this.  Patient is also complaining of right wrist pain. Found to have carpal tunnel. Doing very well with the injection. Patient is taking the gabapentin intermittently. No weakness no numbness..    Past Medical History:  Diagnosis Date  . Anxiety   . Depression   . Hyperlipidemia   . Osteoarthritis   . Pneumonia 10/04/98   out patient  . Rhinitis, allergic    Past Surgical History:  Procedure Laterality Date  . COLONOSCOPY    . EYE SURGERY Bilateral   . JOINT REPLACEMENT  2/13///9/13   medial right knee///medial left knee---Dr Jefm Bryant  . KNEE ARTHROSCOPY W/ PARTIAL MEDIAL MENISCECTOMY Right 2008   with pateller chondroplasty ([facility])  . KNEE SURGERY  1992   right, loose body removed  . KNEE SURGERY     multiple surgeries to both knees  . MEDIAL PARTIAL KNEE REPLACEMENT Left   . MEDIAL PARTIAL KNEE REPLACEMENT Right   . TONSILLECTOMY    . TOTAL KNEE ARTHROPLASTY WITH REVISION COMPONENTS Left 02/21/2015   Procedure: TOTAL KNEE ARTHROPLASTY WITH REVISION COMPONENTS;  Surgeon: Frederik Pear, MD;  Location: [facility];  Service: Orthopedics;  Laterality: Left;   Social History   Social History  . Marital status: Married    Spouse name: N/A  . Number of children: 5  . Years of  education: N/A   Occupational History  . 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 Landlord    Social History Main Topics  . Smoking status: Never Smoker  . Smokeless tobacco: Never Used  . Alcohol use Yes     Comment: 2 beers a day in general.  . Drug use: No  . Sexual activity: Not Asked   Other Topics Concern  . None   Social History Narrative   2nd marriage.  3 children from first marriage, 2 stepchildren, 2 with current marriage.   No Known Allergies Family History  Problem Relation Age of Onset  . Heart disease Mother        CAD, MI  . Hypertension Mother   . Stroke Father   . Aneurysm Father        died from rupture of AAA  . Cancer Paternal Grandfather        prostate    Past medical history, social, surgical and family history all reviewed in electronic medical record.  No pertanent information unless stated regarding to the chief complaint.   Review of Systems: No headache, visual changes, nausea, vomiting, diarrhea, constipation, dizziness, abdominal pain, skin rash, fevers, chills, night sweats, weight loss, swollen lymph nodes, body aches, joint swelling, muscle aches, chest pain, shortness of breath, mood changes.     Objective  Blood pressure 128/86, height 5\' 6"  (1.676 m), weight 198 lb (  89.8 kg). Systems examined below as of 08/22/16   Systems examined below as of 08/22/16 General: NAD A&O x3 mood, affect normal  HEENT: Pupils equal, extraocular movements intact no nystagmus Respiratory: not short of breath at rest or with speaking Cardiovascular: No lower extremity edema, non tender Skin: Warm dry intact with no signs of infection or rash on extremities or on axial skeleton. Abdomen: Soft nontender, no masses Neuro: Cranial nerves  intact, neurovascularly intact in all extremities with 2+ DTRs and 2+ pulses. Lymph: No lymphadenopathy appreciated today  Gait normal with good balance and coordination.  MSK: Non tender with full range of motion  and good stability and symmetric strength and tone of shoulders, elbows, wrist,  knee and ankles bilaterally.  Left-sided knee replacement  RCV:ELFYB ROM IR: 10 Deg with worsening pain ER: 45 Deg, Flexion: 120 Deg, Extension: 100 Deg, Abduction: 45 Deg, Adduction: 15 Deg Strength IR: 5/5, ER: 5/5, Flexion: 5/5, Extension: 5/5, Abduction: 4/5, Adduction: 4/5 Pelvic alignment unremarkable to inspection and palpation. Standing hip rotation and gait without trendelenburg sign / unsteadiness. Greater trochanter without tenderness to palpation. No tenderness over piriformis and greater trochanter. Severe pain with internal rotation No SI joint tenderness and normal minimal SI movement. Contralateral hip fairly unremarkable  Procedure: Real-time Ultrasound Guided Injection of right hip Device: GE Logiq Q7  Ultrasound guided injection is preferred based studies that show increased duration, increased effect, greater accuracy, decreased procedural pain, increased response rate with ultrasound guided versus blind injection.  Verbal informed consent obtained.  Time-out conducted.  Noted no overlying erythema, induration, or other signs of local infection.  Skin prepped in a sterile fashion.  Local anesthesia: Topical Ethyl chloride.  With sterile technique and under real time ultrasound guidance:  Anterior capsule visualized, needle visualized going to the head neck junction at the anterior capsule. Pictures taken. Patient did have injection of  3 cc of 0.5% Marcaine, and 1 cc of Kenalog 40 mg/dL. Completed without difficulty  Pain immediately resolved suggesting accurate placement of the medication.  Advised to call if fevers/chills, erythema, induration, drainage, or persistent bleeding.  Images permanently stored and available for review in the ultrasound unit.  Impression: Technically successful ultrasound guided injection.   Impression and Recommendations:     This case required medical  decision making of moderate complexity.      Note: This dictation was prepared with Dragon dictation along with smaller phrase technology. Any transcriptional errors that result from this process are unintentional.

## 2016-10-30 ENCOUNTER — Other Ambulatory Visit: Payer: Self-pay | Admitting: Internal Medicine

## 2017-01-15 NOTE — Progress Notes (Signed)
[name] [address], [place] [address] Phone: [phone] Subjective:     CC: right hip pain   Brendan  BANYAN Bell is a 66 y.o. male coming in for right hip pain. He is here for a hip injection. He continues to have pain daily and has pain with activity. Denies any pain at rest but does occasionally have pain with sleeping. He has had a few instances of sudden, sharp pain in which he almost fell.  Patient is having worsening symptoms.  Patient still has 9 months until he would be possibly able to get a hip replacement.      Past Medical History:  Diagnosis Date  . Anxiety   . Depression   . Hyperlipidemia   . Osteoarthritis   . Pneumonia 10/04/98   out patient  . Rhinitis, allergic    Past Surgical History:  Procedure Laterality Date  . COLONOSCOPY    . EYE SURGERY Bilateral   . JOINT REPLACEMENT  2/13///9/13   medial right knee///medial left knee---Dr Jefm Bryant  . KNEE ARTHROSCOPY W/ PARTIAL MEDIAL MENISCECTOMY Right 2008   with pateller chondroplasty ([facility])  . KNEE SURGERY  1992   right, loose body removed  . KNEE SURGERY     multiple surgeries to both knees  . MEDIAL PARTIAL KNEE REPLACEMENT Left   . MEDIAL PARTIAL KNEE REPLACEMENT Right   . TONSILLECTOMY    . TOTAL KNEE ARTHROPLASTY WITH REVISION COMPONENTS Left 02/21/2015   Procedure: TOTAL KNEE ARTHROPLASTY WITH REVISION COMPONENTS;  Surgeon: Frederik Pear, MD;  Location: [facility];  Service: Orthopedics;  Laterality: Left;   Social History   Socioeconomic History  . Marital status: Married    Spouse name: Not on file  . Number of children: 5  . Years of education: Not on file  . Highest education level: Not on file  Social Needs  . Financial resource strain: Not on file  . Food insecurity - worry: Not on file  . Food insecurity - inability: Not on file  . Transportation needs - medical: Not on file  . Transportation needs - non-medical: Not on file    Occupational History  . 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 Occupation: Landlord  Tobacco Use  . Smoking status: Never Smoker  . Smokeless tobacco: Never Used  Substance and Sexual Activity  . Alcohol use: Yes    Comment: 2 beers a day in general.  . Drug use: No  . Sexual activity: Not on file  Other Topics Concern  . Not on file  Social History Narrative   2nd marriage.  3 children from first marriage, 2 stepchildren, 2 with current marriage.   No Known Allergies Family History  Problem Relation Age of Onset  . Heart disease Mother        CAD, MI  . Hypertension Mother   . Stroke Father   . Aneurysm Father        died from rupture of AAA  . Cancer Paternal Grandfather        prostate     Past medical history, social, surgical and family history all reviewed in electronic medical record.  No pertanent information unless stated regarding to the chief complaint.   Review of Systems:Review of systems updated and as accurate as of 01/15/17  No headache, visual changes, nausea, vomiting, diarrhea, constipation, dizziness, abdominal pain, skin rash, fevers, chills, night sweats, weight loss, swollen lymph nodes, body  aches, joint swelling, muscle aches, chest pain, shortness of breath, mood changes.   Objective  There were no vitals taken for this visit. Systems examined below as of 01/15/17   General: No apparent distress alert and oriented x3 mood and affect normal, dressed appropriately.  HEENT: Pupils equal, extraocular movements intact  Respiratory: Patient's speak in full sentences and does not appear short of breath  Cardiovascular: No lower extremity edema, non tender, no erythema  Skin: Warm dry intact with no signs of infection or rash on extremities or on axial skeleton.  Abdomen: Soft nontender  Neuro: Cranial nerves II through XII are intact, neurovascularly intact in all extremities with 2+ DTRs and 2+ pulses.  Lymph: No  lymphadenopathy of posterior or anterior cervical chain or axillae bilaterally.  Gait antalgic gait MSK:  Non tender with full range of motion and good stability and symmetric strength and tone of shoulders, elbows, wrist,  knee and ankles bilaterally.  Right hip exam shows the patient does have loss of range of minimal internal rotation at all.  Negative straight leg test.  Patient does have some mild atrophy of the musculature of the right hip.  Procedure: Real-time Ultrasound Guided Injection of right hip Device: GE Logiq Q7  Ultrasound guided injection is preferred based studies that show increased duration, increased effect, greater accuracy, decreased procedural pain, increased response rate with ultrasound guided versus blind injection.  Verbal informed consent obtained.  Time-out conducted.  Noted no overlying erythema, induration, or other signs of local infection.  Skin prepped in a sterile fashion.  Local anesthesia: Topical Ethyl chloride.  With sterile technique and under real time ultrasound guidance:  Anterior capsule visualized, needle visualized going to the head neck junction at the anterior capsule. Pictures taken. Patient did have injection of  3 cc of 0.5% Marcaine, and 1 cc of Kenalog 40 mg/dL. Completed without difficulty  Pain immediately resolved suggesting accurate placement of the medication.  Advised to call if fevers/chills, erythema, induration, drainage, or persistent bleeding.  Images permanently stored and available for review in the ultrasound unit.  Impression: Technically successful ultrasound guided injection.   Impression and Recommendations:     This case required medical decision making of moderate complexity.      Note: This dictation was prepared with Dragon dictation along with smaller phrase technology. Any transcriptional errors that result from this process are unintentional.

## 2017-01-16 ENCOUNTER — Ambulatory Visit: Payer: Self-pay

## 2017-01-16 ENCOUNTER — Ambulatory Visit (INDEPENDENT_AMBULATORY_CARE_PROVIDER_SITE_OTHER): Payer: PRIVATE HEALTH INSURANCE | Admitting: Family Medicine

## 2017-01-16 ENCOUNTER — Encounter: Payer: Self-pay | Admitting: Family Medicine

## 2017-01-16 VITALS — BP 140/104 | HR 72 | Ht 67.0 in | Wt 203.0 lb

## 2017-01-16 DIAGNOSIS — M25551 Pain in right hip: Secondary | ICD-10-CM | POA: Diagnosis not present

## 2017-01-16 DIAGNOSIS — M1611 Unilateral primary osteoarthritis, right hip: Secondary | ICD-10-CM | POA: Diagnosis not present

## 2017-01-16 NOTE — Assessment & Plan Note (Signed)
Worsening symptoms.  Repeat injection given again today.  We discussed icing regimen and home exercises.  Discussed which activities to do which wants to avoid.  Encourage patient to see me every 3 months until patient is able to have blood replacement.

## 2017-01-16 NOTE — Patient Instructions (Signed)
Good to see you  Brendan Bell is your friend.  Try gabapentin at night I will read on the fungus a little See me again in 3 months

## 2017-03-21 ENCOUNTER — Encounter: Payer: Self-pay | Admitting: Internal Medicine

## 2017-03-21 ENCOUNTER — Ambulatory Visit (INDEPENDENT_AMBULATORY_CARE_PROVIDER_SITE_OTHER): Payer: PRIVATE HEALTH INSURANCE | Admitting: Internal Medicine

## 2017-03-21 VITALS — BP 130/82 | HR 78 | Temp 98.2°F | Ht 66.5 in | Wt 193.2 lb

## 2017-03-21 DIAGNOSIS — F3340 Major depressive disorder, recurrent, in remission, unspecified: Secondary | ICD-10-CM | POA: Diagnosis not present

## 2017-03-21 DIAGNOSIS — Z125 Encounter for screening for malignant neoplasm of prostate: Secondary | ICD-10-CM

## 2017-03-21 DIAGNOSIS — Z0000 Encounter for general adult medical examination without abnormal findings: Secondary | ICD-10-CM

## 2017-03-21 DIAGNOSIS — Z23 Encounter for immunization: Secondary | ICD-10-CM

## 2017-03-21 MED ORDER — TRIAMCINOLONE ACETONIDE 0.1 % EX CREA: 1.0000 "application " | TOPICAL_CREAM | Freq: Two times a day (BID) | CUTANEOUS | 1 refills | Status: DC | PRN

## 2017-03-21 NOTE — Assessment & Plan Note (Signed)
Will continue the citalopram (indefinitely)

## 2017-03-21 NOTE — Assessment & Plan Note (Signed)
Healthy Discussed PSA---will check Check sugar Defer other labs Td  TAC for skin lesion--needs evaluation if doesn't clear up

## 2017-03-21 NOTE — Addendum Note (Signed)
Addended by: Pilar Grammes on: 03/21/2017 04:37 PM   Modules accepted: Orders

## 2017-03-21 NOTE — Progress Notes (Signed)
Subjective:    Patient ID: Brendan Bell, male    DOB: 08/05/1950, 67 y.o.   MRN: [id number]  HPI Here for physical No new concerns Watching his diet---weight is down a bit.  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 citalopram (CELEXA) 40 MG tablet TAKE 1 TABLET (40 MG TOTAL) BY MOUTH DAILY. 90 tablet 1  . gabapentin (NEURONTIN) 100 MG capsule Take 2 capsules (200 mg total) by mouth at bedtime. (Patient not taking: Reported on 03/21/2017) 60 capsule 3   No current facility-administered medications on file prior to visit.     No Known Allergies  Past Medical History:  Diagnosis Date  . Anxiety   . Depression   . Hyperlipidemia   . Osteoarthritis   . Pneumonia 10/04/98   out patient  . Rhinitis, allergic     Past Surgical History:  Procedure Laterality Date  . COLONOSCOPY    . EYE SURGERY Bilateral   . JOINT REPLACEMENT  2/13///9/13   medial right knee///medial left knee---Dr Jefm Bryant  . KNEE ARTHROSCOPY W/ PARTIAL MEDIAL MENISCECTOMY Right 2008   with pateller chondroplasty ([facility])  . KNEE SURGERY  1992   right, loose body removed  . KNEE SURGERY     multiple surgeries to both knees  . MEDIAL PARTIAL KNEE REPLACEMENT Left   . MEDIAL PARTIAL KNEE REPLACEMENT Right   . TONSILLECTOMY    . TOTAL KNEE ARTHROPLASTY WITH REVISION COMPONENTS Left 02/21/2015   Procedure: TOTAL KNEE ARTHROPLASTY WITH REVISION COMPONENTS;  Surgeon: Frederik Pear, MD;  Location: [facility];  Service: Orthopedics;  Laterality: Left;    Family History  Problem Relation Age of Onset  . Heart disease Mother        CAD, MI  . Hypertension Mother   .  Stroke Father   . Aneurysm Father        died from rupture of AAA  . Cancer Paternal Grandfather        prostate    Social History   Socioeconomic History  . Marital status: Married    Spouse name: Not on file  . Number of children: 5  . Years of education: Not on file  . Highest education level: Not on file  Social Needs  . Financial resource strain: Not on file  . Food insecurity - worry: Not on file  . Food insecurity - inability: Not on file  . Transportation needs - medical: Not on file  . Transportation needs - non-medical: Not on file  Occupational History  . 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 Occupation: Landlord  Tobacco Use  . Smoking status: Never Smoker  . Smokeless tobacco: Never Used  Substance and Sexual Activity  . Alcohol use: Yes    Comment: 2 beers a day in general.  . Drug use: No  . Sexual activity: Not on file  Other Topics Concern  . Not on file  Social History Narrative   2nd marriage.  3 children from first marriage, 2 stepchildren, 2 with current marriage.   Review of Systems  Constitutional: Negative for fatigue and unexpected weight change.  Wears seat belt  HENT: Negative for dental problem, hearing loss and tinnitus.        Keeps up with dentist  Eyes: Negative for visual disturbance.       No diplopia or unilateral vision loss Still gets flashes--- acephalic migraine  Gastrointestinal: Negative for abdominal pain, blood in stool and constipation.  Endocrine: Negative for polydipsia and polyuria.  Genitourinary: Negative for difficulty urinating and urgency.       No sig ED  Musculoskeletal: Positive for arthralgias. Negative for joint swelling.  Skin: Negative for rash.       No suspicious lesions  Allergic/Immunologic: Negative for environmental allergies and immunocompromised state.  Neurological: Negative for syncope and headaches.       Lightheaded with recent illness---not  otherwise Gabapentin for CTS--rarely takes  Hematological: Negative for adenopathy. Does not bruise/bleed easily.  Psychiatric/Behavioral: Negative for dysphoric mood and sleep disturbance. The patient is not nervous/anxious.        Objective:   Physical Exam  Constitutional: He is oriented to person, place, and time. No distress.  HENT:  Head: Normocephalic and atraumatic.  Right Ear: External ear normal.  Left Ear: External ear normal.  Mouth/Throat: Oropharynx is clear and moist. No oropharyngeal exudate.  Eyes: Conjunctivae are normal. Pupils are equal, round, and reactive to light.  Neck: Normal range of motion. No thyromegaly present.  Cardiovascular: Normal rate, regular rhythm, normal heart sounds and intact distal pulses. Exam reveals no gallop.  No murmur heard. Pulmonary/Chest: Effort normal and breath sounds normal. No respiratory distress. He has no wheezes. He has no rales.  Abdominal: Soft. There is no tenderness.  Musculoskeletal: He exhibits no edema or tenderness.  Lymphadenopathy:    He has no cervical adenopathy.  Neurological: He is alert and oriented to person, place, and time.  Skin:  37mm triangular scaly area on posterior right axilla.  ?traumatic? Full body skin exam done  Psychiatric: He has a normal mood and affect. His behavior is normal.          Assessment & Plan:

## 2017-03-21 NOTE — Patient Instructions (Addendum)
I recommend Dr Zollie Beckers for the hip. You need to have that spot on your back biopsied if it doesn't go away with the cream.

## 2017-04-02 ENCOUNTER — Other Ambulatory Visit (INDEPENDENT_AMBULATORY_CARE_PROVIDER_SITE_OTHER): Payer: PRIVATE HEALTH INSURANCE

## 2017-04-02 DIAGNOSIS — Z0000 Encounter for general adult medical examination without abnormal findings: Secondary | ICD-10-CM | POA: Diagnosis not present

## 2017-04-02 DIAGNOSIS — Z125 Encounter for screening for malignant neoplasm of prostate: Secondary | ICD-10-CM

## 2017-04-02 LAB — PSA: PSA: 1.25 ng/mL (ref 0.10–4.00)

## 2017-04-02 LAB — GLUCOSE, RANDOM: Glucose, Bld: 78 mg/dL (ref 70–99)

## 2017-04-17 ENCOUNTER — Ambulatory Visit: Payer: PRIVATE HEALTH INSURANCE | Admitting: Family Medicine

## 2017-05-12 ENCOUNTER — Other Ambulatory Visit: Payer: Self-pay | Admitting: Internal Medicine

## 2017-05-15 ENCOUNTER — Encounter: Payer: Self-pay | Admitting: Family Medicine

## 2017-05-15 ENCOUNTER — Ambulatory Visit: Payer: Self-pay

## 2017-05-15 ENCOUNTER — Ambulatory Visit (INDEPENDENT_AMBULATORY_CARE_PROVIDER_SITE_OTHER): Payer: PRIVATE HEALTH INSURANCE | Admitting: Family Medicine

## 2017-05-15 VITALS — BP 122/86 | HR 75 | Wt 197.0 lb

## 2017-05-15 DIAGNOSIS — M25551 Pain in right hip: Secondary | ICD-10-CM | POA: Diagnosis not present

## 2017-05-15 DIAGNOSIS — M1611 Unilateral primary osteoarthritis, right hip: Secondary | ICD-10-CM | POA: Diagnosis not present

## 2017-05-15 NOTE — Assessment & Plan Note (Signed)
Patient given another injection.  Tolerated the procedure well.  We discussed icing regimen and home exercises.  Patient is encouraged to consider the joint replacement which patient is considering in August or September of this year.  Patient given names and well do some research before referral is made.  Can repeat again injection if necessary 1 more time in 10 weeks.

## 2017-05-15 NOTE — Progress Notes (Signed)
[name] [place], [place] [address] Phone: [phone] Subjective:     CC: Right hip pain  IPJ:[id number]  Brendan Bell is a 67 y.o. male coming in with complaint of right hip pain. He did have some relief from the injection given previously and states that his pain is returning. He feels that this will be his last injection before he moves forward with getting a hip replacement.  Patient is having worsening symptoms.  Patient states that is affecting daily activities.  Supposed to be traveling out of the country in the near future.      Past Medical History:  Diagnosis Date  . Anxiety   . Depression   . Hyperlipidemia   . Osteoarthritis   . Pneumonia 10/04/98   out patient  . Rhinitis, allergic    Past Surgical History:  Procedure Laterality Date  . COLONOSCOPY    . EYE SURGERY Bilateral   . JOINT REPLACEMENT  2/13///9/13   medial right knee///medial left knee---Dr Jefm Bryant  . KNEE ARTHROSCOPY W/ PARTIAL MEDIAL MENISCECTOMY Right 2008   with pateller chondroplasty ([facility])  . KNEE SURGERY  1992   right, loose body removed  . KNEE SURGERY     multiple surgeries to both knees  . MEDIAL PARTIAL KNEE REPLACEMENT Left   . MEDIAL PARTIAL KNEE REPLACEMENT Right   . TONSILLECTOMY    . TOTAL KNEE ARTHROPLASTY WITH REVISION COMPONENTS Left 02/21/2015   Procedure: TOTAL KNEE ARTHROPLASTY WITH REVISION COMPONENTS;  Surgeon: Frederik Pear, MD;  Location: [facility];  Service: Orthopedics;  Laterality: Left;   Social History   Socioeconomic History  . Marital status: Married    Spouse name: Not on file  . Number of children: 5  . Years of education: Not on file  . Highest education level: Not on file  Occupational History  . 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 Occupation: Landlord  Social Needs  . Financial resource strain: Not on file  . Food insecurity:    Worry: Not on file    Inability: Not on file   . Transportation needs:    Medical: Not on file    Non-medical: Not on file  Tobacco Use  . Smoking status: Never Smoker  . Smokeless tobacco: Never Used  Substance and Sexual Activity  . Alcohol use: Yes    Comment: 2 beers a day in general.  . Drug use: No  . Sexual activity: Not on file  Lifestyle  . Physical activity:    Days per week: Not on file    Minutes per session: Not on file  . Stress: Not on file  Relationships  . Social connections:    Talks on phone: Not on file    Gets together: Not on file    Attends religious service: Not on file    Active member of club or organization: Not on file    Attends meetings of clubs or organizations: Not on file    Relationship status: Not on file  Other Topics Concern  . Not on file  Social History Narrative   2nd marriage.  3 children from first marriage, 2 stepchildren, 2 with current marriage.   No Known Allergies Family History  Problem Relation Age of Onset  . Heart disease Mother        CAD, MI  . Hypertension Mother   . Stroke Father   . Aneurysm Father  died from rupture of AAA  . Cancer Paternal Grandfather        prostate     Past medical history, social, surgical and family history all reviewed in electronic medical record.  No pertanent information unless stated regarding to the chief complaint.   Review of Systems:Review of systems updated and as accurate as of 05/15/17  No headache, visual changes, nausea, vomiting, diarrhea, constipation, dizziness, abdominal pain, skin rash, fevers, chills, night sweats, weight loss, swollen lymph nodes, body aches, joint swelling, chest pain, shortness of breath, mood changes.  Positive muscle aches  Objective  Blood pressure 122/86, pulse 75, weight 197 lb (89.4 kg), SpO2 98 %. Systems examined below as of 05/15/17   General: No apparent distress alert and oriented x3 mood and affect normal, dressed appropriately.  HEENT: Pupils equal, extraocular movements  intact  Respiratory: Patient's speak in full sentences and does not appear short of breath  Cardiovascular: No lower extremity edema, non tender, no erythema  Skin: Warm dry intact with no signs of infection or rash on extremities or on axial skeleton.  Abdomen: Soft nontender  Neuro: Cranial nerves II through XII are intact, neurovascularly intact in all extremities with 2+ DTRs and 2+ pulses.  Lymph: No lymphadenopathy of posterior or anterior cervical chain or axillae bilaterally.  Gait antalgic gait walks with an externally rotated shortened right leg.  MSK:  Non tender with full range of motion and good stability and symmetric strength and tone of shoulders, elbows, wrist,  knee and ankles bilaterally.  Right hip exam his minimal internal range of motion with increasing pain.  Patient does have crepitus with range of motion.  4 out of 5 strength compared to the contralateral side.  Negative straight leg test.  Procedure: Real-time Ultrasound Guided Injection of right hip Device: GE Logiq Q7  Ultrasound guided injection is preferred based studies that show increased duration, increased effect, greater accuracy, decreased procedural pain, increased response rate with ultrasound guided versus blind injection.  Verbal informed consent obtained.  Time-out conducted.  Noted no overlying erythema, induration, or other signs of local infection.  Skin prepped in a sterile fashion.  Local anesthesia: Topical Ethyl chloride.  With sterile technique and under real time ultrasound guidance:  Anterior capsule visualized, needle visualized going to the head neck junction at the anterior capsule. Pictures taken. Patient did have injection of  3 cc of 0.5% Marcaine, and 1 cc of Kenalog 40 mg/dL. Completed without difficulty  Pain immediately resolved suggesting accurate placement of the medication.  Advised to call if fevers/chills, erythema, induration, drainage, or persistent bleeding.  Images  permanently stored and available for review in the ultrasound unit.  Impression: Technically successful ultrasound guided injection.    Impression and Recommendations:     This case required medical decision making of moderate complexity.      Note: This dictation was prepared with Dragon dictation along with smaller phrase technology. Any transcriptional errors that result from this process are unintentional.

## 2017-05-15 NOTE — Patient Instructions (Signed)
[facility] and [place] would be great

## 2017-08-19 NOTE — Progress Notes (Signed)
[name] [address] [place], [place] [address] Phone: [phone] Subjective:    CC: Right hip pain  [id number]  Brendan Bell is a 67 y.o. male coming in with complaint of hip pain. He is having a consultation at the end of August with [facility]. His pain has increased and feels like it is going through the entire joint. Pain with every step. Is going to [facility] soon and would like to be able to walk without pain.  Patient will be considering the possibility of surgical replacement soon.     Past Medical History:  Diagnosis Date  . Anxiety   . Depression   . Hyperlipidemia   . Osteoarthritis   . Pneumonia 10/04/98   out patient  . Rhinitis, allergic    Past Surgical History:  Procedure Laterality Date  . COLONOSCOPY    . EYE SURGERY Bilateral   . JOINT REPLACEMENT  2/13///9/13   medial right knee///medial left knee---Dr Jefm Bryant  . KNEE ARTHROSCOPY W/ PARTIAL MEDIAL MENISCECTOMY Right 2008   with pateller chondroplasty ([facility])  . KNEE SURGERY  1992   right, loose body removed  . KNEE SURGERY     multiple surgeries to both knees  . MEDIAL PARTIAL KNEE REPLACEMENT Left   . MEDIAL PARTIAL KNEE REPLACEMENT Right   . TONSILLECTOMY    . TOTAL KNEE ARTHROPLASTY WITH REVISION COMPONENTS Left 02/21/2015   Procedure: TOTAL KNEE ARTHROPLASTY WITH REVISION COMPONENTS;  Surgeon: Frederik Pear, MD;  Location: [facility];  Service: Orthopedics;  Laterality: Left;   Social History   Socioeconomic History  . Marital status: Married    Spouse name: Not on file  . Number of children: 5  . Years of education: Not on file  . Highest education level: Not on file  Occupational History  . 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 Occupation: Landlord  Social Needs  . Financial resource strain: Not on file  . Food insecurity:    Worry: Not on file    Inability: Not on file  . Transportation needs:    Medical: Not on file   Non-medical: Not on file  Tobacco Use  . Smoking status: Never Smoker  . Smokeless tobacco: Never Used  Substance and Sexual Activity  . Alcohol use: Yes    Comment: 2 beers a day in general.  . Drug use: No  . Sexual activity: Not on file  Lifestyle  . Physical activity:    Days per week: Not on file    Minutes per session: Not on file  . Stress: Not on file  Relationships  . Social connections:    Talks on phone: Not on file    Gets together: Not on file    Attends religious service: Not on file    Active member of club or organization: Not on file    Attends meetings of clubs or organizations: Not on file    Relationship status: Not on file  Other Topics Concern  . Not on file  Social History Narrative   2nd marriage.  3 children from first marriage, 2 stepchildren, 2 with current marriage.   No Known Allergies Family History  Problem Relation Age of Onset  . Heart disease Mother        CAD, MI  . Hypertension Mother   . Stroke Father   . Aneurysm Father        died from rupture of AAA  . Cancer  Paternal Grandfather        prostate     Past medical history, social, surgical and family history all reviewed in electronic medical record.  No pertanent information unless stated regarding to the chief complaint.   Review of Systems:Review of systems updated and as accurate as of 08/20/17  No headache, visual changes, nausea, vomiting, diarrhea, constipation, dizziness, abdominal pain, skin rash, fevers, chills, night sweats, weight loss, swollen lymph nodes, body aches, joint swelling,  chest pain, shortness of breath, mood changes.  Positive muscle aches  Objective  Blood pressure 128/82, pulse 70, height 5' 6.5" (1.689 m), weight 188 lb (85.3 kg), SpO2 96 %. Systems examined below as of 08/20/17   General: No apparent distress alert and oriented x3 mood and affect normal, dressed appropriately.  HEENT: Pupils equal, extraocular movements intact  Respiratory:  Patient's speak in full sentences and does not appear short of breath  Cardiovascular: No lower extremity edema, non tender, no erythema  Skin: Warm dry intact with no signs of infection or rash on extremities or on axial skeleton.  Abdomen: Soft nontender  Neuro: Cranial nerves II through XII are intact, neurovascularly intact in all extremities with 2+ DTRs and 2+ pulses.  Lymph: No lymphadenopathy of posterior or anterior cervical chain or axillae bilaterally.  Gait antalgic MSK:  Non tender with full range of motion and good stability and symmetric strength and tone of shoulders, elbows, wrist, knee and ankles bilaterally.  Mild arthritic changes Hip: Right ROM IR: 10 Deg, ER: 45 Deg, Flexion: 120 Deg, Extension: 100 Deg, Abduction: 45 Deg, Adduction: 15 Deg Strength 4 out of 5 compared to the contralateral side in all planes Pelvic alignment unremarkable to inspection and palpation. Standing hip rotation and gait without trendelenburg sign / unsteadiness. Greater trochanter without tenderness to palpation. No tenderness over piriformis and greater trochanter. Severe pain with internal rotation No SI joint tenderness and normal minimal SI movement.   Procedure: Real-time Ultrasound Guided Injection of right hip Device: GE Logiq Q7  Ultrasound guided injection is preferred based studies that show increased duration, increased effect, greater accuracy, decreased procedural pain, increased response rate with ultrasound guided versus blind injection.  Verbal informed consent obtained.  Time-out conducted.  Noted no overlying erythema, induration, or other signs of local infection.  Skin prepped in a sterile fashion.  Local anesthesia: Topical Ethyl chloride.  With sterile technique and under real time ultrasound guidance:  Anterior capsule visualized, needle visualized going to the head neck junction at the anterior capsule. Pictures taken. Patient did have injection of  3 cc of 0.5%  Marcaine, and 1 cc of Kenalog 40 mg/dL. Completed without difficulty  Pain immediately resolved suggesting accurate placement of the medication.  Advised to call if fevers/chills, erythema, induration, drainage, or persistent bleeding.  Images permanently stored and available for review in the ultrasound unit.  Impression: Technically successful ultrasound guided injection.   Impression and Recommendations:     This case required medical decision making of moderate complexity.      Note: This dictation was prepared with Dragon dictation along with smaller phrase technology. Any transcriptional errors that result from this process are unintentional.

## 2017-08-20 ENCOUNTER — Ambulatory Visit: Payer: Self-pay

## 2017-08-20 ENCOUNTER — Encounter: Payer: Self-pay | Admitting: Family Medicine

## 2017-08-20 ENCOUNTER — Ambulatory Visit (INDEPENDENT_AMBULATORY_CARE_PROVIDER_SITE_OTHER): Payer: PRIVATE HEALTH INSURANCE | Admitting: Family Medicine

## 2017-08-20 VITALS — BP 128/82 | HR 70 | Ht 66.5 in | Wt 188.0 lb

## 2017-08-20 DIAGNOSIS — M1611 Unilateral primary osteoarthritis, right hip: Secondary | ICD-10-CM | POA: Diagnosis not present

## 2017-08-20 DIAGNOSIS — M25551 Pain in right hip: Secondary | ICD-10-CM

## 2017-08-20 NOTE — Patient Instructions (Signed)
Good to see you  Hopefully this gets you to the date.  Continue everything else you doing.  You know where I am if you need me

## 2017-08-20 NOTE — Assessment & Plan Note (Addendum)
Injected again, significant worsening symptoms, patient is going to be following up with his surgeon to discuss options.  Discussed icing regimen.  Follow-up again in 10 weeks if necessary.

## 2017-09-12 DIAGNOSIS — M25562 Pain in left knee: Secondary | ICD-10-CM | POA: Diagnosis not present

## 2017-09-19 DIAGNOSIS — M1611 Unilateral primary osteoarthritis, right hip: Secondary | ICD-10-CM | POA: Diagnosis not present

## 2017-09-19 DIAGNOSIS — M25551 Pain in right hip: Secondary | ICD-10-CM | POA: Diagnosis not present

## 2017-10-15 ENCOUNTER — Encounter: Payer: Self-pay | Admitting: Internal Medicine

## 2017-10-15 ENCOUNTER — Ambulatory Visit (INDEPENDENT_AMBULATORY_CARE_PROVIDER_SITE_OTHER): Payer: Medicare Other | Admitting: Internal Medicine

## 2017-10-15 VITALS — BP 130/84 | HR 72 | Temp 98.0°F | Ht 67.0 in | Wt 185.0 lb

## 2017-10-15 DIAGNOSIS — Z01818 Encounter for other preprocedural examination: Secondary | ICD-10-CM

## 2017-10-15 NOTE — Assessment & Plan Note (Addendum)
Seems to be healthy Has worked hard on fitness---so should have quicker rehab course Improved exercise tolerance from his exercise---so seems to be low risk EKG--- sinus at 62. Normal intervals. Left axis deviation. No ischemic changes. Since 02/10/15, T inverted in V1 (probably a normal variant) Cleared for surgery

## 2017-10-15 NOTE — Progress Notes (Signed)
Subjective:    Patient ID: Brendan Bell, male    DOB: 01-02-1951, 67 y.o.   MRN: [id number]  HPI Here for preoperative evaluation  Right THR scheduled for December  No acute illnesses No respiratory difficulty or cough No SOB  No chest pain No palpitations No edema  Using tylenol for his ongoing pain Has worked hard on quad strengthening, etc  Has been taking CBD oil (orally) Pain is better--but started it at the same time as his enhanced fitness routine  Current Outpatient Medications on File Prior to Visit  Medication Sig Dispense Refill  . citalopram (CELEXA) 40 MG tablet TAKE 1 TABLET (40 MG TOTAL) BY MOUTH DAILY. 90 tablet 3  . triamcinolone cream (KENALOG) 0.1 % Apply 1 application topically 2 (two) times daily as needed. 45 g 1   No current facility-administered medications on file prior to visit.     No Known Allergies  Past Medical History:  Diagnosis Date  . Anxiety   . Depression   . Hyperlipidemia   . Osteoarthritis   . Pneumonia 10/04/98   out patient  . Rhinitis, allergic     Past Surgical History:  Procedure Laterality Date  . COLONOSCOPY    . EYE SURGERY Bilateral   . JOINT REPLACEMENT  2/13///9/13   medial right knee///medial left knee---Dr Jefm Bryant  . KNEE ARTHROSCOPY W/ PARTIAL MEDIAL MENISCECTOMY Right 2008   with pateller chondroplasty ([facility])  . KNEE SURGERY  1992   right, loose body removed  . KNEE SURGERY     multiple surgeries to both knees  . MEDIAL PARTIAL KNEE REPLACEMENT Left   . MEDIAL PARTIAL KNEE REPLACEMENT Right   . TONSILLECTOMY    . TOTAL KNEE ARTHROPLASTY WITH REVISION COMPONENTS Left 02/21/2015   Procedure: TOTAL KNEE ARTHROPLASTY WITH REVISION COMPONENTS;  Surgeon: Frederik Pear, MD;  Location: [facility];  Service: Orthopedics;  Laterality: Left;    Family History  Problem Relation Age of Onset  . Heart disease Mother        CAD, MI  . Hypertension Mother   . Stroke Father   . Aneurysm Father        died from  rupture of AAA  . Cancer Paternal Grandfather        prostate    Social History   Socioeconomic History  . Marital status: Married    Spouse name: Not on file  . Number of children: 5  . Years of education: Not on file  . Highest education level: Not on file  Occupational History  . 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 Occupation: Landlord  Social Needs  . Financial resource strain: Not on file  . Food insecurity:    Worry: Not on file    Inability: Not on file  . Transportation needs:    Medical: Not on file    Non-medical: Not on file  Tobacco Use  . Smoking status: Never Smoker  . Smokeless tobacco: Never Used  Substance and Sexual Activity  . Alcohol use: Yes    Comment: 2 beers a day in general.  . Drug use: No  . Sexual activity: Not on file  Lifestyle  . Physical activity:    Days per week: Not on file    Minutes per session: Not on file  . Stress: Not on file  Relationships  . Social connections:    Talks on phone: Not on file    Gets together: Not on file  Attends religious service: Not on file    Active member of club or organization: Not on file    Attends meetings of clubs or organizations: Not on file    Relationship status: Not on file  . Intimate partner violence:    Fear of current or ex partner: Not on file    Emotionally abused: Not on file    Physically abused: Not on file    Forced sexual activity: Not on file  Other Topics Concern  . Not on file  Social History Narrative   2nd marriage.  3 children from first marriage, 2 stepchildren, 2 with current marriage.   Review of Systems Sleeps well Appetite is good Weight is down some---working on fitness (has personal trainer, etc)    Objective:   Physical Exam  Constitutional: He appears well-developed. No distress.  Neck: No thyromegaly present.  Cardiovascular: Normal rate, regular rhythm, normal heart sounds and intact distal pulses. Exam reveals no  gallop.  No murmur heard. Respiratory: Effort normal and breath sounds normal. No respiratory distress. He has no wheezes. He has no rales.  GI: Soft. There is no tenderness.  Musculoskeletal: He exhibits no edema.  Lymphadenopathy:    He has no cervical adenopathy.  Psychiatric: He has a normal mood and affect. His behavior is normal.           Assessment & Plan:

## 2017-12-10 ENCOUNTER — Other Ambulatory Visit: Payer: Self-pay | Admitting: Orthopedic Surgery

## 2017-12-24 ENCOUNTER — Other Ambulatory Visit: Payer: Self-pay

## 2017-12-24 ENCOUNTER — Encounter (HOSPITAL_COMMUNITY): Payer: Self-pay

## 2017-12-24 ENCOUNTER — Encounter (HOSPITAL_COMMUNITY)
Admission: RE | Admit: 2017-12-24 | Discharge: 2017-12-24 | Disposition: A | Discharge status: Home / Self Care | Payer: Medicare Other | Source: Ambulatory Visit | Attending: Orthopedic Surgery | Admitting: Orthopedic Surgery

## 2017-12-24 DIAGNOSIS — Z01812 Encounter for preprocedural laboratory examination: Secondary | ICD-10-CM | POA: Insufficient documentation

## 2017-12-24 DIAGNOSIS — M1611 Unilateral primary osteoarthritis, right hip: Secondary | ICD-10-CM | POA: Diagnosis not present

## 2017-12-24 LAB — COMPREHENSIVE METABOLIC PANEL
ALT: 26 U/L (ref 0–44)
ANION GAP: 9 (ref 5–15)
AST: 27 U/L (ref 15–41)
Albumin: 4.3 g/dL (ref 3.5–5.0)
Alkaline Phosphatase: 61 U/L (ref 38–126)
BUN: 21 mg/dL (ref 8–23)
CO2: 23 mmol/L (ref 22–32)
CREATININE: 1.07 mg/dL (ref 0.61–1.24)
Calcium: 9.3 mg/dL (ref 8.9–10.3)
Chloride: 107 mmol/L (ref 98–111)
Glucose, Bld: 89 mg/dL (ref 70–99)
Potassium: 4.3 mmol/L (ref 3.5–5.1)
SODIUM: 139 mmol/L (ref 135–145)
TOTAL PROTEIN: 7.4 g/dL (ref 6.5–8.1)
Total Bilirubin: 0.7 mg/dL (ref 0.3–1.2)

## 2017-12-24 LAB — CBC
HCT: 49.8 % (ref 39.0–52.0)
Hemoglobin: 16.4 g/dL (ref 13.0–17.0)
MCH: 30.3 pg (ref 26.0–34.0)
MCHC: 32.9 g/dL (ref 30.0–36.0)
MCV: 92.1 fL (ref 80.0–100.0)
PLATELETS: 194 10*3/uL (ref 150–400)
RBC: 5.41 MIL/uL (ref 4.22–5.81)
RDW: 12.8 % (ref 11.5–15.5)
WBC: 5.8 10*3/uL (ref 4.0–10.5)
nRBC: 0 % (ref 0.0–0.2)

## 2017-12-24 LAB — SURGICAL PCR SCREEN
MRSA, PCR: NEGATIVE
STAPHYLOCOCCUS AUREUS: POSITIVE — AB

## 2017-12-24 LAB — PROTIME-INR
INR: 0.95
PROTHROMBIN TIME: 12.6 s (ref 11.4–15.2)

## 2017-12-24 LAB — APTT: aPTT: 35 seconds (ref 24–36)

## 2017-12-24 NOTE — Patient Instructions (Signed)
Brendan Bell  12/24/2017   Your procedure is scheduled on: Wednesday 01/08/2018  Report to [facility] Main  Entrance              Report to admitting at  0730  AM    Call this number if you have problems the morning of surgery [phone]    Remember: Do not eat food or drink liquids :After Midnight.              BRUSH YOUR TEETH MORNING OF SURGERY AND RINSE YOUR MOUTH OUT, NO CHEWING GUM CANDY OR MINTS.     Take these medicines the morning of surgery with A SIP OF WATER: Citalopram (Celexa)                                You may not have any metal on your body including hair pins and              piercings  Do not wear jewelry, make-up, lotions, powders or perfumes, deodorant                        Men may shave face and neck.   Do not bring valuables to the hospital. [name].  Contacts, dentures or bridgework may not be worn into surgery.  Leave suitcase in the car. After surgery it may be brought to your room.                  Please read over the following fact sheets you were given: _____________________________________________________________________             [facility] - Preparing for Surgery Before surgery, you can play an important role.  Because skin is not sterile, your skin needs to be as free of germs as possible.  You can reduce the number of germs on your skin by washing with CHG (chlorahexidine gluconate) soap before surgery.  CHG is an antiseptic cleaner which kills germs and bonds with the skin to continue killing germs even after washing. Please DO NOT use if you have an allergy to CHG or antibacterial soaps.  If your skin becomes reddened/irritated stop using the CHG and inform your nurse when you arrive at Short Stay. Do not shave (including legs and underarms) for at least 48 hours prior to the first CHG shower.  You may shave your face/neck. Please follow these  instructions carefully:  1.  Shower with CHG Soap the night before surgery and the  morning of Surgery.  2.  If you choose to wash your hair, wash your hair first as usual with your  normal  shampoo.  3.  After you shampoo, rinse your hair and body thoroughly to remove the  shampoo.                           4.  Use CHG as you would any other liquid soap.  You can apply chg directly  to the skin and wash                       Gently with a scrungie or clean washcloth.  5.  Apply the CHG Soap to your body ONLY FROM THE NECK DOWN.   Do not use on face/ open                           Wound or open sores. Avoid contact with eyes, ears mouth and genitals (private parts).                       Wash face,  Genitals (private parts) with your normal soap.             6.  Wash thoroughly, paying special attention to the area where your surgery  will be performed.  7.  Thoroughly rinse your body with warm water from the neck down.  8.  DO NOT shower/wash with your normal soap after using and rinsing off  the CHG Soap.                9.  Pat yourself dry with a clean towel.            10.  Wear clean pajamas.            11.  Place clean sheets on your bed the night of your first shower and do not  sleep with pets. Day of Surgery : Do not apply any lotions/deodorants the morning of surgery.  Please wear clean clothes to the hospital/surgery center.  FAILURE TO FOLLOW THESE INSTRUCTIONS MAY RESULT IN THE CANCELLATION OF YOUR SURGERY PATIENT SIGNATURE_________________________________  NURSE SIGNATURE__________________________________  ________________________________________________________________________   Brendan Bell  An incentive spirometer is a tool that can help keep your lungs clear and active. This tool measures how well you are filling your lungs with each breath. Taking long deep breaths may help reverse or decrease the chance of developing breathing (pulmonary) problems (especially  infection) following:  A long period of time when you are unable to move or be active. BEFORE THE PROCEDURE   If the spirometer includes an indicator to show your best effort, your nurse or respiratory therapist will set it to a desired goal.  If possible, sit up straight or lean slightly forward. Try not to slouch.  Hold the incentive spirometer in an upright position. INSTRUCTIONS FOR USE  1. Sit on the edge of your bed if possible, or sit up as far as you can in bed or on a chair. 2. Hold the incentive spirometer in an upright position. 3. Breathe out normally. 4. Place the mouthpiece in your mouth and seal your lips tightly around it. 5. Breathe in slowly and as deeply as possible, raising the piston or the ball toward the top of the column. 6. Hold your breath for 3-5 seconds or for as long as possible. Allow the piston or ball to fall to the bottom of the column. 7. Remove the mouthpiece from your mouth and breathe out normally. 8. Rest for a few seconds and repeat Steps 1 through 7 at least 10 times every 1-2 hours when you are awake. Take your time and take a few normal breaths between deep breaths. 9. The spirometer may include an indicator to show your best effort. Use the indicator as a goal to work toward during each repetition. 10. After each set of 10 deep breaths, practice coughing to be sure your lungs are clear. If you have an incision (the cut made at the time of surgery), support your incision when coughing by placing a pillow or  rolled up towels firmly against it. Once you are able to get out of bed, walk around indoors and cough well. You may stop using the incentive spirometer when instructed by your caregiver.  RISKS AND COMPLICATIONS  Take your time so you do not get dizzy or light-headed.  If you are in pain, you may need to take or ask for pain medication before doing incentive spirometry. It is harder to take a deep breath if you are having pain. AFTER  USE  Rest and breathe slowly and easily.  It can be helpful to keep track of a log of your progress. Your caregiver can provide you with a simple table to help with this. If you are using the spirometer at home, follow these instructions: [place] IF:   You are having difficultly using the spirometer.  You have trouble using the spirometer as often as instructed.  Your pain medication is not giving enough relief while using the spirometer.  You develop fever of 100.5 F (38.1 C) or higher. SEEK IMMEDIATE MEDICAL CARE IF:   You cough up bloody sputum that had not been present before.  You develop fever of 102 F (38.9 C) or greater.  You develop worsening pain at or near the incision site. MAKE SURE YOU:   Understand these instructions.  Will watch your condition.  Will get help right away if you are not doing well or get worse. Document Released: 05/28/2006 Document Revised: 04/09/2011 Document Reviewed: 07/29/2006 ExitCare Patient Information 2014 Exit[employer], [place].   ________________________________________________________________________  WHAT IS A BLOOD TRANSFUSION? Blood Transfusion Information  A transfusion is the replacement of blood or some of its parts. Blood is made up of multiple cells which provide different functions.  Red blood cells carry oxygen and are used for blood loss replacement.  White blood cells fight against infection.  Platelets control bleeding.  Plasma helps clot blood.  Other blood products are available for specialized needs, such as hemophilia or other clotting disorders. BEFORE THE TRANSFUSION  Who gives blood for transfusions?   Healthy volunteers who are fully evaluated to make sure their blood is safe. This is blood bank blood. Transfusion therapy is the safest it has ever been in the practice of medicine. Before blood is taken from a donor, a complete history is taken to make sure that person has no history of diseases  nor engages in risky social behavior (examples are intravenous drug use or sexual activity with multiple partners). The donor's travel history is screened to minimize risk of transmitting infections, such as malaria. The donated blood is tested for signs of infectious diseases, such as HIV and hepatitis. The blood is then tested to be sure it is compatible with you in order to minimize the chance of a transfusion reaction. If you or a relative donates blood, this is often done in anticipation of surgery and is not appropriate for emergency situations. It takes many days to process the donated blood. RISKS AND COMPLICATIONS Although transfusion therapy is very safe and saves many lives, the main dangers of transfusion include:   Getting an infectious disease.  Developing a transfusion reaction. This is an allergic reaction to something in the blood you were given. Every precaution is taken to prevent this. The decision to have a blood transfusion has been considered carefully by your caregiver before blood is given. Blood is not given unless the benefits outweigh the risks. AFTER THE TRANSFUSION  Right after receiving a blood transfusion, you will usually  feel much better and more energetic. This is especially true if your red blood cells have gotten low (anemic). The transfusion raises the level of the red blood cells which carry oxygen, and this usually causes an energy increase.  The nurse administering the transfusion will monitor you carefully for complications. HOME CARE INSTRUCTIONS  No special instructions are needed after a transfusion. You may find your energy is better. Speak with your caregiver about any limitations on activity for underlying diseases you may have. SEEK MEDICAL CARE IF:   Your condition is not improving after your transfusion.  You develop redness or irritation at the intravenous (IV) site. SEEK IMMEDIATE MEDICAL CARE IF:  Any of the following symptoms occur over the  next 12 hours:  Shaking chills.  You have a temperature by mouth above 102 F (38.9 C), not controlled by medicine.  Chest, back, or muscle pain.  People around you feel you are not acting correctly or are confused.  Shortness of breath or difficulty breathing.  Dizziness and fainting.  You get a rash or develop hives.  You have a decrease in urine output.  Your urine turns a dark color or changes to pink, red, or brown. Any of the following symptoms occur over the next 10 days:  You have a temperature by mouth above 102 F (38.9 C), not controlled by medicine.  Shortness of breath.  Weakness after normal activity.  The white part of the eye turns yellow (jaundice).  You have a decrease in the amount of urine or are urinating less often.  Your urine turns a dark color or changes to pink, red, or brown. Document Released: 01/13/2000 Document Revised: 04/09/2011 Document Reviewed: 09/01/2007 [facility] Patient Information 2014 [place], [place].  _______________________________________________________________________

## 2017-12-24 NOTE — Progress Notes (Signed)
   12/24/17 0845  OBSTRUCTIVE SLEEP APNEA  Have you ever been diagnosed with sleep apnea through a sleep study? No  Do you snore loudly (loud enough to be heard through closed doors)?  1  Do you often feel tired, fatigued, or sleepy during the daytime (such as falling asleep during driving or talking to someone)? 0  Has anyone observed you stop breathing during your sleep? 1  Do you have, or are you being treated for high blood pressure? 0  BMI more than 35 kg/m2? 0  Age > 50 (1-yes) 1  Neck circumference greater than:Male 16 inches or larger, Male 17inches or larger? 1  Male Gender (Yes=1) 1  Obstructive Sleep Apnea Score 5  Score 5 or greater  Results sent to PCP

## 2017-12-31 NOTE — H&P (Signed)
TOTAL HIP ADMISSION H&P  Patient is admitted for right total hip arthroplasty.  Subjective:  Chief Complaint: right hip pain  HPI: Brendan Bell, 67 y.o. male, has a history of pain and functional disability in the right hip(s) due to arthritis and patient has failed non-surgical conservative treatments for greater than 12 weeks to include corticosteriod injections and activity modification.  Onset of symptoms was gradual starting 3 years ago with gradually worsening course since that time.The patient noted no past surgery on the right hip(s).  Patient currently rates pain in the right hip at 9 out of 10 with activity. Patient has worsening of pain with activity and weight bearing and stiffness and instability. Patient has evidence of severe end-stage bone-on-bone osteoarthritis of the right hip with large subchondral cysts and large marginal osteophytes by imaging studies. This condition presents safety issues increasing the risk of falls. There is no current active infection.  Patient Active Problem List   Diagnosis Date Noted  . Preoperative examination 10/15/2017  . Right carpal tunnel syndrome 03/30/2016  . Arthritis of foot, left, degenerative 04/25/2015  . Arthritis of right hip 04/25/2015  . Loss of transverse plantar arch of left foot 04/25/2015  . Osteoarthritis of both knees 10/16/2011  . Routine general medical examination at a health care facility 06/19/2010  . Hyperlipemia 02/20/2007  . Recurrent major depression in remission ([place]) 02/20/2007  . ALLERGIC RHINITIS 02/20/2007   Past Medical History:  Diagnosis Date  . Anxiety   . Depression   . Hyperlipidemia   . Osteoarthritis   . Pneumonia 10/04/98   out patient  . Rhinitis, allergic     Past Surgical History:  Procedure Laterality Date  . COLONOSCOPY    . EYE SURGERY Bilateral   . JOINT REPLACEMENT  2/13///9/13   medial right knee///medial left knee---Dr Jefm Bryant  . KNEE ARTHROSCOPY W/ PARTIAL MEDIAL  MENISCECTOMY Right 2008   with pateller chondroplasty ([place])  . KNEE SURGERY  1992   right, loose body removed  . KNEE SURGERY     multiple surgeries to both knees  . MEDIAL PARTIAL KNEE REPLACEMENT Left    Dr. Jefm Bryant  . MEDIAL PARTIAL KNEE REPLACEMENT Right    Dr. Jefm Bryant  . TONSILLECTOMY    . TOTAL KNEE ARTHROPLASTY WITH REVISION COMPONENTS Left 02/21/2015   Procedure: TOTAL KNEE ARTHROPLASTY WITH REVISION COMPONENTS;  Surgeon: Frederik Pear, MD;  Location: [facility];  Service: Orthopedics;  Laterality: Left;    No current facility-administered medications for this encounter.    Current Outpatient Medications  Medication Sig Dispense Refill Last Dose  . acetaminophen (TYLENOL) 650 MG CR tablet Take 650 mg by mouth every 8 (eight) hours as needed for pain.     . citalopram (CELEXA) 40 MG tablet TAKE 1 TABLET (40 MG TOTAL) BY MOUTH DAILY. (Patient taking differently: Take 40 mg by mouth daily. ) 90 tablet 3 Taking  . meloxicam (MOBIC) 15 MG tablet Take 15 mg by mouth daily as needed for pain.     Marland Kitchen DHEA 25 MG CAPS Take 25 mg by mouth daily.     . Multiple Vitamins-Minerals (MULTIVITAMIN PO) Take 1 tablet by mouth daily.     Marland Kitchen OVER THE COUNTER MEDICATION Take 1 capsule by mouth daily. CBD Oil Supplement      No Known Allergies  Social History   Tobacco Use  . Smoking status: Never Smoker  . Smokeless tobacco: Never Used  Substance Use Topics  . Alcohol use: Yes  Comment: 2 beers a day in general.    Family History  Problem Relation Age of Onset  . Heart disease Mother        CAD, MI  . Hypertension Mother   . Stroke Father   . Aneurysm Father        died from rupture of AAA  . Cancer Paternal Grandfather        prostate     Review of Systems  Constitutional: Negative for chills and fever.  HENT: Negative for congestion, sore throat and tinnitus.   Eyes: Negative for double vision, photophobia and pain.  Respiratory: Negative for cough, shortness of breath and  wheezing.   Cardiovascular: Negative for chest pain, palpitations and orthopnea.  Gastrointestinal: Negative for heartburn, nausea and vomiting.  Genitourinary: Negative for dysuria, frequency and urgency.  Musculoskeletal: Positive for joint pain.  Neurological: Negative for dizziness, weakness and headaches.    Objective:  Physical Exam Well nourished and well developed. General: Alert and oriented x3, cooperative and pleasant, no acute distress. Head: normocephalic, atraumatic, neck supple. Eyes: EOMI. Respiratory: breath sounds clear in all fields, no wheezing, rales, or rhonchi. Cardiovascular: Regular rate and rhythm, no murmurs, gallops or rubs.  Abdomen: non-tender to palpation and soft, normoactive bowel sounds. Musculoskeletal: Antalgic gait favoring the right, without the use of assisted devices.  Right Hip Exam: ROM: Flexion to 90, Internal Rotation 0, External Rotation 10-20, and abduction 10-20 with pain during rotational movement/abduction. There is no tenderness over the greater trochanter bursa.  Left Hip Exam: ROM: is normal without discomfort. There is no tenderness over the greater trochanter bursa.  Right Knee Exam: No effusion. No Swelling. Well healed scar from previous uni-compartmental replacement. Range of motion is 0-125 degrees. No crepitus on range of motion of the knee. No medial joint line tenderness. No lateral joint line tenderness. Stable knee. Calves soft and nontender. Motor function intact in LE. Strength 5/5 LE bilaterally. Neuro: Distal pulses 2+. Sensation to light touch intact in LE.  Vital signs in last 24 hours:  Blood pressure: 126/88 mmHg Pulse: 76 bpm  Labs:   Estimated body mass index is 30.15 kg/m as calculated from the following:   Height as of 12/24/17: 5' 5.5" (1.664 m).   Weight as of 12/24/17: 83.5 kg.   Imaging Review Plain radiographs demonstrate severe degenerative joint disease of the right hip(s). The bone quality  appears to be adequate for age and reported activity level.    Preoperative templating of the joint replacement has been completed, documented, and submitted to the Operating Room personnel in order to optimize intra-operative equipment management.     Assessment/Plan:  End stage arthritis, right hip(s)  The patient history, physical examination, clinical judgement of the provider and imaging studies are consistent with end stage degenerative joint disease of the right hip(s) and total hip arthroplasty is deemed medically necessary. The treatment options including medical management, injection therapy, arthroscopy and arthroplasty were discussed at length. The risks and benefits of total hip arthroplasty were presented and reviewed. The risks due to aseptic loosening, infection, stiffness, dislocation/subluxation,  thromboembolic complications and other imponderables were discussed.  The patient acknowledged the explanation, agreed to proceed with the plan and consent was signed. Patient is being admitted for inpatient treatment for surgery, pain control, PT, OT, prophylactic antibiotics, VTE prophylaxis, progressive ambulation and ADL's and discharge planning.The patient is planning to be discharged home.   Therapy Plans: HEP Disposition: Home with spouse Planned DVT Prophylaxis: Aspirin 325 mg  BID DME needed: None PCP: Dr. Silvio Pate TXA: IV Allergies: NKDA Anesthesia Concerns: None BMI: 28.8  - Patient was instructed on what medications to stop prior to surgery. - Follow-up visit in 2 weeks with Dr. Wynelle Link - Begin physical therapy following surgery - Pre-operative lab work as pre-surgical testing - Prescriptions will be provided in hospital at time of discharge  Theresa Duty, PA-C Orthopedic Surgery [facility] [place]

## 2018-01-07 NOTE — Progress Notes (Signed)
10/15/2017- Medical Clearance from Dr. Silvio Pate on chart ,  office visit notes and EKG on chart

## 2018-01-08 ENCOUNTER — Inpatient Hospital Stay (HOSPITAL_COMMUNITY): Payer: Medicare Other | Admitting: Anesthesiology

## 2018-01-08 ENCOUNTER — Inpatient Hospital Stay (HOSPITAL_COMMUNITY)
Admission: RE | Admit: 2018-01-08 | Discharge: 2018-01-09 | DRG: 470 | Disposition: A | Discharge status: Home / Self Care | Payer: Medicare Other | Attending: Orthopedic Surgery | Admitting: Orthopedic Surgery

## 2018-01-08 ENCOUNTER — Inpatient Hospital Stay (HOSPITAL_COMMUNITY): Payer: Medicare Other

## 2018-01-08 ENCOUNTER — Encounter (HOSPITAL_COMMUNITY)
Admission: RE | Disposition: A | Discharge status: Home / Self Care | Payer: Self-pay | Source: Home / Self Care | Attending: Orthopedic Surgery

## 2018-01-08 ENCOUNTER — Other Ambulatory Visit: Payer: Self-pay

## 2018-01-08 ENCOUNTER — Encounter (HOSPITAL_COMMUNITY): Payer: Self-pay | Admitting: General Practice

## 2018-01-08 DIAGNOSIS — Z96653 Presence of artificial knee joint, bilateral: Secondary | ICD-10-CM | POA: Diagnosis present

## 2018-01-08 DIAGNOSIS — Z8249 Family history of ischemic heart disease and other diseases of the circulatory system: Secondary | ICD-10-CM

## 2018-01-08 DIAGNOSIS — Z419 Encounter for procedure for purposes other than remedying health state, unspecified: Secondary | ICD-10-CM

## 2018-01-08 DIAGNOSIS — Z79899 Other long term (current) drug therapy: Secondary | ICD-10-CM | POA: Diagnosis not present

## 2018-01-08 DIAGNOSIS — Z96641 Presence of right artificial hip joint: Secondary | ICD-10-CM | POA: Diagnosis not present

## 2018-01-08 DIAGNOSIS — Z823 Family history of stroke: Secondary | ICD-10-CM

## 2018-01-08 DIAGNOSIS — Z8042 Family history of malignant neoplasm of prostate: Secondary | ICD-10-CM | POA: Diagnosis not present

## 2018-01-08 DIAGNOSIS — M19072 Primary osteoarthritis, left ankle and foot: Secondary | ICD-10-CM | POA: Diagnosis not present

## 2018-01-08 DIAGNOSIS — E785 Hyperlipidemia, unspecified: Secondary | ICD-10-CM | POA: Diagnosis present

## 2018-01-08 DIAGNOSIS — M1611 Unilateral primary osteoarthritis, right hip: Principal | ICD-10-CM | POA: Diagnosis present

## 2018-01-08 DIAGNOSIS — F419 Anxiety disorder, unspecified: Secondary | ICD-10-CM | POA: Diagnosis not present

## 2018-01-08 DIAGNOSIS — J309 Allergic rhinitis, unspecified: Secondary | ICD-10-CM | POA: Diagnosis not present

## 2018-01-08 DIAGNOSIS — M169 Osteoarthritis of hip, unspecified: Secondary | ICD-10-CM | POA: Diagnosis present

## 2018-01-08 DIAGNOSIS — M25751 Osteophyte, right hip: Secondary | ICD-10-CM | POA: Diagnosis not present

## 2018-01-08 DIAGNOSIS — F3340 Major depressive disorder, recurrent, in remission, unspecified: Secondary | ICD-10-CM | POA: Diagnosis not present

## 2018-01-08 DIAGNOSIS — Z471 Aftercare following joint replacement surgery: Secondary | ICD-10-CM | POA: Diagnosis not present

## 2018-01-08 DIAGNOSIS — Z96649 Presence of unspecified artificial hip joint: Secondary | ICD-10-CM

## 2018-01-08 HISTORY — PX: TOTAL HIP ARTHROPLASTY: SHX124

## 2018-01-08 LAB — ABO/RH: ABO/RH(D): A POS

## 2018-01-08 LAB — TYPE AND SCREEN
ABO/RH(D): A POS
ANTIBODY SCREEN: NEGATIVE

## 2018-01-08 SURGERY — ARTHROPLASTY, HIP, TOTAL, ANTERIOR APPROACH
Anesthesia: Monitor Anesthesia Care | Site: Hip | Laterality: Right

## 2018-01-08 MED ORDER — LACTATED RINGERS IV SOLN
INTRAVENOUS | Status: DC
  Administered 2018-01-08 (×2): via INTRAVENOUS

## 2018-01-08 MED ORDER — BUPIVACAINE-EPINEPHRINE (PF) 0.25% -1:200000 IJ SOLN
INTRAMUSCULAR | Status: AC
  Filled 2018-01-08: qty 30

## 2018-01-08 MED ORDER — DEXAMETHASONE SODIUM PHOSPHATE 10 MG/ML IJ SOLN
10.0000 mg | Freq: Once | INTRAMUSCULAR | Status: AC
  Administered 2018-01-09: 10 mg via INTRAVENOUS
  Filled 2018-01-08: qty 1

## 2018-01-08 MED ORDER — MIDAZOLAM HCL 2 MG/2ML IJ SOLN
INTRAMUSCULAR | Status: AC
  Filled 2018-01-08: qty 2

## 2018-01-08 MED ORDER — METOCLOPRAMIDE HCL 5 MG PO TABS: 5.0000 mg | ORAL_TABLET | Freq: Three times a day (TID) | ORAL | Status: DC | PRN

## 2018-01-08 MED ORDER — ONDANSETRON HCL 4 MG PO TABS: 4.0000 mg | ORAL_TABLET | Freq: Four times a day (QID) | ORAL | Status: DC | PRN

## 2018-01-08 MED ORDER — SODIUM CHLORIDE 0.9 % IV SOLN
INTRAVENOUS | Status: DC
  Administered 2018-01-08 – 2018-01-09 (×2): via INTRAVENOUS

## 2018-01-08 MED ORDER — MENTHOL 3 MG MT LOZG: 1.0000 | LOZENGE | OROMUCOSAL | Status: DC | PRN

## 2018-01-08 MED ORDER — PROPOFOL 500 MG/50ML IV EMUL
INTRAVENOUS | Status: DC | PRN
  Administered 2018-01-08: 50 ug/kg/min via INTRAVENOUS

## 2018-01-08 MED ORDER — CEFAZOLIN SODIUM-DEXTROSE 2-4 GM/100ML-% IV SOLN
2.0000 g | INTRAVENOUS | Status: AC
  Administered 2018-01-08: 2 g via INTRAVENOUS
  Filled 2018-01-08: qty 100

## 2018-01-08 MED ORDER — MIDAZOLAM HCL 5 MG/5ML IJ SOLN
INTRAMUSCULAR | Status: DC | PRN
  Administered 2018-01-08: 2 mg via INTRAVENOUS

## 2018-01-08 MED ORDER — BUPIVACAINE-EPINEPHRINE (PF) 0.25% -1:200000 IJ SOLN
INTRAMUSCULAR | Status: DC | PRN
  Administered 2018-01-08: 30 mL via PERINEURAL

## 2018-01-08 MED ORDER — ACETAMINOPHEN 500 MG PO TABS
500.0000 mg | ORAL_TABLET | Freq: Four times a day (QID) | ORAL | Status: AC
  Administered 2018-01-08 – 2018-01-09 (×4): 500 mg via ORAL
  Filled 2018-01-08 (×4): qty 1

## 2018-01-08 MED ORDER — BISACODYL 10 MG RE SUPP: 10.0000 mg | Freq: Every day | RECTAL | Status: DC | PRN

## 2018-01-08 MED ORDER — PHENOL 1.4 % MT LIQD
1.0000 | OROMUCOSAL | Status: DC | PRN
  Filled 2018-01-08: qty 177

## 2018-01-08 MED ORDER — HYDROCODONE-ACETAMINOPHEN 5-325 MG PO TABS: 1.0000 | ORAL_TABLET | ORAL | Status: DC | PRN

## 2018-01-08 MED ORDER — MORPHINE SULFATE (PF) 2 MG/ML IV SOLN
0.5000 mg | INTRAVENOUS | Status: DC | PRN
  Administered 2018-01-08: 0.5 mg via INTRAVENOUS
  Filled 2018-01-08: qty 1

## 2018-01-08 MED ORDER — PROPOFOL 10 MG/ML IV BOLUS
INTRAVENOUS | Status: AC
  Filled 2018-01-08: qty 20

## 2018-01-08 MED ORDER — CHLORHEXIDINE GLUCONATE 4 % EX LIQD: 60.0000 mL | Freq: Once | CUTANEOUS | Status: DC

## 2018-01-08 MED ORDER — ONDANSETRON HCL 4 MG/2ML IJ SOLN
INTRAMUSCULAR | Status: AC
  Filled 2018-01-08: qty 2

## 2018-01-08 MED ORDER — METOCLOPRAMIDE HCL 5 MG/ML IJ SOLN: 5.0000 mg | Freq: Three times a day (TID) | INTRAMUSCULAR | Status: DC | PRN

## 2018-01-08 MED ORDER — DEXAMETHASONE SODIUM PHOSPHATE 10 MG/ML IJ SOLN
8.0000 mg | Freq: Once | INTRAMUSCULAR | Status: AC
  Administered 2018-01-08: 8 mg via INTRAVENOUS

## 2018-01-08 MED ORDER — PROPOFOL 10 MG/ML IV BOLUS
INTRAVENOUS | Status: AC
  Filled 2018-01-08: qty 40

## 2018-01-08 MED ORDER — CITALOPRAM HYDROBROMIDE 20 MG PO TABS
40.0000 mg | ORAL_TABLET | Freq: Every day | ORAL | Status: DC
  Administered 2018-01-09: 40 mg via ORAL
  Filled 2018-01-08: qty 2

## 2018-01-08 MED ORDER — FENTANYL CITRATE (PF) 100 MCG/2ML IJ SOLN: 25.0000 ug | INTRAMUSCULAR | Status: DC | PRN

## 2018-01-08 MED ORDER — ASPIRIN EC 325 MG PO TBEC
325.0000 mg | DELAYED_RELEASE_TABLET | Freq: Two times a day (BID) | ORAL | Status: DC
  Administered 2018-01-09: 325 mg via ORAL
  Filled 2018-01-08: qty 1

## 2018-01-08 MED ORDER — ACETAMINOPHEN 10 MG/ML IV SOLN: 1000.0000 mg | Freq: Once | INTRAVENOUS | Status: DC | PRN

## 2018-01-08 MED ORDER — HYDROCODONE-ACETAMINOPHEN 7.5-325 MG PO TABS: 1.0000 | ORAL_TABLET | ORAL | Status: DC | PRN

## 2018-01-08 MED ORDER — ACETAMINOPHEN 10 MG/ML IV SOLN
1000.0000 mg | Freq: Four times a day (QID) | INTRAVENOUS | Status: DC
  Administered 2018-01-08: 1000 mg via INTRAVENOUS
  Filled 2018-01-08: qty 100

## 2018-01-08 MED ORDER — PROPOFOL 10 MG/ML IV BOLUS
INTRAVENOUS | Status: DC | PRN
  Administered 2018-01-08: 20 mg via INTRAVENOUS

## 2018-01-08 MED ORDER — ACETAMINOPHEN 160 MG/5ML PO SOLN: 1000.0000 mg | Freq: Once | ORAL | Status: DC | PRN

## 2018-01-08 MED ORDER — ONDANSETRON HCL 4 MG/2ML IJ SOLN
INTRAMUSCULAR | Status: DC | PRN
  Administered 2018-01-08: 4 mg via INTRAVENOUS

## 2018-01-08 MED ORDER — DIPHENHYDRAMINE HCL 12.5 MG/5ML PO ELIX: 12.5000 mg | ORAL_SOLUTION | ORAL | Status: DC | PRN

## 2018-01-08 MED ORDER — DOCUSATE SODIUM 100 MG PO CAPS
100.0000 mg | ORAL_CAPSULE | Freq: Two times a day (BID) | ORAL | Status: DC
  Administered 2018-01-08 – 2018-01-09 (×2): 100 mg via ORAL
  Filled 2018-01-08 (×2): qty 1

## 2018-01-08 MED ORDER — ONDANSETRON HCL 4 MG/2ML IJ SOLN
4.0000 mg | Freq: Four times a day (QID) | INTRAMUSCULAR | Status: DC | PRN
  Administered 2018-01-08: 4 mg via INTRAVENOUS
  Filled 2018-01-08: qty 2

## 2018-01-08 MED ORDER — METHOCARBAMOL 500 MG IVPB - SIMPLE MED
500.0000 mg | Freq: Four times a day (QID) | INTRAVENOUS | Status: DC | PRN
  Administered 2018-01-08 (×2): 500 mg via INTRAVENOUS
  Filled 2018-01-08: qty 50
  Filled 2018-01-08: qty 500

## 2018-01-08 MED ORDER — FENTANYL CITRATE (PF) 100 MCG/2ML IJ SOLN
INTRAMUSCULAR | Status: AC
  Filled 2018-01-08: qty 2

## 2018-01-08 MED ORDER — BUPIVACAINE IN DEXTROSE 0.75-8.25 % IT SOLN
INTRATHECAL | Status: DC | PRN
  Administered 2018-01-08: 1.8 mL via INTRATHECAL

## 2018-01-08 MED ORDER — FLEET ENEMA 7-19 GM/118ML RE ENEM: 1.0000 | ENEMA | Freq: Once | RECTAL | Status: DC | PRN

## 2018-01-08 MED ORDER — OXYCODONE HCL 5 MG/5ML PO SOLN
5.0000 mg | Freq: Once | ORAL | Status: DC | PRN
  Filled 2018-01-08: qty 5

## 2018-01-08 MED ORDER — ACETAMINOPHEN 500 MG PO TABS: 1000.0000 mg | ORAL_TABLET | Freq: Once | ORAL | Status: DC | PRN

## 2018-01-08 MED ORDER — POLYETHYLENE GLYCOL 3350 17 G PO PACK: 17.0000 g | PACK | Freq: Every day | ORAL | Status: DC | PRN

## 2018-01-08 MED ORDER — TRANEXAMIC ACID-NACL 1000-0.7 MG/100ML-% IV SOLN
1000.0000 mg | INTRAVENOUS | Status: AC
  Administered 2018-01-08: 1000 mg via INTRAVENOUS
  Filled 2018-01-08: qty 100

## 2018-01-08 MED ORDER — METHOCARBAMOL 500 MG IVPB - SIMPLE MED
INTRAVENOUS | Status: AC
  Administered 2018-01-08: 500 mg via INTRAVENOUS
  Filled 2018-01-08: qty 50

## 2018-01-08 MED ORDER — OXYCODONE HCL 5 MG PO TABS: 5.0000 mg | ORAL_TABLET | Freq: Once | ORAL | Status: DC | PRN

## 2018-01-08 MED ORDER — METHOCARBAMOL 500 MG PO TABS: 500.0000 mg | ORAL_TABLET | Freq: Four times a day (QID) | ORAL | Status: DC | PRN

## 2018-01-08 MED ORDER — DEXAMETHASONE SODIUM PHOSPHATE 10 MG/ML IJ SOLN
INTRAMUSCULAR | Status: AC
  Filled 2018-01-08: qty 1

## 2018-01-08 MED ORDER — CEFAZOLIN SODIUM-DEXTROSE 2-4 GM/100ML-% IV SOLN
2.0000 g | Freq: Four times a day (QID) | INTRAVENOUS | Status: AC
  Administered 2018-01-08 (×2): 2 g via INTRAVENOUS
  Filled 2018-01-08 (×2): qty 100

## 2018-01-08 SURGICAL SUPPLY — 42 items
BAG DECANTER FOR FLEXI CONT (MISCELLANEOUS) ×2 IMPLANT
BAG ZIPLOCK 12X15 (MISCELLANEOUS) IMPLANT
BLADE SAG 18X100X1.27 (BLADE) ×2 IMPLANT
BLADE SURG SZ10 CARB STEEL (BLADE) ×4 IMPLANT
CLSR STERI-STRIP ANTIMIC 1/2X4 (GAUZE/BANDAGES/DRESSINGS) ×2 IMPLANT
COVER PERINEAL POST (MISCELLANEOUS) ×2 IMPLANT
COVER SURGICAL LIGHT HANDLE (MISCELLANEOUS) ×2 IMPLANT
COVER WAND RF STERILE (DRAPES) IMPLANT
CUP ACETBLR 52 OD PINNACLE (Hips) ×2 IMPLANT
DECANTER SPIKE VIAL GLASS SM (MISCELLANEOUS) ×2 IMPLANT
DRAPE STERI IOBAN 125X83 (DRAPES) ×2 IMPLANT
DRAPE U-SHAPE 47X51 STRL (DRAPES) ×4 IMPLANT
DRSG ADAPTIC 3X8 NADH LF (GAUZE/BANDAGES/DRESSINGS) ×2 IMPLANT
DRSG MEPILEX BORDER 4X4 (GAUZE/BANDAGES/DRESSINGS) ×2 IMPLANT
DRSG MEPILEX BORDER 4X8 (GAUZE/BANDAGES/DRESSINGS) ×2 IMPLANT
DURAPREP 26ML APPLICATOR (WOUND CARE) ×2 IMPLANT
ELECT REM PT RETURN 15FT ADLT (MISCELLANEOUS) ×2 IMPLANT
EVACUATOR 1/8 PVC DRAIN (DRAIN) ×2 IMPLANT
GLOVE BIO SURGEON STRL SZ7 (GLOVE) ×2 IMPLANT
GLOVE BIO SURGEON STRL SZ8 (GLOVE) ×2 IMPLANT
GLOVE BIOGEL PI IND STRL 7.0 (GLOVE) ×1 IMPLANT
GLOVE BIOGEL PI IND STRL 8 (GLOVE) ×1 IMPLANT
GLOVE BIOGEL PI INDICATOR 7.0 (GLOVE) ×1
GLOVE BIOGEL PI INDICATOR 8 (GLOVE) ×1
GOWN STRL REUS W/TWL LRG LVL3 (GOWN DISPOSABLE) ×2 IMPLANT
GOWN STRL REUS W/TWL XL LVL3 (GOWN DISPOSABLE) ×2 IMPLANT
HEAD FEMORAL 32 CERAMIC (Hips) ×2 IMPLANT
HOLDER FOLEY CATH W/STRAP (MISCELLANEOUS) ×2 IMPLANT
LINER MARATHON NEUT +4X52X32 (Hips) ×2 IMPLANT
MANIFOLD NEPTUNE II (INSTRUMENTS) ×2 IMPLANT
PACK ANTERIOR HIP CUSTOM (KITS) ×2 IMPLANT
STEM FEMORAL SZ 5MM STD ACTIS (Stem) ×2 IMPLANT
STRIP CLOSURE SKIN 1/2X4 (GAUZE/BANDAGES/DRESSINGS) ×2 IMPLANT
SUT ETHIBOND NAB CT1 #1 30IN (SUTURE) ×2 IMPLANT
SUT MNCRL AB 4-0 PS2 18 (SUTURE) ×2 IMPLANT
SUT STRATAFIX 0 PDS 27 VIOLET (SUTURE) ×2
SUT VIC AB 2-0 CT1 27 (SUTURE) ×2
SUT VIC AB 2-0 CT1 TAPERPNT 27 (SUTURE) ×2 IMPLANT
SUTURE STRATFX 0 PDS 27 VIOLET (SUTURE) ×1 IMPLANT
SYR 50ML LL SCALE MARK (SYRINGE) IMPLANT
TRAY FOLEY MTR SLVR 16FR STAT (SET/KITS/TRAYS/PACK) ×2 IMPLANT
YANKAUER SUCT BULB TIP 10FT TU (MISCELLANEOUS) ×2 IMPLANT

## 2018-01-08 NOTE — Discharge Instructions (Signed)
°Dr. Frank Aluisio °Total Joint Specialist °[facility] °[address] °[place], [place] [address] °[phone] ° °ANTERIOR APPROACH TOTAL HIP REPLACEMENT POSTOPERATIVE DIRECTIONS ° ° °Hip Rehabilitation, Guidelines Following Surgery  °The results of a hip operation are greatly improved after range of motion and muscle strengthening exercises. Follow all safety measures which are given to protect your hip. If any of these exercises cause increased pain or swelling in your joint, decrease the amount until you are comfortable again. Then slowly increase the exercises. Call your caregiver if you have problems or questions.  ° °HOME CARE INSTRUCTIONS  °• Remove items at home which could result in a fall. This includes throw rugs or furniture in walking pathways.  °· ICE to the affected hip every three hours for 30 minutes at a time and then as needed for pain and swelling.  Continue to use ice on the hip for pain and swelling from surgery. You may notice swelling that will progress down to the foot and ankle.  This is normal after surgery.  Elevate the leg when you are not up walking on it.   °· Continue to use the breathing machine which will help keep your temperature down.  It is common for your temperature to cycle up and down following surgery, especially at night when you are not up moving around and exerting yourself.  The breathing machine keeps your lungs expanded and your temperature down. ° °DIET °You may resume your previous home diet once your are discharged from the hospital. ° °DRESSING / WOUND CARE / SHOWERING °You may shower 3 days after surgery, but keep the wounds dry during showering.  You may use an occlusive plastic wrap (Press'n Seal for example), NO SOAKING/SUBMERGING IN THE BATHTUB.  If the bandage gets wet, change with a clean dry gauze.  If the incision gets wet, pat the wound dry with a clean towel. °You may start showering once you are discharged home but do not submerge the  incision under water. Just pat the incision dry and apply a dry gauze dressing on daily. °Change the surgical dressing daily and reapply a dry dressing each time. ° °ACTIVITY °Walk with your walker as instructed. °Use walker as long as suggested by your caregivers. °Avoid periods of inactivity such as sitting longer than an hour when not asleep. This helps prevent blood clots.  °You may resume a sexual relationship in one month or when given the OK by your doctor.  °You may return to work once you are cleared by your doctor.  °Do not drive a car for 6 weeks or until released by you surgeon.  °Do not drive while taking narcotics. ° °WEIGHT BEARING °Weight bearing as tolerated with assist device (walker, cane, etc) as directed, use it as long as suggested by your surgeon or therapist, typically at least 4-6 weeks. ° °POSTOPERATIVE CONSTIPATION PROTOCOL °Constipation - defined medically as fewer than three stools per week and severe constipation as less than one stool per week. ° °One of the most common issues patients have following surgery is constipation.  Even if you have a regular bowel pattern at home, your normal regimen is likely to be disrupted due to multiple reasons following surgery.  Combination of anesthesia, postoperative narcotics, change in appetite and fluid intake all can affect your bowels.  In order to avoid complications following surgery, here are some recommendations in order to help you during your recovery period. ° °Colace (docusate) - Pick up an over-the-counter form   of Colace or another stool softener and take twice a day as long as you are requiring postoperative pain medications.  Take with a full glass of water daily.  If you experience loose stools or diarrhea, hold the colace until you stool forms back up.  If your symptoms do not get better within 1 week or if they get worse, check with your doctor. ° °Dulcolax (bisacodyl) - Pick up over-the-counter and take as directed by the product  packaging as needed to assist with the movement of your bowels.  Take with a full glass of water.  Use this product as needed if not relieved by Colace only.  ° °MiraLax (polyethylene glycol) - Pick up over-the-counter to have on hand.  MiraLax is a solution that will increase the amount of water in your bowels to assist with bowel movements.  Take as directed and can mix with a glass of water, juice, soda, coffee, or tea.  Take if you go more than two days without a movement. °Do not use MiraLax more than once per day. Call your doctor if you are still constipated or irregular after using this medication for 7 days in a row. ° °If you continue to have problems with postoperative constipation, please contact the office for further assistance and recommendations.  If you experience "the worst abdominal pain ever" or develop nausea or vomiting, please contact the office immediatly for further recommendations for treatment. ° °ITCHING ° If you experience itching with your medications, try taking only a single pain pill, or even half a pain pill at a time.  You can also use Benadryl over the counter for itching or also to help with sleep.  ° °TED HOSE STOCKINGS °Wear the elastic stockings on both legs for three weeks following surgery during the day but you may remove then at night for sleeping. ° °MEDICATIONS °See your medication summary on the “After Visit Summary” that the nursing staff will review with you prior to discharge.  You may have some home medications which will be placed on hold until you complete the course of blood thinner medication.  It is important for you to complete the blood thinner medication as prescribed by your surgeon.  Continue your approved medications as instructed at time of discharge. ° °PRECAUTIONS °If you experience chest pain or shortness of breath - call 911 immediately for transfer to the hospital emergency department.  °If you develop a fever greater that 101 F, purulent drainage  from wound, increased redness or drainage from wound, foul odor from the wound/dressing, or calf pain - CONTACT YOUR SURGEON.   °                                                °FOLLOW-UP APPOINTMENTS °Make sure you keep all of your appointments after your operation with your surgeon and caregivers. You should call the office at the above phone number and make an appointment for approximately two weeks after the date of your surgery or on the date instructed by your surgeon outlined in the "After Visit Summary". ° °RANGE OF MOTION AND STRENGTHENING EXERCISES  °These exercises are designed to help you keep full movement of your hip joint. Follow your caregiver's or physical therapist's instructions. Perform all exercises about fifteen times, three times per day or as directed. Exercise both hips, even if you have   had only one joint replacement. These exercises can be done on a training (exercise) mat, on the floor, on a table or on a bed. Use whatever works the best and is most comfortable for you. Use music or television while you are exercising so that the exercises are a pleasant break in your day. This will make your life better with the exercises acting as a break in routine you can look forward to.  °• Lying on your back, slowly slide your foot toward your buttocks, raising your knee up off the floor. Then slowly slide your foot back down until your leg is straight again.  °• Lying on your back spread your legs as far apart as you can without causing discomfort.  °• Lying on your side, raise your upper leg and foot straight up from the floor as far as is comfortable. Slowly lower the leg and repeat.  °• Lying on your back, tighten up the muscle in the front of your thigh (quadriceps muscles). You can do this by keeping your leg straight and trying to raise your heel off the floor. This helps strengthen the largest muscle supporting your knee.  °• Lying on your back, tighten up the muscles of your buttocks both  with the legs straight and with the knee bent at a comfortable angle while keeping your heel on the floor.  ° °IF YOU ARE TRANSFERRED TO A SKILLED REHAB FACILITY °If the patient is transferred to a skilled rehab facility following release from the hospital, a list of the current medications will be sent to the facility for the patient to continue.  When discharged from the skilled rehab facility, please have the facility set up the patient's Home Health Physical Therapy prior to being released. Also, the skilled facility will be responsible for providing the patient with their medications at time of release from the facility to include their pain medication, the muscle relaxants, and their blood thinner medication. If the patient is still at the rehab facility at time of the two week follow up appointment, the skilled rehab facility will also need to assist the patient in arranging follow up appointment in our office and any transportation needs. ° °MAKE SURE YOU:  °• Understand these instructions.  °• Get help right away if you are not doing well or get worse.  ° ° °Pick up stool softner and laxative for home use following surgery while on pain medications. °Do not submerge incision under water. °Please use good hand washing techniques while changing dressing each day. °May shower starting three days after surgery. °Please use a clean towel to pat the incision dry following showers. °Continue to use ice for pain and swelling after surgery. °Do not use any lotions or creams on the incision until instructed by your surgeon. ° °

## 2018-01-08 NOTE — Interval H&P Note (Signed)
History and Physical Interval Note:  01/08/2018 7:33 AM  Brendan Bell  has presented today for surgery, with the diagnosis of right hip osteoarthritis  The various methods of treatment have been discussed with the patient and family. After consideration of risks, benefits and other options for treatment, the patient has consented to  Procedure(s) with comments: [place] (Right) - 140min as a surgical intervention .  The patient's history has been reviewed, patient examined, no change in status, stable for surgery.  I have reviewed the patient's chart and labs.  Questions were answered to the patient's satisfaction.     Pilar Plate [name]

## 2018-01-08 NOTE — Anesthesia Procedure Notes (Signed)
Date/Time: 01/08/2018 9:46 AM Performed by: Glory Buff, CRNA Oxygen Delivery Method: Simple face mask

## 2018-01-08 NOTE — Anesthesia Procedure Notes (Signed)
Spinal  Patient location during procedure: OR Start time: 01/08/2018 9:50 AM End time: 01/08/2018 9:53 AM Staffing Anesthesiologist: Oleta Mouse, MD Resident/CRNA: Glory Buff, CRNA Performed: resident/CRNA  Preanesthetic Checklist Completed: patient identified, site marked, surgical consent, pre-op evaluation, timeout performed, IV checked, risks and benefits discussed and monitors and equipment checked Spinal Block Patient position: sitting Prep: DuraPrep Patient monitoring: heart rate, continuous pulse ox and blood pressure Approach: midline Location: L3-4 Injection technique: single-shot Needle Needle type: Pencan  Needle gauge: 24 G Needle length: 9 cm Needle insertion depth: 6 cm Assessment Sensory level: T6 Additional Notes Kit expiration date checked and verified.  Sterile prep and drape, Skin local with 1% lidocaine, stick x 1, - heme, - paraesthesia, + CSF pre and post injection, patient tolerated well.

## 2018-01-08 NOTE — Plan of Care (Signed)

## 2018-01-08 NOTE — Evaluation (Signed)
Physical Therapy Evaluation Patient Details Name: Brendan Bell MRN: [id number] DOB: Nov 24, 1950 Today's Date: 01/08/2018   History of Present Illness  67 yo male s/p R DA-THA on 01/08/18. PMH includes OA, HLD, anxiety, depression, multiple surgeries on bilateral knees.   Clinical Impression   Pt presents with R hip pain, increased time and effort to perform mobility tasks, suspected post-surgical R hip weakness, and decreased activity tolerance. Pt to benefit from acute PT to address deficits. Pt ambulated 49 ft with RW with min guard assist, verbal cuing provided throughout. Pt educated on quad sets (5-10/hour), ankle pumps (20/hour), and heel slides (5-10/hour) to perform this afternoon/evening to lessen stiffness and increase circulation, to pt's tolerance and limited by pain. PT to progress mobility as tolerated, and will continue to follow acutely.      Follow Up Recommendations Follow surgeon's recommendation for DC plan and follow-up therapies;Supervision for mobility/OOB(HEP )    Equipment Recommendations  Rolling walker with 5" wheels(Per ortho bundle case management's note, pt has standard walker. Per pt and wife, they do not have a walker. )    Recommendations for Other Services       Precautions / Restrictions Precautions Precautions: Fall Restrictions Weight Bearing Restrictions: No Other Position/Activity Restrictions: WBAT       Mobility  Bed Mobility Overal bed mobility: Needs Assistance Bed Mobility: Supine to Sit     Supine to sit: Min guard;HOB elevated     General bed mobility comments: min guard for safety. Increased time/effort   Transfers Overall transfer level: Needs assistance Equipment used: Rolling walker (2 wheeled) Transfers: Sit to/from Stand Sit to Stand: Min guard;From elevated surface         General transfer comment: Min guard for safety. Verbal cuing for hand placement. Pt attempting to stand without RW, encouraged to stop and  wait for RW.   Ambulation/Gait Ambulation/Gait assistance: Min guard;+2 safety/equipment Gait Distance (Feet): 70 Feet Assistive device: Rolling walker (2 wheeled) Gait Pattern/deviations: Step-to pattern;Step-through pattern;Decreased stride length Gait velocity: decr    General Gait Details: Min guard for safety. Verbal cuing for step to gait, pt quickly progressing to step-through gait. Verbal cuing provided for placement in RW.   Stairs            Wheelchair Mobility    Modified Rankin (Stroke Patients Only)       Balance Overall balance assessment: Mild deficits observed, not formally tested                                           Pertinent Vitals/Pain Pain Assessment: 0-10 Pain Score: 2  Pain Location: R hip  Pain Descriptors / Indicators: Sore Pain Intervention(s): Limited activity within patient's tolerance;Repositioned;Ice applied;Monitored during session    [place] expects to be discharged to:: Private residence Living Arrangements: Spouse/significant other Available Help at Discharge: Family;Available PRN/intermittently Type of Home: House Home Access: Stairs to enter Entrance Stairs-Rails: Left Entrance Stairs-Number of Steps: 3 Home Layout: Two level;Able to live on main level with bedroom/bathroom Home Equipment: Kasandra Knudsen - single point;Bedside commode;Hand held shower head;Shower seat      Prior Function Level of Independence: Independent               Hand Dominance   Dominant Hand: Right    Extremity/Trunk Assessment   Upper Extremity Assessment Upper Extremity Assessment: Overall WFL for tasks assessed  Lower Extremity Assessment Lower Extremity Assessment: Overall WFL for tasks assessed;RLE deficits/detail RLE Deficits / Details: suspected post-surgical hip weakness, able to perform ankle pumps, quad sets, SLR during bed mobility  RLE Sensation: WNL    Cervical / Trunk  Assessment Cervical / Trunk Assessment: Normal  Communication   Communication: No difficulties  Cognition Arousal/Alertness: Awake/alert Behavior During Therapy: WFL for tasks assessed/performed Overall Cognitive Status: Within Functional Limits for tasks assessed                                        General Comments      Exercises     Assessment/Plan    PT Assessment Patient needs continued PT services  PT Problem List Decreased strength;Pain;Decreased activity tolerance;Decreased knowledge of use of DME;Decreased balance;Decreased mobility       PT Treatment Interventions DME instruction;Therapeutic activities;Gait training;Therapeutic exercise;Patient/family education;Stair training;Balance training;Functional mobility training    PT Goals (Current goals can be found in the Care Plan section)  Acute Rehab PT Goals PT Goal Formulation: With patient Time For Goal Achievement: 01/15/18 Potential to Achieve Goals: Good    Frequency 7X/week   Barriers to discharge        Co-evaluation               AM-PAC PT "6 Clicks" Mobility  Outcome Measure Help needed turning from your back to your side while in a flat bed without using bedrails?: A Little Help needed moving from lying on your back to sitting on the side of a flat bed without using bedrails?: A Little Help needed moving to and from a bed to a chair (including a wheelchair)?: A Little Help needed standing up from a chair using your arms (e.g., wheelchair or bedside chair)?: A Little Help needed to walk in hospital room?: A Little Help needed climbing 3-5 steps with a railing? : A Little 6 Click Score: 18    End of Session Equipment Utilized During Treatment: Gait belt Activity Tolerance: Patient tolerated treatment well Patient left: in chair;with chair alarm set;with call bell/phone within reach;with family/visitor present;with SCD's reapplied Nurse Communication: Mobility status PT  Visit Diagnosis: Other abnormalities of gait and mobility (R26.89);Difficulty in walking, not elsewhere classified (R26.2)    Time: 2706-2376 PT Time Calculation (min) (ACUTE ONLY): 23 min   Charges:   PT Evaluation $PT Eval Low Complexity: 1 Low          Israa Caban Conception Chancy, PT Acute Rehabilitation Services Pager [phone]  Office [phone]   Lukis Bunt D Elonda Husky 01/08/2018, 5:19 PM

## 2018-01-08 NOTE — Anesthesia Preprocedure Evaluation (Addendum)
Anesthesia Evaluation  Patient identified by MRN, date of birth, ID band Patient awake    Reviewed: Allergy & Precautions, NPO status , Patient's Chart, lab work & pertinent test results  History of Anesthesia Complications Negative for: history of anesthetic complications  Airway Mallampati: II  TM Distance: >3 FB Neck ROM: Full    Dental  (+) Teeth Intact   Pulmonary neg pulmonary ROS,    breath sounds clear to auscultation       Cardiovascular negative cardio ROS   Rhythm:Regular     Neuro/Psych PSYCHIATRIC DISORDERS Anxiety Depression  Neuromuscular disease    GI/Hepatic negative GI ROS, Neg liver ROS,   Endo/Other  negative endocrine ROS  Renal/GU negative Renal ROS     Musculoskeletal  (+) Arthritis ,   Abdominal   Peds  Hematology negative hematology ROS (+)   Anesthesia Other Findings   Reproductive/Obstetrics                            Anesthesia Physical Anesthesia Plan  ASA: I  Anesthesia Plan: MAC and Spinal   Post-op Pain Management:    Induction: Intravenous  PONV Risk Score and Plan: 1 and Propofol infusion and Treatment may vary due to age or medical condition  Airway Management Planned: Nasal Cannula  Additional Equipment: None  Intra-op Plan:   Post-operative Plan:   Informed Consent: I have reviewed the patients History and Physical, chart, labs and discussed the procedure including the risks, benefits and alternatives for the proposed anesthesia with the patient or authorized representative who has indicated his/her understanding and acceptance.   Dental advisory given  Plan Discussed with: CRNA and Surgeon  Anesthesia Plan Comments:         Anesthesia Quick Evaluation

## 2018-01-08 NOTE — Transfer of Care (Signed)
Immediate Anesthesia Transfer of Care Note  Patient: Brendan Bell  Procedure(s) Performed: RIGHT TOTAL HIP ARTHROPLASTY ANTERIOR APPROACH (Right Hip)  Patient Location: PACU  Anesthesia Type:MAC and Spinal  Level of Consciousness: awake, alert  and oriented  Airway & Oxygen Therapy: Patient Spontanous Breathing and Patient connected to face mask oxygen  Post-op Assessment: Report given to RN and Post -op Vital signs reviewed and stable  Post vital signs: Reviewed and stable  Last Vitals:  Vitals Value Taken Time  BP    Temp    Pulse    Resp    SpO2      Last Pain:  Vitals:   01/08/18 0746  PainSc: 4          Complications: No apparent anesthesia complications

## 2018-01-08 NOTE — Op Note (Signed)
OPERATIVE REPORT- TOTAL HIP ARTHROPLASTY   PREOPERATIVE DIAGNOSIS: Osteoarthritis of the Right hip.   POSTOPERATIVE DIAGNOSIS: Osteoarthritis of the Right  hip.   PROCEDURE: Right total hip arthroplasty, anterior approach.   SURGEON: Gaynelle Arabian, MD   ASSISTANT: Griffith Citron, PA-C  ANESTHESIA:  Spinal  ESTIMATED BLOOD LOSS:-500 mL    DRAINS: Hemovac x1.   COMPLICATIONS: None   CONDITION: PACU - hemodynamically stable.   BRIEF CLINICAL NOTE: Brendan Bell is a 67 y.o. male who has advanced end-  stage arthritis of their Right  hip with progressively worsening pain and  dysfunction.The patient has failed nonoperative management and presents for  total hip arthroplasty.   PROCEDURE IN DETAIL: After successful administration of spinal  anesthetic, the traction boots for the [facility] bed were placed on both  feet and the patient was placed onto the [facility] bed, boots placed into the leg  holders. The Right hip was then isolated from the perineum with plastic  drapes and prepped and draped in the usual sterile fashion. ASIS and  greater trochanter were marked and a oblique incision was made, starting  at about 1 cm lateral and 2 cm distal to the ASIS and coursing towards  the anterior cortex of the femur. The skin was cut with a 10 blade  through subcutaneous tissue to the level of the fascia overlying the  tensor fascia lata muscle. The fascia was then incised in line with the  incision at the junction of the anterior third and posterior 2/3rd. The  muscle was teased off the fascia and then the interval between the TFL  and the rectus was developed. The Hohmann retractor was then placed at  the top of the femoral neck over the capsule. The vessels overlying the  capsule were cauterized and the fat on top of the capsule was removed.  A Hohmann retractor was then placed anterior underneath the rectus  femoris to give exposure to the entire anterior capsule. A T-shaped   capsulotomy was performed. The edges were tagged and the femoral head  was identified.       Osteophytes are removed off the superior acetabulum.  The femoral neck was then cut in situ with an oscillating saw. Traction  was then applied to the left lower extremity utilizing the [facility]  traction. The femoral head was then removed. Retractors were placed  around the acetabulum and then circumferential removal of the labrum was  performed. Osteophytes were also removed. Reaming starts at 47 mm to  medialize and  Increased in 2 mm increments to 51 mm. We reamed in  approximately 40 degrees of abduction, 20 degrees anteversion. A 52 mm  pinnacle acetabular shell was then impacted in anatomic position under  fluoroscopic guidance with excellent purchase. We did not need to place  any additional dome screws. A 32 mm neutral + 4 marathon liner was then  placed into the acetabular shell.       The femoral lift was then placed along the lateral aspect of the femur  just distal to the vastus ridge. The leg was  externally rotated and capsule  was stripped off the inferior aspect of the femoral neck down to the  level of the lesser trochanter, this was done with electrocautery. The femur was lifted after this was performed. The  leg was then placed in an extended and adducted position essentially delivering the femur. We also removed the capsule superiorly and the piriformis from the piriformis  fossa to gain excellent exposure of the  proximal femur. Rongeur was used to remove some cancellous bone to get  into the lateral portion of the proximal femur for placement of the  initial starter reamer. The starter broaches was placed  the starter broach  and was shown to go down the center of the canal. Broaching  with the Actis system was then performed starting at size 0  coursing  Up to size 5. A size 5 had excellent torsional and rotational  and axial stability. The trial standard offset neck was then  placed  with a 32 + 1 trial head. The hip was then reduced. We confirmed that  the stem was in the canal both on AP and lateral x-rays. It also has excellent sizing. The hip was reduced with outstanding stability through full extension and full external rotation.. AP pelvis was taken and the leg lengths were measured and found to be equal. Hip was then dislocated again and the femoral head and neck removed. The  femoral broach was removed. Size 5 Actis stem with a standard offset  neck was then impacted into the femur following native anteversion. Has  excellent purchase in the canal. Excellent torsional and rotational and  axial stability. It is confirmed to be in the canal on AP and lateral  fluoroscopic views. The 32 + 1 ceramic head was placed and the hip  reduced with outstanding stability. Again AP pelvis was taken and it  confirmed that the leg lengths were equal. The wound was then copiously  irrigated with saline solution and the capsule reattached and repaired  with Ethibond suture. 30 ml of .25% Bupivicaine was  injected into the capsule and into the edge of the tensor fascia lata as well as subcutaneous tissue. The fascia overlying the tensor fascia lata was then closed with a running #1 V-Loc. Subcu was closed with interrupted 2-0 Vicryl and subcuticular running 4-0 Monocryl. Incision was cleaned  and dried. Steri-Strips and a bulky sterile dressing applied. Hemovac  drain was hooked to suction and then the patient was awakened and transported to  recovery in stable condition.        Please note that a surgical assistant was a medical necessity for this procedure to perform it in a safe and expeditious manner. Assistant was necessary to provide appropriate retraction of vital neurovascular structures and to prevent femoral fracture and allow for anatomic placement of the prosthesis.  Gaynelle Arabian, M.D.

## 2018-01-08 NOTE — Care Plan (Signed)
Ortho Bundle Case Management Note  Patient Details  Name: MAREK NGHIEM MRN: [id number] Date of Birth: 1950/09/15  R THA scheduled on 01-08-18 DCP:  Home with spouse.  Lives in a 2 story home with 3 ste.  MBR is on the 1st floor. DME:  No needs.  Has a standard walker and 3-in-1. PT:  HEP                   DME Arranged:  N/A DME Agency:  NA  HH Arranged:  NA HH Agency:  NA  Additional Comments: Please contact me with any questions of if this plan should need to change.  Marianne Sofia, RN,CCM [facility]  [phone] 01/08/2018, 4:14 PM

## 2018-01-09 ENCOUNTER — Encounter (HOSPITAL_COMMUNITY): Payer: Self-pay | Admitting: Orthopedic Surgery

## 2018-01-09 LAB — BASIC METABOLIC PANEL
Anion gap: 9 (ref 5–15)
BUN: 16 mg/dL (ref 8–23)
CHLORIDE: 108 mmol/L (ref 98–111)
CO2: 21 mmol/L — ABNORMAL LOW (ref 22–32)
Calcium: 8.5 mg/dL — ABNORMAL LOW (ref 8.9–10.3)
Creatinine, Ser: 1.05 mg/dL (ref 0.61–1.24)
GFR calc Af Amer: >60 mL/min (ref >60)
GFR calc non Af Amer: >60 mL/min (ref >60)
Glucose, Bld: 123 mg/dL — ABNORMAL HIGH (ref 70–99)
Potassium: 4.1 mmol/L (ref 3.5–5.1)
Sodium: 138 mmol/L (ref 135–145)

## 2018-01-09 LAB — CBC
HCT: 41.6 % (ref 39.0–52.0)
Hemoglobin: 13.8 g/dL (ref 13.0–17.0)
MCH: 29.8 pg (ref 26.0–34.0)
MCHC: 33.2 g/dL (ref 30.0–36.0)
MCV: 89.8 fL (ref 80.0–100.0)
NRBC: 0 % (ref 0.0–0.2)
Platelets: 187 10*3/uL (ref 150–400)
RBC: 4.63 MIL/uL (ref 4.22–5.81)
RDW: 12.6 % (ref 11.5–15.5)
WBC: 13.5 10*3/uL — AB (ref 4.0–10.5)

## 2018-01-09 MED ORDER — ASPIRIN 325 MG PO TBEC: 325.0000 mg | DELAYED_RELEASE_TABLET | Freq: Two times a day (BID) | ORAL | 0 refills | Status: AC

## 2018-01-09 MED ORDER — METHOCARBAMOL 500 MG PO TABS: 500.0000 mg | ORAL_TABLET | Freq: Four times a day (QID) | ORAL | 0 refills | Status: DC | PRN

## 2018-01-09 MED ORDER — HYDROCODONE-ACETAMINOPHEN 5-325 MG PO TABS: 1.0000 | ORAL_TABLET | Freq: Four times a day (QID) | ORAL | 0 refills | Status: DC | PRN

## 2018-01-09 NOTE — Progress Notes (Signed)
Physical Therapy Treatment Patient Details Name: Brendan Bell MRN: [id number] DOB: 02-28-1950 Today's Date: 01/09/2018    History of Present Illness 67 yo male s/p R DA-THA on 01/08/18. PMH includes OA, HLD, anxiety, depression, multiple surgeries on bilateral knees.     PT Comments    POD # 1 pm session Pt required a second session to complete TE's following HEP.  Performed all standing TE's  And addressed all mobility questions.  Pt ready for D/C to home.   Follow Up Recommendations  Follow surgeon's recommendation for DC plan and follow-up therapies;Supervision for mobility/OOB     Equipment Recommendations  Rolling walker with 5" wheels    Recommendations for Other Services       Precautions / Restrictions Precautions Precautions: Fall Restrictions Weight Bearing Restrictions: No Other Position/Activity Restrictions: WBAT     Mobility  Bed Mobility               General bed mobility comments: OOB in recliner   Transfers Overall transfer level: Needs assistance Equipment used: Rolling walker (2 wheeled) Transfers: Sit to/from Stand Sit to Stand: Min guard;From elevated surface;Supervision         General transfer comment: one VC safety with turns  Ambulation/Gait Ambulation/Gait assistance: Supervision;Min guard Gait Distance (Feet): 5 Feet Assistive device: Rolling walker (2 wheeled) Gait Pattern/deviations: Step-to pattern;Step-through pattern;Decreased stride length Gait velocity: decr    General Gait Details: one VC safety with turns    Development worker, community Rankin (Stroke Patients Only)       Balance                                            Cognition Arousal/Alertness: Awake/alert Behavior During Therapy: WFL for tasks assessed/performed Overall Cognitive Status: Within Functional Limits for tasks assessed                                        Exercises       General Comments        Pertinent Vitals/Pain Pain Assessment: 0-10 Pain Score: 3  Pain Location: R hip  Pain Descriptors / Indicators: Sore Pain Intervention(s): Monitored during session;Repositioned;Ice applied    Home Living                      Prior Function            PT Goals (current goals can now be found in the care plan section) Progress towards PT goals: Progressing toward goals    Frequency    7X/week      PT Plan Current plan remains appropriate    Co-evaluation              AM-PAC PT "6 Clicks" Mobility   Outcome Measure  Help needed turning from your back to your side while in a flat bed without using bedrails?: A Little Help needed moving from lying on your back to sitting on the side of a flat bed without using bedrails?: A Little Help needed moving to and from a bed to a chair (including a wheelchair)?: A Little Help needed standing up from a chair using your arms (e.g., wheelchair or bedside chair)?: A Little Help needed to walk in hospital room?: A  Little Help needed climbing 3-5 steps with a railing? : A Little 6 Click Score: 18    End of Session Equipment Utilized During Treatment: Gait belt Activity Tolerance: Patient tolerated treatment well Patient left: in chair;with chair alarm set;with call bell/phone within reach;with family/visitor present;with SCD's reapplied Nurse Communication: Mobility status PT Visit Diagnosis: Other abnormalities of gait and mobility (R26.89);Difficulty in walking, not elsewhere classified (R26.2)     Time: 1145-1200 PT Time Calculation (min) (ACUTE ONLY): 15 min  Charges:   $Therapeutic Exercise: 8-22 mins                      Rica Koyanagi  PTA Acute  Rehabilitation Services Pager      [phone] Office      [phone]

## 2018-01-09 NOTE — Plan of Care (Signed)
  Problem: Education: Goal: Knowledge of General Education information will improve Description Including pain rating scale, medication(s)/side effects and non-pharmacologic comfort measures Outcome: Progressing   Problem: Health Behavior/Discharge Planning: Goal: Ability to manage health-related needs will improve Outcome: Progressing   Problem: Clinical Measurements: Goal: Ability to maintain clinical measurements within normal limits will improve Outcome: Progressing Goal: Will remain free from infection Outcome: Progressing Goal: Diagnostic test results will improve Outcome: Progressing Goal: Respiratory complications will improve Outcome: Progressing Goal: Cardiovascular complication will be avoided Outcome: Progressing   Problem: Activity: Goal: Risk for activity intolerance will decrease Outcome: Progressing   Problem: Nutrition: Goal: Adequate nutrition will be maintained Outcome: Progressing   Problem: Coping: Goal: Level of anxiety will decrease Outcome: Progressing   Problem: Elimination: Goal: Will not experience complications related to bowel motility Outcome: Progressing Goal: Will not experience complications related to urinary retention Outcome: Progressing   Problem: Pain Managment: Goal: General experience of comfort will improve Outcome: Progressing   Problem: Safety: Goal: Ability to remain free from injury will improve Outcome: Progressing   Problem: Skin Integrity: Goal: Risk for impaired skin integrity will decrease Outcome: Progressing   Problem: Education: Goal: Knowledge of the prescribed therapeutic regimen will improve Outcome: Progressing Goal: Understanding of discharge needs will improve Outcome: Progressing Goal: Individualized Educational Video(s) Outcome: Progressing   Problem: Activity: Goal: Ability to avoid complications of mobility impairment will improve Outcome: Progressing Goal: Ability to tolerate increased  activity will improve Outcome: Progressing   Problem: Clinical Measurements: Goal: Postoperative complications will be avoided or minimized Outcome: Progressing   Problem: Pain Management: Goal: Pain level will decrease with appropriate interventions Outcome: Progressing   Problem: Skin Integrity: Goal: Will show signs of wound healing Outcome: Progressing   Problem: Spiritual Needs Goal: Ability to function at adequate level Outcome: Progressing

## 2018-01-09 NOTE — Progress Notes (Signed)
Physical Therapy Treatment Patient Details Name: Brendan Bell MRN: [id number] DOB: 08-28-1950 Today's Date: 01/09/2018    History of Present Illness 67 yo male s/p R DA-THA on 01/08/18. PMH includes OA, HLD, anxiety, depression, multiple surgeries on bilateral knees.     PT Comments    POD # 1 am session Spouse present during session.  Assisted with amb a greater distance in hallway, practiced stairs then Performed some TE's following HEP handout.  Instructed on proper tech, freq as well as use of ICE.  Unable to complete all TE's due to increased pain.  Will allow pt a rest period, apply ICE and second session will be needed prior to D/C.    Follow Up Recommendations  Follow surgeon's recommendation for DC plan and follow-up therapies;Supervision for mobility/OOB     Equipment Recommendations  Rolling walker with 5" wheels    Recommendations for Other Services       Precautions / Restrictions Precautions Precautions: Fall Restrictions Weight Bearing Restrictions: No Other Position/Activity Restrictions: WBAT     Mobility  Bed Mobility               General bed mobility comments: OOB in recliner   Transfers Overall transfer level: Needs assistance Equipment used: Rolling walker (2 wheeled) Transfers: Sit to/from Stand Sit to Stand: Min guard;From elevated surface;Supervision         General transfer comment: one VC safety with turns  Ambulation/Gait Ambulation/Gait assistance: Supervision;Min guard Gait Distance (Feet): 155 Feet Assistive device: Rolling walker (2 wheeled) Gait Pattern/deviations: Step-to pattern;Step-through pattern;Decreased stride length Gait velocity: decr    General Gait Details: one VC safety with turns    Stairs Stairs: Yes Stairs assistance: Supervision;Min guard Stair Management: One rail Left;Step to pattern;Forwards Number of Stairs: 3 General stair comments: with spouse present practiced stairs 25% VC's on proper  sequencing   Wheelchair Mobility    Modified Rankin (Stroke Patients Only)       Balance                                            Cognition Arousal/Alertness: Awake/alert Behavior During Therapy: WFL for tasks assessed/performed Overall Cognitive Status: Within Functional Limits for tasks assessed                                        Exercises   Total Hip Replacement TE's 10 reps ankle pumps 10 reps knee presses 10 reps heel slides 10 reps SAQ's 10 reps ABD Followed by ICE     General Comments        Pertinent Vitals/Pain Pain Assessment: 0-10 Pain Score: 3  Pain Location: R hip  Pain Descriptors / Indicators: Sore Pain Intervention(s): Monitored during session;Repositioned;Ice applied    Home Living                      Prior Function            PT Goals (current goals can now be found in the care plan section) Progress towards PT goals: Progressing toward goals    Frequency    7X/week      PT Plan Current plan remains appropriate    Co-evaluation  AM-PAC PT "6 Clicks" Mobility   Outcome Measure  Help needed turning from your back to your side while in a flat bed without using bedrails?: A Little Help needed moving from lying on your back to sitting on the side of a flat bed without using bedrails?: A Little Help needed moving to and from a bed to a chair (including a wheelchair)?: A Little Help needed standing up from a chair using your arms (e.g., wheelchair or bedside chair)?: A Little Help needed to walk in hospital room?: A Little Help needed climbing 3-5 steps with a railing? : A Little 6 Click Score: 18    End of Session Equipment Utilized During Treatment: Gait belt Activity Tolerance: Patient tolerated treatment well Patient left: in chair;with chair alarm set;with call bell/phone within reach;with family/visitor present;with SCD's reapplied Nurse Communication:  Mobility status PT Visit Diagnosis: Other abnormalities of gait and mobility (R26.89);Difficulty in walking, not elsewhere classified (R26.2)     Time: 6468-0321 PT Time Calculation (min) (ACUTE ONLY): 40 min  Charges:  $Gait Training: 8-22 mins $Therapeutic Exercise: 8-22 mins $Therapeutic Activity: 8-22 mins                     Rica Koyanagi  PTA Acute  Rehabilitation Services Pager      [phone] Office      [phone]

## 2018-01-09 NOTE — Progress Notes (Signed)
   Subjective: 1 Day Post-Op Procedure(s) (LRB): RIGHT TOTAL HIP ARTHROPLASTY ANTERIOR APPROACH (Right) Patient reports pain as mild.   Patient seen in rounds by Dr. Wynelle Link. Patient is well, and has had no acute complaints or problems. States he is ready to go home. Denies chest pain or SOB. Foley catheter removed this AM. No issues overnight.  We will continue therapy today.   Objective: Vital signs in last 24 hours: Temp:  [97.6 F (36.4 C)-99.4 F (37.4 C)] 98.4 F (36.9 C) (12/12 0657) Pulse Rate:  [65-97] 81 (12/12 0657) Resp:  [13-28] 16 (12/12 0657) BP: (103-133)/(64-95) 128/84 (12/12 0657) SpO2:  [95 %-100 %] 100 % (12/12 0657)  Intake/Output from previous day:  Intake/Output Summary (Last 24 hours) at 01/09/2018 0753 Last data filed at 01/09/2018 0538 Gross per 24 hour  Intake 4293.33 ml  Output 3380 ml  Net 913.33 ml    Labs: Recent Labs    01/09/18 0411  HGB 13.8   Recent Labs    01/09/18 0411  WBC 13.5*  RBC 4.63  HCT 41.6  PLT 187   Recent Labs    01/09/18 0411  NA 138  K 4.1  CL 108  CO2 21*  BUN 16  CREATININE 1.05  GLUCOSE 123*  CALCIUM 8.5*   Exam: General - Patient is Alert and Oriented Extremity - Neurologically intact Neurovascular intact Sensation intact distally Dorsiflexion/Plantar flexion intact Dressing - dressing C/D/I Motor Function - intact, moving foot and toes well on exam.   Past Medical History:  Diagnosis Date  . Anxiety   . Depression   . Hyperlipidemia   . Osteoarthritis   . Pneumonia 10/04/98   out patient  . Rhinitis, allergic     Assessment/Plan: 1 Day Post-Op Procedure(s) (LRB): RIGHT TOTAL HIP ARTHROPLASTY ANTERIOR APPROACH (Right) Principal Problem:   Arthritis of right hip Active Problems:   OA (osteoarthritis) of hip  Estimated body mass index is 30.15 kg/m as calculated from the following:   Height as of this encounter: 5' 5.5" (1.664 m).   Weight as of this encounter: 83.5 kg. Advance  diet Up with therapy D/C IV fluids  DVT Prophylaxis - Aspirin Weight bearing as tolerated. D/C O2 and pulse ox and try on room air. Hemovac pulled without difficulty, will continue therapy.  Plan is to go Home after hospital stay. Plan for discharge this AM after one session of therapy with HEP. Follow-up in the office in 2 weeks with Dr. Wynelle Link.  Theresa Duty, PA-C Orthopedic Surgery 01/09/2018, 7:53 AM

## 2018-01-09 NOTE — Care Management Note (Signed)
Case Management Note  Patient Details  Name: Brendan Bell MRN: [id number] Date of Birth: 09/18/1950  Subjective/Objective:   Per PT needs RW                 Action/Plan: Contacted [facility] to deliver to the room  Expected Discharge Date:  01/09/18               Expected Discharge Plan:  Home/Self Care  In-House Referral:  NA  Discharge planning Services  CM Consult  Post Acute Care Choice:  Durable Medical Equipment Choice offered to:  Patient  DME Arranged:  N/A DME Agency:  NA  HH Arranged:  NA HH Agency:  NA  Status of Service:  Completed, signed off  If discussed at [place] of Stay Meetings, dates discussed:    Additional Comments:  Guadalupe Maple, RN 01/09/2018, 12:47 PM

## 2018-01-10 ENCOUNTER — Encounter (HOSPITAL_COMMUNITY): Payer: Self-pay | Admitting: Orthopedic Surgery

## 2018-01-10 NOTE — Anesthesia Postprocedure Evaluation (Signed)
Anesthesia Post Note  Patient: Brendan Bell  Procedure(s) Performed: RIGHT TOTAL HIP ARTHROPLASTY ANTERIOR APPROACH (Right Hip)     Patient location during evaluation: PACU Anesthesia Type: MAC and Spinal Level of consciousness: awake and alert Pain management: pain level controlled Vital Signs Assessment: post-procedure vital signs reviewed and stable Respiratory status: spontaneous breathing, nonlabored ventilation, respiratory function stable and patient connected to nasal cannula oxygen Cardiovascular status: stable and blood pressure returned to baseline Postop Assessment: no apparent nausea or vomiting and spinal receding Anesthetic complications: no    Last Vitals:  Vitals:   01/09/18 0657 01/09/18 0913  BP: 128/84 (!) 129/92  Pulse: 81 89  Resp: 16 16  Temp: 36.9 C 36.7 C  SpO2: 100% 100%    Last Pain:  Vitals:   01/09/18 0922  TempSrc:   PainSc: 4                  Durrell Barajas

## 2018-01-13 NOTE — Discharge Summary (Signed)
Physician Discharge Summary   Patient ID: Brendan Bell MRN: [id number] DOB/AGE: 65-23-1952 65 y.o.  Admit date: 01/08/2018 Discharge date: 01/09/2018  Primary Diagnosis: Osteoarthritis, right hip   Admission Diagnoses:  Past Medical History:  Diagnosis Date  . Anxiety   . Depression   . Hyperlipidemia   . Osteoarthritis   . Pneumonia 10/04/98   out patient  . Rhinitis, allergic    Discharge Diagnoses:   Principal Problem:   Arthritis of right hip Active Problems:   OA (osteoarthritis) of hip  Estimated body mass index is 30.15 kg/m as calculated from the following:   Height as of this encounter: 5' 5.5" (1.664 m).   Weight as of this encounter: 83.5 kg.  Procedure:  Procedure(s) (LRB): RIGHT TOTAL HIP ARTHROPLASTY ANTERIOR APPROACH (Right)   Consults: None  HPI: Brendan Bell is a 67 y.o. male who has advanced end-stage arthritis of their Right  hip with progressively worsening pain and dysfunction.The patient has failed nonoperative management and presents for  total hip arthroplasty.   Laboratory Data: Admission on 01/08/2018, Discharged on 01/09/2018  Component Date Value Ref Range Status  . ABO/RH(D) 01/08/2018 A POS   Final  . Antibody Screen 01/08/2018 NEG   Final  . Sample Expiration 01/08/2018    Final                   Value:01/11/2018 Performed at [facility], [place] [address], [place], [place] [address]   . ABO/RH(D) 01/08/2018    Final                   Value:A POS Performed at [facility], [place] [address] [place], [place] [address]   . WBC 01/09/2018 13.5* 4.0 - 10.5 K/uL Final  . RBC 01/09/2018 4.63  4.22 - 5.81 MIL/uL Final  . Hemoglobin 01/09/2018 13.8  13.0 - 17.0 g/dL Final  . HCT 01/09/2018 41.6  39.0 - 52.0 % Final  . MCV 01/09/2018 89.8  80.0 - 100.0 fL Final  . MCH 01/09/2018 29.8  26.0 - 34.0 pg Final  . MCHC 01/09/2018 33.2  30.0 - 36.0 g/dL Final  . RDW 01/09/2018 12.6  11.5 - 15.5 % Final   . Platelets 01/09/2018 187  150 - 400 K/uL Final  . nRBC 01/09/2018 0.0  0.0 - 0.2 % Final   Performed at [facility], [place] [address], [place], [place] [address]  . Sodium 01/09/2018 138  135 - 145 mmol/L Final  . Potassium 01/09/2018 4.1  3.5 - 5.1 mmol/L Final  . Chloride 01/09/2018 108  98 - 111 mmol/L Final  . CO2 01/09/2018 21* 22 - 32 mmol/L Final  . Glucose, Bld 01/09/2018 123* 70 - 99 mg/dL Final  . BUN 01/09/2018 16  8 - 23 mg/dL Final  . Creatinine, Ser 01/09/2018 1.05  0.61 - 1.24 mg/dL Final  . Calcium 01/09/2018 8.5* 8.9 - 10.3 mg/dL Final  . GFR calc non Af Amer 01/09/2018 >60  >60 mL/min Final  . GFR calc Af Amer 01/09/2018 >60  >60 mL/min Final  . Anion gap 01/09/2018 9  5 - 15 Final   Performed at [facility], [place] [address] [place], [place] [address]  Hospital Outpatient Visit on 12/24/2017  Component Date Value Ref Range Status  . MRSA, PCR 12/24/2017 NEGATIVE  NEGATIVE Final  . Staphylococcus aureus 12/24/2017 POSITIVE* NEGATIVE Final   Comment: (NOTE) The Xpert SA Assay (FDA approved  for NASAL specimens in patients 5 years of age and older), is one component of a comprehensive surveillance program. It is not intended to diagnose infection nor to guide or monitor treatment. Performed at [facility], [place] [address] [place], [place] [address]   . aPTT 12/24/2017 35  24 - 36 seconds Final   Performed at [facility], [place] [address], [place], [place] [address]  . WBC 12/24/2017 5.8  4.0 - 10.5 K/uL Final  . RBC 12/24/2017 5.41  4.22 - 5.81 MIL/uL Final  . Hemoglobin 12/24/2017 16.4  13.0 - 17.0 g/dL Final  . HCT 12/24/2017 49.8  39.0 - 52.0 % Final  . MCV 12/24/2017 92.1  80.0 - 100.0 fL Final  . MCH 12/24/2017 30.3  26.0 - 34.0 pg Final  . MCHC 12/24/2017 32.9  30.0 - 36.0 g/dL Final  . RDW 12/24/2017 12.8  11.5 - 15.5 % Final  . Platelets 12/24/2017 194  150 - 400 K/uL Final  .  nRBC 12/24/2017 0.0  0.0 - 0.2 % Final   Performed at [facility], [place] [address] [place], [place] [address]  . Sodium 12/24/2017 139  135 - 145 mmol/L Final  . Potassium 12/24/2017 4.3  3.5 - 5.1 mmol/L Final  . Chloride 12/24/2017 107  98 - 111 mmol/L Final  . CO2 12/24/2017 23  22 - 32 mmol/L Final  . Glucose, Bld 12/24/2017 89  70 - 99 mg/dL Final  . BUN 12/24/2017 21  8 - 23 mg/dL Final  . Creatinine, Ser 12/24/2017 1.07  0.61 - 1.24 mg/dL Final  . Calcium 12/24/2017 9.3  8.9 - 10.3 mg/dL Final  . Total Protein 12/24/2017 7.4  6.5 - 8.1 g/dL Final  . Albumin 12/24/2017 4.3  3.5 - 5.0 g/dL Final  . AST 12/24/2017 27  15 - 41 U/L Final  . ALT 12/24/2017 26  0 - 44 U/L Final  . Alkaline Phosphatase 12/24/2017 61  38 - 126 U/L Final  . Total Bilirubin 12/24/2017 0.7  0.3 - 1.2 mg/dL Final  . GFR calc non Af Amer 12/24/2017 >60  >60 mL/min Final  . GFR calc Af Amer 12/24/2017 >60  >60 mL/min Final  . Anion gap 12/24/2017 9  5 - 15 Final   Performed at [place] [address], [place], [place] [address]  . Prothrombin Time 12/24/2017 12.6  11.4 - 15.2 seconds Final  . INR 12/24/2017 0.95   Final   Performed at [facility], [place] [address], [place], [place] [address]     X-Rays:Dg Pelvis Portable  Result Date: 01/08/2018 CLINICAL DATA:  Status post right total hip joint prosthesis placement. EXAM: PORTABLE PELVIS 1-2 VIEWS COMPARISON:  Fluoro spot radiographs of today's date of the right hip. FINDINGS: The patient has undergone total right hip joint prosthesis placement. Radiographic positioning of the prosthetic components is good. The interface with the native bone appears normal. IMPRESSION: No immediate postprocedure complication following right hip joint prosthesis placement. Electronically Signed   By: David  Martinique M.D.   On: 01/08/2018 12:39   Dg C-arm 1-60 Min-no Report  Result Date: 01/08/2018 Fluoroscopy was  utilized by the requesting physician.  No radiographic interpretation.   Dg Hip Operative Unilat With Pelvis Right  Result Date: 01/08/2018 CLINICAL DATA:  Right hip replacement EXAM: OPERATIVE RIGHT HIP (WITH PELVIS IF PERFORMED) 2 VIEWS TECHNIQUE: Fluoroscopic spot image(s) were submitted for interpretation post-operatively. COMPARISON:  10/28/2014 FINDINGS: Changes  of right hip replacement. Normal AP alignment. No hardware bony complicating feature. IMPRESSION: Right hip replacement.  No visible complicating feature. Electronically Signed   By: Rolm Baptise M.D.   On: 01/08/2018 11:49    EKG: Orders placed or performed in visit on 10/15/17  . EKG 12-Lead     Hospital Course: Brendan Bell is a 67 y.o. who was admitted to [facility]. They were brought to the operating room on 01/08/2018 and underwent Procedure(s): RIGHT TOTAL HIP ARTHROPLASTY ANTERIOR APPROACH.  Patient tolerated the procedure well and was later transferred to the recovery room and then to the orthopaedic floor for postoperative care. They were given PO and IV analgesics for pain control following their surgery. They were given 24 hours of postoperative antibiotics of  Anti-infectives (From admission, onward)   Start     Dose/Rate Route Frequency Ordered Stop   01/08/18 1600  ceFAZolin (ANCEF) IVPB 2g/100 mL premix     2 g 200 mL/hr over 30 Minutes Intravenous Every 6 hours 01/08/18 1315 01/08/18 2231   01/08/18 0745  ceFAZolin (ANCEF) IVPB 2g/100 mL premix     2 g 200 mL/hr over 30 Minutes Intravenous On call to O.R. 01/08/18 2542 01/08/18 1024     and started on DVT prophylaxis in the form of Aspirin.   PT and OT were ordered for total joint protocol. Discharge planning consulted to help with postop disposition and equipment needs.  Patient had a good night on the evening of surgery. They started to get up OOB with therapy on POD #0. Pt was seen during rounds and was ready to go home pending progress with  therapy. Hemovac drain was pulled without difficulty. He worked with therapy on POD #1 and was meeting his goals. Pt was discharged to home later that day in stable condition.  Diet: Regular diet Activity: WBAT Follow-up: in 2 weeks with Dr. Wynelle Link Disposition: Home with HEP Discharged Condition: stable   Discharge Instructions    Call MD / Call 911   Complete by:  As directed    If you experience chest pain or shortness of breath, CALL 911 and be transported to the hospital emergency room.  If you develope a fever above 101 F, pus (white drainage) or increased drainage or redness at the wound, or calf pain, call your surgeon's office.   Change dressing   Complete by:  As directed    You may change your dressing on Friday, then change the dressing daily with sterile 4 x 4 inch gauze dressing and paper tape.   Constipation Prevention   Complete by:  As directed    Drink plenty of fluids.  Prune juice may be helpful.  You may use a stool softener, such as Colace (over the counter) 100 mg twice a day.  Use MiraLax (over the counter) for constipation as needed.   Diet - low sodium heart healthy   Complete by:  As directed    Discharge instructions   Complete by:  As directed    Dr. Gaynelle Arabian Total Joint Specialist [facility] [address], [place], [place] [address] [phone]  ANTERIOR APPROACH TOTAL HIP REPLACEMENT POSTOPERATIVE DIRECTIONS   Hip Rehabilitation, Guidelines Following Surgery  The results of a hip operation are greatly improved after range of motion and muscle strengthening exercises. Follow all safety measures which are given to protect your hip. If any of these exercises cause increased pain or swelling in your joint, decrease the amount  until you are comfortable again. Then slowly increase the exercises. Call your caregiver if you have problems or questions.   HOME CARE INSTRUCTIONS  Remove items at home which could result in a fall. This  includes throw rugs or furniture in walking pathways.  ICE to the affected hip every three hours for 30 minutes at a time and then as needed for pain and swelling.  Continue to use ice on the hip for pain and swelling from surgery. You may notice swelling that will progress down to the foot and ankle.  This is normal after surgery.  Elevate the leg when you are not up walking on it.   Continue to use the breathing machine which will help keep your temperature down.  It is common for your temperature to cycle up and down following surgery, especially at night when you are not up moving around and exerting yourself.  The breathing machine keeps your lungs expanded and your temperature down.  DIET You may resume your previous home diet once your are discharged from the hospital.  DRESSING / WOUND CARE / SHOWERING You may shower 3 days after surgery, but keep the wounds dry during showering.  You may use an occlusive plastic wrap (Press'n Seal for example), NO SOAKING/SUBMERGING IN THE BATHTUB.  If the bandage gets wet, change with a clean dry gauze.  If the incision gets wet, pat the wound dry with a clean towel. You may start showering once you are discharged home but do not submerge the incision under water. Just pat the incision dry and apply a dry gauze dressing on daily. Change the surgical dressing daily and reapply a dry dressing each time.  ACTIVITY Walk with your walker as instructed. Use walker as long as suggested by your caregivers. Avoid periods of inactivity such as sitting longer than an hour when not asleep. This helps prevent blood clots.  You may resume a sexual relationship in one month or when given the OK by your doctor.  You may return to work once you are cleared by your doctor.  Do not drive a car for 6 weeks or until released by you surgeon.  Do not drive while taking narcotics.  WEIGHT BEARING Weight bearing as tolerated with assist device (walker, cane, etc) as directed,  use it as long as suggested by your surgeon or therapist, typically at least 4-6 weeks.  POSTOPERATIVE CONSTIPATION PROTOCOL Constipation - defined medically as fewer than three stools per week and severe constipation as less than one stool per week.  One of the most common issues patients have following surgery is constipation.  Even if you have a regular bowel pattern at home, your normal regimen is likely to be disrupted due to multiple reasons following surgery.  Combination of anesthesia, postoperative narcotics, change in appetite and fluid intake all can affect your bowels.  In order to avoid complications following surgery, here are some recommendations in order to help you during your recovery period.  Colace (docusate) - Pick up an over-the-counter form of Colace or another stool softener and take twice a day as long as you are requiring postoperative pain medications.  Take with a full glass of water daily.  If you experience loose stools or diarrhea, hold the colace until you stool forms back up.  If your symptoms do not get better within 1 week or if they get worse, check with your doctor.  Dulcolax (bisacodyl) - Pick up over-the-counter and take as directed by the product  packaging as needed to assist with the movement of your bowels.  Take with a full glass of water.  Use this product as needed if not relieved by Colace only.   MiraLax (polyethylene glycol) - Pick up over-the-counter to have on hand.  MiraLax is a solution that will increase the amount of water in your bowels to assist with bowel movements.  Take as directed and can mix with a glass of water, juice, soda, coffee, or tea.  Take if you go more than two days without a movement. Do not use MiraLax more than once per day. Call your doctor if you are still constipated or irregular after using this medication for 7 days in a row.  If you continue to have problems with postoperative constipation, please contact the office for  further assistance and recommendations.  If you experience "the worst abdominal pain ever" or develop nausea or vomiting, please contact the office immediatly for further recommendations for treatment.  ITCHING  If you experience itching with your medications, try taking only a single pain pill, or even half a pain pill at a time.  You can also use Benadryl over the counter for itching or also to help with sleep.   TED HOSE STOCKINGS Wear the elastic stockings on both legs for three weeks following surgery during the day but you may remove then at night for sleeping.  MEDICATIONS See your medication summary on the "After Visit Summary" that the nursing staff will review with you prior to discharge.  You may have some home medications which will be placed on hold until you complete the course of blood thinner medication.  It is important for you to complete the blood thinner medication as prescribed by your surgeon.  Continue your approved medications as instructed at time of discharge.  PRECAUTIONS If you experience chest pain or shortness of breath - call 911 immediately for transfer to the hospital emergency department.  If you develop a fever greater that 101 F, purulent drainage from wound, increased redness or drainage from wound, foul odor from the wound/dressing, or calf pain - CONTACT YOUR SURGEON.                                                   FOLLOW-UP APPOINTMENTS Make sure you keep all of your appointments after your operation with your surgeon and caregivers. You should call the office at the above phone number and make an appointment for approximately two weeks after the date of your surgery or on the date instructed by your surgeon outlined in the "After Visit Summary".  RANGE OF MOTION AND STRENGTHENING EXERCISES  These exercises are designed to help you keep full movement of your hip joint. Follow your caregiver's or physical therapist's instructions. Perform all exercises  about fifteen times, three times per day or as directed. Exercise both hips, even if you have had only one joint replacement. These exercises can be done on a training (exercise) mat, on the floor, on a table or on a bed. Use whatever works the best and is most comfortable for you. Use music or television while you are exercising so that the exercises are a pleasant break in your day. This will make your life better with the exercises acting as a break in routine you can look forward to.  Lying on your back,  slowly slide your foot toward your buttocks, raising your knee up off the floor. Then slowly slide your foot back down until your leg is straight again.  Lying on your back spread your legs as far apart as you can without causing discomfort.  Lying on your side, raise your upper leg and foot straight up from the floor as far as is comfortable. Slowly lower the leg and repeat.  Lying on your back, tighten up the muscle in the front of your thigh (quadriceps muscles). You can do this by keeping your leg straight and trying to raise your heel off the floor. This helps strengthen the largest muscle supporting your knee.  Lying on your back, tighten up the muscles of your buttocks both with the legs straight and with the knee bent at a comfortable angle while keeping your heel on the floor.   IF YOU ARE TRANSFERRED TO A SKILLED REHAB FACILITY If the patient is transferred to a skilled rehab facility following release from the hospital, a list of the current medications will be sent to the facility for the patient to continue.  When discharged from the skilled rehab facility, please have the facility set up the patient's [place] prior to being released. Also, the skilled facility will be responsible for providing the patient with their medications at time of release from the facility to include their pain medication, the muscle relaxants, and their blood thinner medication. If the patient  is still at the rehab facility at time of the two week follow up appointment, the skilled rehab facility will also need to assist the patient in arranging follow up appointment in our office and any transportation needs.  MAKE SURE YOU:  Understand these instructions.  Get help right away if you are not doing well or get worse.    Pick up stool softner and laxative for home use following surgery while on pain medications. Do not submerge incision under water. Please use good hand washing techniques while changing dressing each day. May shower starting three days after surgery. Please use a clean towel to pat the incision dry following showers. Continue to use ice for pain and swelling after surgery. Do not use any lotions or creams on the incision until instructed by your surgeon.   Do not sit on low chairs, stoools or toilet seats, as it may be difficult to get up from low surfaces   Complete by:  As directed    Driving restrictions   Complete by:  As directed    No driving for two weeks   TED hose   Complete by:  As directed    Use stockings (TED hose) for three weeks on both leg(s).  You may remove them at night for sleeping.   Weight bearing as tolerated   Complete by:  As directed      Allergies as of 01/09/2018   No Known Allergies     Medication List    STOP taking these medications   meloxicam 15 MG tablet Commonly known as:  MOBIC     TAKE these medications   acetaminophen 650 MG CR tablet Commonly known as:  TYLENOL Take 650 mg by mouth every 8 (eight) hours as needed for pain.   aspirin 325 MG EC tablet Take 1 tablet (325 mg total) by mouth 2 (two) times daily for 20 days. Take one tablet (325 mg) Aspirin two times a day for three weeks following surgery. Then take one baby Aspirin (  81 mg) once a day for three weeks. Then discontinue aspirin.   citalopram 40 MG tablet Commonly known as:  CELEXA TAKE 1 TABLET (40 MG TOTAL) BY MOUTH DAILY. What changed:  See  the new instructions.   DHEA 25 MG Caps Take 25 mg by mouth daily.   HYDROcodone-acetaminophen 5-325 MG tablet Commonly known as:  NORCO/VICODIN Take 1-2 tablets by mouth every 6 (six) hours as needed for moderate pain (pain score 4-6).   methocarbamol 500 MG tablet Commonly known as:  ROBAXIN Take 1 tablet (500 mg total) by mouth every 6 (six) hours as needed for muscle spasms.   MULTIVITAMIN PO Take 1 tablet by mouth daily.   OVER THE COUNTER MEDICATION Take 1 capsule by mouth daily. CBD Oil Supplement            Discharge Care Instructions  (From admission, onward)         Start     Ordered   01/09/18 0000  Weight bearing as tolerated     01/09/18 0755   01/09/18 0000  Change dressing    Comments:  You may change your dressing on Friday, then change the dressing daily with sterile 4 x 4 inch gauze dressing and paper tape.   01/09/18 0755         Follow-up Information    Jisele Price, [place], PA. Go on 01/24/2018.   Specialty:  Orthopedic Surgery Why:  You are scheduled for a post-operative appointment with Theresa Duty, PA for Dr. Wynelle Link on 01-24-18 at 10:00 am.  Contact information: [address] [place] [address] [phone]           Signed: Theresa Duty, PA-C Orthopedic Surgery 01/13/2018, 11:43 AM

## 2018-01-21 ENCOUNTER — Telehealth: Payer: Self-pay

## 2018-01-21 NOTE — Telephone Encounter (Signed)
Left message to see how he was doing after recent hip replacement.

## 2018-02-18 DIAGNOSIS — Z96641 Presence of right artificial hip joint: Secondary | ICD-10-CM | POA: Diagnosis not present

## 2018-02-18 DIAGNOSIS — Z471 Aftercare following joint replacement surgery: Secondary | ICD-10-CM | POA: Diagnosis not present

## 2018-03-24 ENCOUNTER — Ambulatory Visit (INDEPENDENT_AMBULATORY_CARE_PROVIDER_SITE_OTHER): Payer: Medicare Other | Admitting: Internal Medicine

## 2018-03-24 ENCOUNTER — Encounter: Payer: Self-pay | Admitting: Internal Medicine

## 2018-03-24 VITALS — BP 130/90 | HR 85 | Temp 98.3°F | Ht 66.5 in | Wt 185.0 lb

## 2018-03-24 DIAGNOSIS — L570 Actinic keratosis: Secondary | ICD-10-CM | POA: Diagnosis not present

## 2018-03-24 DIAGNOSIS — Z0000 Encounter for general adult medical examination without abnormal findings: Secondary | ICD-10-CM | POA: Diagnosis not present

## 2018-03-24 DIAGNOSIS — Z7189 Other specified counseling: Secondary | ICD-10-CM | POA: Insufficient documentation

## 2018-03-24 DIAGNOSIS — F3340 Major depressive disorder, recurrent, in remission, unspecified: Secondary | ICD-10-CM | POA: Diagnosis not present

## 2018-03-24 NOTE — Assessment & Plan Note (Signed)
I have personally reviewed the Medicare Annual Wellness questionnaire and have noted 1. The patient's medical and social history 2. Their use of alcohol, tobacco or illicit drugs 3. Their current medications and supplements 4. The patient's functional ability including ADL's, fall risks, home safety risks and hearing or visual             impairment. 5. Diet and physical activities 6. Evidence for depression or mood disorders  The patients weight, height, BMI and visual acuity have been recorded in the chart I have made referrals, counseling and provided education to the patient based review of the above and I have provided the pt with a written personalized care plan for preventive services.  I have provided you with a copy of your personalized plan for preventive services. Please take the time to review along with your updated medication list.  Prefers no pneumonia vaccines---will discuss next year again No flu vaccines---encouraged him to take next year Due for colon in December Defer PSA till next year Stays active

## 2018-03-24 NOTE — Assessment & Plan Note (Signed)
See social history 

## 2018-03-24 NOTE — Assessment & Plan Note (Signed)
Discussed options Verbal consent Liquid nitrogen 30 seconds x 2 Tolerated well Discussed home care Dermatologist if recurs

## 2018-03-24 NOTE — Progress Notes (Signed)
Subjective:    Patient ID: Brendan Bell, male    DOB: 04-Jun-1950, 68 y.o.   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 acetaminophen (TYLENOL) 650 MG CR tablet Take 650 mg by mouth every 8 (eight) hours as needed for pain.    . citalopram (CELEXA) 40 MG tablet TAKE 1 TABLET (40 MG TOTAL) BY MOUTH DAILY. (Patient taking differently: Take 40 mg by mouth daily. ) 90 tablet 3  . DHEA 25 MG CAPS Take 25 mg by mouth daily.    . Multiple Vitamins-Minerals (MULTIVITAMIN PO) Take 1 tablet by mouth daily.    Marland Kitchen OVER THE COUNTER MEDICATION Take 1 capsule by mouth daily. CBD Oil Supplement     No current facility-administered medications on file prior to visit.     Not on File  Past Medical History:  Diagnosis Date  . Anxiety   . Depression   . Hyperlipidemia   . Osteoarthritis   . Pneumonia 10/04/98   out patient  . Rhinitis, allergic     Past Surgical History:  Procedure Laterality Date  . COLONOSCOPY    . EYE SURGERY Bilateral   . JOINT REPLACEMENT  2/13///9/13   medial right knee///medial left knee---Dr Jefm Bryant  . KNEE ARTHROSCOPY W/ PARTIAL MEDIAL MENISCECTOMY Right 2008   with pateller chondroplasty ([facility])  . KNEE SURGERY  1992   right, loose body removed  . KNEE SURGERY     multiple surgeries to both knees  . MEDIAL PARTIAL KNEE REPLACEMENT Left    Dr. Jefm Bryant  . MEDIAL PARTIAL KNEE REPLACEMENT Right    Dr. Jefm Bryant  . TONSILLECTOMY    . TOTAL HIP ARTHROPLASTY Right 01/08/2018   Procedure: RIGHT TOTAL HIP ARTHROPLASTY ANTERIOR APPROACH;  Surgeon: Gaynelle Arabian, MD;  Location: [facility];  Service: Orthopedics;  Laterality: Right;  157min  . TOTAL KNEE ARTHROPLASTY WITH REVISION COMPONENTS Left 02/21/2015   Procedure: TOTAL KNEE ARTHROPLASTY WITH REVISION COMPONENTS;  Surgeon: Frederik Pear, MD;  Location: [facility];  Service: Orthopedics;  Laterality: Left;    Family History  Problem Relation Age of Onset  . Heart disease Mother        CAD, MI  . Hypertension Mother   . Stroke Father   . Aneurysm Father        died from rupture of AAA  . Cancer Paternal Grandfather        prostate    Social History   Socioeconomic History  . Marital status: Married    Spouse name: Not on file  . Number of children: 5  . Years of education: Not on file  . Highest education level: Not on file  Occupational History  . 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 Occupation: Landlord  Social Needs  . Financial resource strain: Not on file  . Food insecurity:    Worry: Not on file    Inability: Not  on file  . Transportation needs:    Medical: Not on file    Non-medical: Not on file  Tobacco Use  . Smoking status: Never Smoker  . Smokeless tobacco: Never Used  Substance and Sexual Activity  . Alcohol use: Yes    Comment: 2 beers a day in general.  . Drug use: No  . Sexual activity: Not on file  Lifestyle  . Physical activity:    Days per week: Not on file    Minutes per session: Not on file  . Stress: Not on file  Relationships  . Social connections:    Talks on phone: Not on file    Gets together: Not on file    Attends religious service: Not on file    Active member of club or organization: Not on file    Attends meetings of clubs or organizations: Not on file    Relationship status: Not on file  . Intimate  partner violence:    Fear of current or ex partner: Not on file    Emotionally abused: Not on file    Physically abused: Not on file    Forced sexual activity: Not on file  Other Topics Concern  . Not on file  Social History Narrative   2nd marriage.  3 children from first marriage, 2 stepchildren, 2 with current marriage.      Has living will   Wife is health care POA--doesn't remember the alternate   Would accept resuscitation   Would accept tube feeds--at least for a while   Review of Systems Appetite is good Weight is stable---trying to bring it down some Sleeps well in general Wears seat belt Teeth are fine---keeps up with dentist Has spot on right temple to be checked----reassured (seborrheic keratosis) Bowels are fine. No fine Voids well---nocturia x 1-2. Flow is fine No heartburn or dysphagia No chest pain or SOB No dizziness or syncope    Objective:   Physical Exam  Constitutional: He is oriented to person, place, and time. He appears well-developed. No distress.  HENT:  Mouth/Throat: Oropharynx is clear and moist. No oropharyngeal exudate.  Neck: No thyromegaly present.  Cardiovascular: Normal rate, regular rhythm, normal heart sounds and intact distal pulses. Exam reveals no gallop.  No murmur heard. Respiratory: Effort normal and breath sounds normal. No respiratory distress. He has no wheezes. He has no rales.  GI: Soft. There is no abdominal tenderness.  Musculoskeletal:        General: No tenderness or edema.  Lymphadenopathy:    He has no cervical adenopathy.  Neurological: He is alert and oriented to person, place, and time.  President--- "Daisy Floro, Obama, Bush" [phone] D-l-o-r-w Recall 3/3  Skin: No rash noted.  89mm actinic on nose  Psychiatric: He has a normal mood and affect. His behavior is normal.           Assessment & Plan:

## 2018-03-24 NOTE — Assessment & Plan Note (Signed)
Doing well on the medication Has relapsed in past--so prefers no wean

## 2018-03-24 NOTE — Progress Notes (Signed)
Hearing Screening   Method: Audiometry   125Hz  250Hz  500Hz  1000Hz  2000Hz  3000Hz  4000Hz  6000Hz  8000Hz   Right ear:   20 20 20  20     Left ear:   25 20 20  25       Visual Acuity Screening   Right eye Left eye Both eyes  Without correction:     With correction: 20/25 20/13 20/13   Comments: [facility]

## 2018-03-25 ENCOUNTER — Encounter: Payer: PRIVATE HEALTH INSURANCE | Admitting: Internal Medicine

## 2018-03-31 DIAGNOSIS — M25562 Pain in left knee: Secondary | ICD-10-CM | POA: Diagnosis not present

## 2018-04-15 DIAGNOSIS — M25562 Pain in left knee: Secondary | ICD-10-CM | POA: Diagnosis not present

## 2018-04-15 DIAGNOSIS — M25462 Effusion, left knee: Secondary | ICD-10-CM | POA: Diagnosis not present

## 2018-04-25 ENCOUNTER — Other Ambulatory Visit (HOSPITAL_COMMUNITY): Payer: Self-pay | Admitting: Orthopedic Surgery

## 2018-04-25 ENCOUNTER — Other Ambulatory Visit: Payer: Self-pay | Admitting: Orthopedic Surgery

## 2018-04-25 DIAGNOSIS — M25562 Pain in left knee: Secondary | ICD-10-CM

## 2018-05-15 DIAGNOSIS — M25562 Pain in left knee: Secondary | ICD-10-CM | POA: Diagnosis not present

## 2018-05-23 ENCOUNTER — Encounter (HOSPITAL_COMMUNITY): Admission: RE | Admit: 2018-05-23 | Payer: Medicare Other | Source: Ambulatory Visit

## 2018-05-23 ENCOUNTER — Encounter (HOSPITAL_COMMUNITY): Payer: Self-pay

## 2018-09-10 ENCOUNTER — Other Ambulatory Visit: Payer: Self-pay | Admitting: Internal Medicine

## 2019-03-26 ENCOUNTER — Encounter: Payer: Medicare Other | Admitting: Internal Medicine

## 2019-04-29 ENCOUNTER — Other Ambulatory Visit: Payer: Self-pay

## 2019-04-29 ENCOUNTER — Ambulatory Visit (INDEPENDENT_AMBULATORY_CARE_PROVIDER_SITE_OTHER): Payer: Medicare Other | Admitting: Internal Medicine

## 2019-04-29 ENCOUNTER — Encounter: Payer: Self-pay | Admitting: Internal Medicine

## 2019-04-29 VITALS — BP 124/88 | HR 74 | Temp 97.1°F | Ht 67.0 in | Wt 180.0 lb

## 2019-04-29 DIAGNOSIS — Z7189 Other specified counseling: Secondary | ICD-10-CM | POA: Diagnosis not present

## 2019-04-29 DIAGNOSIS — Z0000 Encounter for general adult medical examination without abnormal findings: Secondary | ICD-10-CM | POA: Diagnosis not present

## 2019-04-29 DIAGNOSIS — Z125 Encounter for screening for malignant neoplasm of prostate: Secondary | ICD-10-CM

## 2019-04-29 DIAGNOSIS — F3340 Major depressive disorder, recurrent, in remission, unspecified: Secondary | ICD-10-CM | POA: Diagnosis not present

## 2019-04-29 DIAGNOSIS — E785 Hyperlipidemia, unspecified: Secondary | ICD-10-CM | POA: Diagnosis not present

## 2019-04-29 DIAGNOSIS — Z23 Encounter for immunization: Secondary | ICD-10-CM

## 2019-04-29 DIAGNOSIS — M170 Bilateral primary osteoarthritis of knee: Secondary | ICD-10-CM

## 2019-04-29 LAB — LIPID PANEL
Cholesterol: 232 mg/dL — ABNORMAL HIGH (ref 0–200)
HDL: 49.2 mg/dL (ref >39.00)
LDL Cholesterol: 165 mg/dL — ABNORMAL HIGH (ref 0–99)
NonHDL: 182.94
Total CHOL/HDL Ratio: 5
Triglycerides: 90 mg/dL (ref 0.0–149.0)
VLDL: 18 mg/dL (ref 0.0–40.0)

## 2019-04-29 LAB — CBC
HCT: 49.4 % (ref 39.0–52.0)
Hemoglobin: 16.5 g/dL (ref 13.0–17.0)
MCHC: 33.5 g/dL (ref 30.0–36.0)
MCV: 89.9 fl (ref 78.0–100.0)
Platelets: 185 10*3/uL (ref 150.0–400.0)
RBC: 5.5 Mil/uL (ref 4.22–5.81)
RDW: 14.6 % (ref 11.5–15.5)
WBC: 6.2 10*3/uL (ref 4.0–10.5)

## 2019-04-29 LAB — COMPREHENSIVE METABOLIC PANEL
ALT: 18 U/L (ref 0–53)
AST: 22 U/L (ref 0–37)
Albumin: 4.3 g/dL (ref 3.5–5.2)
Alkaline Phosphatase: 60 U/L (ref 39–117)
BUN: 22 mg/dL (ref 6–23)
CO2: 28 mEq/L (ref 19–32)
Calcium: 9.3 mg/dL (ref 8.4–10.5)
Chloride: 104 mEq/L (ref 96–112)
Creatinine, Ser: 1.08 mg/dL (ref 0.40–1.50)
GFR: 68.28 mL/min (ref >60.00)
Glucose, Bld: 89 mg/dL (ref 70–99)
Potassium: 4.2 mEq/L (ref 3.5–5.1)
Sodium: 139 mEq/L (ref 135–145)
Total Bilirubin: 0.7 mg/dL (ref 0.2–1.2)
Total Protein: 6.7 g/dL (ref 6.0–8.3)

## 2019-04-29 LAB — PSA, MEDICARE: PSA: 1.26 ng/ml (ref 0.10–4.00)

## 2019-04-29 NOTE — Assessment & Plan Note (Signed)
See social history 

## 2019-04-29 NOTE — Assessment & Plan Note (Signed)
Doing well now--with no meds No action for now

## 2019-04-29 NOTE — Patient Instructions (Signed)
Please set up your colonoscopy 

## 2019-04-29 NOTE — Assessment & Plan Note (Signed)
Doing better with fitness and lifestyle changes Rare tylenol

## 2019-04-29 NOTE — Progress Notes (Signed)
Hearing Screening   125Hz  250Hz  500Hz  1000Hz  2000Hz  3000Hz  4000Hz  6000Hz  8000Hz   Right ear:   20 20 20  25     Left ear:   25 25 20   40      Visual Acuity Screening   Right eye Left eye Both eyes  Without correction: 20/50 20/13 20/13   With correction:

## 2019-04-29 NOTE — Addendum Note (Signed)
Addended by: Pilar Grammes on: 04/29/2019 09:24 AM   Modules accepted: Orders

## 2019-04-29 NOTE — Progress Notes (Signed)
Subjective:    Patient ID: Brendan Bell, male    DOB: [birth year], 69 y.o.   MRN: [id number]  HPI Here for Medicare wellness visit and follow up of chronic health conditions This visit occurred during the SARS-CoV-2 public health emergency.  Safety protocols were in place, including screening questions prior to the visit, additional usage of staff PPE, and extensive cleaning of exam room while observing appropriate contact time as indicated for disinfecting solutions.   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 acetaminophen (TYLENOL) 650 MG CR tablet Take 650 mg by mouth every 8 (eight) hours as needed for pain.    . Cholecalciferol (EQL VITAMIN D3) 50 MCG (2000 UT) CAPS Take by mouth.    . Cyanocobalamin (B-12 PO) Take by mouth.    . DHA-EPA-GLA PO Take by mouth.    . Ferrous Sulfate (IRON PO) Take by mouth.    Marland Kitchen MAGNESIUM PO Take by mouth.    . Multiple Vitamins-Minerals (MULTIVITAMIN PO) Take 1 tablet by mouth daily.    . Pyridoxine HCl (B-6 PO) Take by mouth.    Marland Kitchen ZINC OXIDE PO Take by mouth.     No current  facility-administered medications on file prior to visit.    No Known Allergies  Past Medical History:  Diagnosis Date  . Anxiety   . Depression   . Hyperlipidemia   . Osteoarthritis   . Pneumonia 10/04/98   out patient  . Rhinitis, allergic     Past Surgical History:  Procedure Laterality Date  . COLONOSCOPY    . EYE SURGERY Bilateral   . JOINT REPLACEMENT  2/13///9/13   medial right knee///medial left knee---Dr Jefm Bryant  . KNEE ARTHROSCOPY W/ PARTIAL MEDIAL MENISCECTOMY Right 2008   with pateller chondroplasty ([facility])  . KNEE SURGERY  1992   right, loose body removed  . KNEE SURGERY     multiple surgeries to both knees  . MEDIAL PARTIAL KNEE REPLACEMENT Left    Dr. Jefm Bryant  . MEDIAL PARTIAL KNEE REPLACEMENT Right    Dr. Jefm Bryant  . TONSILLECTOMY    . TOTAL HIP ARTHROPLASTY Right 01/08/2018   Procedure: RIGHT TOTAL HIP ARTHROPLASTY ANTERIOR APPROACH;  Surgeon: Gaynelle Arabian, MD;  Location: [facility];  Service: Orthopedics;  Laterality: Right;  147min  . TOTAL KNEE ARTHROPLASTY WITH REVISION COMPONENTS Left 02/21/2015   Procedure: TOTAL KNEE ARTHROPLASTY WITH REVISION COMPONENTS;  Surgeon: Frederik Pear, MD;  Location: [facility];  Service: Orthopedics;  Laterality: Left;    Family History  Problem Relation Age of Onset  . Heart disease Mother        CAD, MI  . Hypertension Mother   .  Stroke Father   . Aneurysm Father        died from rupture of AAA  . Cancer Paternal Grandfather        prostate    Social History   Socioeconomic History  . Marital status: Married    Spouse name: Not on file  . Number of children: 5  . Years of education: Not on file  . Highest education level: Not on file  Occupational History  . 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 Occupation: Landlord  Tobacco Use  . Smoking status: Never Smoker  . Smokeless tobacco: Never Used  Substance and Sexual Activity  . Alcohol use: Yes    Comment: 2 beers a day in  general.  . Drug use: No  . Sexual activity: Not on file  Other Topics Concern  . Not on file  Social History Narrative   2nd marriage.  3 children from first marriage, 2 stepchildren, 2 with current marriage.      Has living will   Wife is health care POA--son Hilliard Clark would be alternate   Would accept resuscitation   Would accept tube feeds briefly but not if cognitively unaware   Social Determinants of Health   Financial Resource Strain:   . Difficulty of Paying Living Expenses:   Food Insecurity:   . Worried About Charity fundraiser in the Last Year:   . Arboriculturist in the Last Year:   Transportation Needs:   . Film/video editor (Medical):   Marland Kitchen Lack of Transportation (Non-Medical):   Physical Activity:   . Days of Exercise per Week:   . Minutes of Exercise per Session:   Stress:   . Feeling of Stress :   Social Connections:   . Frequency of Communication with Friends and Family:   . Frequency of Social Gatherings with Friends and Family:   . Attends Religious Services:   . Active Member of Clubs or Organizations:   . Attends Archivist Meetings:   Marland Kitchen Marital Status:   Intimate Partner Violence:   . Fear of Current or Ex-Partner:   . Emotionally Abused:   Marland Kitchen Physically Abused:   . Sexually Abused:    Review of Systems Appetite is good Weight down some Sexual potency is improved Sleeps well Wears seat belt Teeth okay--keeps up with dentist No rash or suspicious skin lesions No heartburn or dysphagia Bowels are good--no blood Voids well--good flow. Nocturia x 1 No headaches Rare visual auras but no migraines    Objective:   Physical Exam  Constitutional: He is oriented to person, place, and time. He appears well-developed. No distress.  HENT:  Mouth/Throat: Oropharynx is clear and moist. No oropharyngeal exudate.  Neck: No thyromegaly present.  Cardiovascular: Normal rate, regular rhythm, normal heart sounds and intact distal pulses. Exam  reveals no gallop.  No murmur heard. Respiratory: Effort normal and breath sounds normal. No respiratory distress. He has no wheezes. He has no rales.  GI: Soft. There is no abdominal tenderness.  Musculoskeletal:        General: No tenderness or edema.  Lymphadenopathy:    He has no cervical adenopathy.  Neurological: He is alert and oriented to person, place, and time.  President-- "Biden, Trump, Obama" [phone] D-l-r-o-w Recall 2/3  Skin: No rash noted. No erythema.  Psychiatric: He has a normal mood and affect. His behavior is normal.  Assessment & Plan:

## 2019-04-29 NOTE — Assessment & Plan Note (Signed)
Hopefully better with his lifestyle Will recheck Discussed primary prevention with statin--will hold off

## 2019-04-29 NOTE — Assessment & Plan Note (Signed)
I have personally reviewed the Medicare Annual Wellness questionnaire and have noted 1. The patient's medical and social history 2. Their use of alcohol, tobacco or illicit drugs 3. Their current medications and supplements 4. The patient's functional ability including ADL's, fall risks, home safety risks and hearing or visual             impairment. 5. Diet and physical activities 6. Evidence for depression or mood disorders  The patients weight, height, BMI and visual acuity have been recorded in the chart I have made referrals, counseling and provided education to the patient based review of the above and I have provided the pt with a written personalized care plan for preventive services.  I have provided you with a copy of your personalized plan for preventive services. Please take the time to review along with your updated medication list.  Due for colonoscopy-he has gotten recall prevnar today---pneumovax next year Recommended flu vaccine-he will consider Consider shingrix at pharmacy Stays in shape

## 2019-06-04 ENCOUNTER — Ambulatory Visit: Payer: Medicare Other | Admitting: Internal Medicine

## 2019-06-05 ENCOUNTER — Ambulatory Visit (INDEPENDENT_AMBULATORY_CARE_PROVIDER_SITE_OTHER): Payer: Medicare Other | Admitting: Internal Medicine

## 2019-06-05 ENCOUNTER — Encounter: Payer: Self-pay | Admitting: Internal Medicine

## 2019-06-05 ENCOUNTER — Other Ambulatory Visit: Payer: Self-pay

## 2019-06-05 DIAGNOSIS — I861 Scrotal varices: Secondary | ICD-10-CM | POA: Diagnosis not present

## 2019-06-05 NOTE — Progress Notes (Signed)
Subjective:    Patient ID: Brendan Bell, male    DOB: Jun 18, 1950, 69 y.o.   MRN: [id number]  HPI Here due to pain in scrotum This visit occurred during the SARS-CoV-2 public health emergency.  Safety protocols were in place, including screening questions prior to the visit, additional usage of staff PPE, and extensive cleaning of exam room while observing appropriate contact time as indicated for disinfecting solutions.   Started feeling pressure in left scrotum about a month ago Thought it was related to sex--like when they were "frisky" Feels "kinda bruised" Sensation moves into pelvis at times Not really painful No clear swelling or mass  No problems with sex--no pain or blood with ejaculate He feels it all the time though Monogamous  Current Outpatient Medications on File Prior to Visit  Medication Sig Dispense Refill  . acetaminophen (TYLENOL) 650 MG CR tablet Take 650 mg by mouth every 8 (eight) hours as needed for pain.    . Cholecalciferol (EQL VITAMIN D3) 50 MCG (2000 UT) CAPS Take by mouth.    . Cyanocobalamin (B-12 PO) Take by mouth.    . DHA-EPA-GLA PO Take by mouth.    . Ferrous Sulfate (IRON PO) Take by mouth.    Marland Kitchen MAGNESIUM PO Take by mouth.    . Multiple Vitamins-Minerals (MULTIVITAMIN PO) Take 1 tablet by mouth daily.    . Pyridoxine HCl (B-6 PO) Take by mouth.    Marland Kitchen ZINC OXIDE PO Take by mouth.     No current facility-administered medications on file prior to visit.    No Known Allergies  Past Medical History:  Diagnosis Date  . Anxiety   . Depression   . Hyperlipidemia   . Osteoarthritis   . Pneumonia 10/04/98   out patient  . Rhinitis, allergic     Past Surgical History:  Procedure Laterality Date  . COLONOSCOPY    . EYE SURGERY Bilateral   . JOINT REPLACEMENT  2/13///9/13   medial right knee///medial left knee---Dr Jefm Bryant  . KNEE ARTHROSCOPY W/ PARTIAL MEDIAL MENISCECTOMY Right 2008   with pateller chondroplasty ([facility])  . KNEE SURGERY   1992   right, loose body removed  . KNEE SURGERY     multiple surgeries to both knees  . MEDIAL PARTIAL KNEE REPLACEMENT Left    Dr. Jefm Bryant  . MEDIAL PARTIAL KNEE REPLACEMENT Right    Dr. Jefm Bryant  . TONSILLECTOMY    . TOTAL HIP ARTHROPLASTY Right 01/08/2018   Procedure: RIGHT TOTAL HIP ARTHROPLASTY ANTERIOR APPROACH;  Surgeon: Gaynelle Arabian, MD;  Location: [facility];  Service: Orthopedics;  Laterality: Right;  125min  . TOTAL KNEE ARTHROPLASTY WITH REVISION COMPONENTS Left 02/21/2015   Procedure: TOTAL KNEE ARTHROPLASTY WITH REVISION COMPONENTS;  Surgeon: Frederik Pear, MD;  Location: [facility];  Service: Orthopedics;  Laterality: Left;    Family History  Problem Relation Age of Onset  . Heart disease Mother        CAD, MI  . Hypertension Mother   . Stroke Father   . Aneurysm Father        died from rupture of AAA  . Cancer Paternal Grandfather        prostate    Social History   Socioeconomic History  . Marital status: Married    Spouse name: Not on file  . Number of children: 5  . Years of education: Not on file  . Highest education level: Not on file  Occupational History  . 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 Occupation: Landlord  Tobacco Use  . Smoking status: Never Smoker  . Smokeless tobacco: Never Used  Substance and Sexual Activity  . Alcohol use: Yes    Comment: 2 beers a day in general.  . Drug use: No  . Sexual activity: Not on file  Other Topics Concern  . Not on file  Social History Narrative   2nd marriage.  3 children from first marriage, 2 stepchildren, 2 with current marriage.      Has living will   Wife is health care POA--son Hilliard Clark would be alternate   Would accept resuscitation   Would accept tube feeds briefly but not if cognitively unaware   Social Determinants of Health   Financial Resource Strain:   . Difficulty of Paying Living Expenses:   Food Insecurity:   . Worried About Charity fundraiser in the Last  Year:   . Arboriculturist in the Last Year:   Transportation Needs:   . Film/video editor (Medical):   Marland Kitchen Lack of Transportation (Non-Medical):   Physical Activity:   . Days of Exercise per Week:   . Minutes of Exercise per Session:   Stress:   . Feeling of Stress :   Social Connections:   . Frequency of Communication with Friends and Family:   . Frequency of Social Gatherings with Friends and Family:   . Attends Religious Services:   . Active Member of Clubs or Organizations:   . Attends Archivist Meetings:   Marland Kitchen Marital Status:   Intimate Partner Violence:   . Fear of Current or Ex-Partner:   . Emotionally Abused:   Marland Kitchen Physically Abused:   . Sexually Abused:    Review of Systems No dysuria No urinary frequency No fever Bowels are normal PSA normal in March    Objective:   Physical Exam  Constitutional: He appears well-developed. No distress.  Genitourinary:    Genitourinary Comments: No scrotal swelling  Testes normal No hernia Varicocele on left---slightly tender Prostate normal--no tenderness            Assessment & Plan:

## 2019-06-05 NOTE — Assessment & Plan Note (Signed)
Reassured--nothing worrisome unless symptoms worse (fertility is not an issue) Can try support, heat, NSAIDs

## 2019-07-23 ENCOUNTER — Encounter: Payer: Self-pay | Admitting: Family Medicine

## 2019-07-23 ENCOUNTER — Other Ambulatory Visit: Payer: Self-pay

## 2019-07-23 ENCOUNTER — Ambulatory Visit (INDEPENDENT_AMBULATORY_CARE_PROVIDER_SITE_OTHER): Payer: Medicare Other | Admitting: Family Medicine

## 2019-07-23 VITALS — BP 130/90 | HR 68 | Ht 67.0 in

## 2019-07-23 DIAGNOSIS — M5136 Other intervertebral disc degeneration, lumbar region: Secondary | ICD-10-CM | POA: Diagnosis not present

## 2019-07-23 DIAGNOSIS — M999 Biomechanical lesion, unspecified: Secondary | ICD-10-CM

## 2019-07-23 NOTE — Progress Notes (Signed)
[facility] [place] Phone: [phone] Subjective:   Fontaine No, am serving as a scribe for Dr. Hulan Saas. This visit occurred during the SARS-CoV-2 public health emergency.  Safety protocols were in place, including screening questions prior to the visit, additional usage of staff PPE, and extensive cleaning of exam room while observing appropriate contact time as indicated for disinfecting solutions.   I'm seeing this patient by the request  of:  Brendan Carbon, MD  CC: Back pain follow-up  FOY:[id number]  Brendan Bell is a 69 y.o. male coming in with complaint of back and neck pain. Last seen on 08/20/2017 for hip pain.  Patient states right SI joint pain.  Have seen patient previously for manipulation.  Has having worsening pain again.  Patient does have known arthritic changes of the right hip and states that it is uncomfortable but would not say it is worsening.  Did have a left knee replacement years ago and seems to be doing relatively well.  Medications patient has been prescribed: Nothing at this time except for over-the-counter medications          Reviewed prior external information including notes and imaging from previsou exam, outside providers and external EMR if available.   As well as notes that were available from care everywhere and other healthcare systems.  Past medical history, social, surgical and family history all reviewed in electronic medical record.  No pertanent information unless stated regarding to the chief complaint.   Past Medical History:  Diagnosis Date  . Anxiety   . Depression   . Hyperlipidemia   . Osteoarthritis   . Pneumonia 10/04/98   out patient  . Rhinitis, allergic     No Known Allergies   Review of Systems:  No headache, visual changes, nausea, vomiting, diarrhea, constipation, dizziness, abdominal pain, skin rash, fevers, chills, night sweats, weight  loss, swollen lymph nodes, body aches, joint swelling, chest pain, shortness of breath, mood changes. POSITIVE muscle aches  Objective  Blood pressure 130/90, pulse 68, height 5\' 7"  (1.702 m), SpO2 97 %.   General: No apparent distress alert and oriented x3 mood and affect normal, dressed appropriately.  HEENT: Pupils equal, extraocular movements intact  Respiratory: Patient's speak in full sentences and does not appear short of breath  Cardiovascular: No lower extremity edema, non tender, no erythema  Neuro: Cranial nerves II through XII are intact, neurovascularly intact in all extremities with 2+ DTRs and 2+ pulses.  Gait normal with good balance and coordination.  MSK: Arthritic changes of multiple joints.  Patient does have some degenerative scoliosis noted.  Tightness with FABER test bilaterally.  Severe tightness of the hamstrings bilaterally right greater than left.  Osteopathic findings   T9 extended rotated and side bent left L2 flexed rotated and side bent right Sacrum right on right       Assessment and Plan:    Nonallopathic problems  Decision today to treat with OMT was based on Physical Exam  After verbal consent patient was treated with HVLA, ME, FPR techniques in thoracic, lumbar, and sacral  areas  Patient tolerated the procedure well with improvement in symptoms  Patient given exercises, stretches and lifestyle modifications  See medications in patient instructions if given  Patient will follow up in 4-8 weeks      The above documentation has been reviewed and is accurate and complete Lyndal Pulley, DO  Note: This dictation was prepared with Dragon dictation along with smaller phrase technology. Any transcriptional errors that result from this process are unintentional.

## 2019-07-23 NOTE — Assessment & Plan Note (Signed)
Arthritic changes.  Discussed icing regimen and home exercises.  Chronic problem with mild exacerbation.  We discussed the potential for new x-rays but at this moment we will hold.  Patient will increase activity as tolerated.  May need this more regularly.  Patient wants to continue with the conservative therapy at this time and follow-up with me again as needed

## 2019-07-27 ENCOUNTER — Other Ambulatory Visit: Payer: Self-pay | Admitting: Family Medicine

## 2019-07-27 ENCOUNTER — Ambulatory Visit (INDEPENDENT_AMBULATORY_CARE_PROVIDER_SITE_OTHER): Payer: Medicare Other | Admitting: Family Medicine

## 2019-07-27 ENCOUNTER — Other Ambulatory Visit: Payer: Self-pay

## 2019-07-27 ENCOUNTER — Encounter: Payer: Self-pay | Admitting: Family Medicine

## 2019-07-27 ENCOUNTER — Ambulatory Visit (INDEPENDENT_AMBULATORY_CARE_PROVIDER_SITE_OTHER): Payer: Medicare Other

## 2019-07-27 VITALS — BP 120/80 | HR 70 | Ht 67.0 in | Wt 181.0 lb

## 2019-07-27 DIAGNOSIS — M47816 Spondylosis without myelopathy or radiculopathy, lumbar region: Secondary | ICD-10-CM | POA: Diagnosis not present

## 2019-07-27 DIAGNOSIS — M5416 Radiculopathy, lumbar region: Secondary | ICD-10-CM | POA: Diagnosis not present

## 2019-07-27 DIAGNOSIS — G8929 Other chronic pain: Secondary | ICD-10-CM | POA: Diagnosis not present

## 2019-07-27 DIAGNOSIS — M545 Low back pain, unspecified: Secondary | ICD-10-CM

## 2019-07-27 DIAGNOSIS — Z96641 Presence of right artificial hip joint: Secondary | ICD-10-CM | POA: Diagnosis not present

## 2019-07-27 DIAGNOSIS — M999 Biomechanical lesion, unspecified: Secondary | ICD-10-CM

## 2019-07-27 DIAGNOSIS — M25551 Pain in right hip: Secondary | ICD-10-CM | POA: Diagnosis not present

## 2019-07-27 MED ORDER — PREDNISONE 50 MG PO TABS: ORAL_TABLET | ORAL | 0 refills | Status: DC

## 2019-07-27 MED ORDER — GABAPENTIN 100 MG PO CAPS: 200.0000 mg | ORAL_CAPSULE | Freq: Every day | ORAL | 3 refills | Status: DC

## 2019-07-27 NOTE — Progress Notes (Signed)
[place] [address] [place] Phone: [phone] Subjective:   I [name] am serving as a Education administrator for Dr. Hulan Saas.  This visit occurred during the SARS-CoV-2 public health emergency.  Safety protocols were in place, including screening questions prior to the visit, additional usage of staff PPE, and extensive cleaning of exam room while observing appropriate contact time as indicated for disinfecting solutions.   I'm seeing this patient by the request  of:  [name], MD  CC: Low back pain  [id number]  Brendan Bell is a 69 y.o. male coming in with complaint of SI joint pain. Seen on 07/23/2019 for OMT. Patient states that he did not respond well to OMT last visit. Believes that he is no worse or better.  Patient states that it may have not been quite as generalized but now more specific.  Mild radicular symptoms of the in the back.  This note has been created severe he would state almost ever.    Past Medical History:  Diagnosis Date   Anxiety    Depression    Hyperlipidemia    Osteoarthritis    Pneumonia 10/04/98   out patient   Rhinitis, allergic    Past Surgical History:  Procedure Laterality Date   COLONOSCOPY     EYE SURGERY Bilateral    JOINT REPLACEMENT  2/13///9/13   medial right knee///medial left knee---Dr Jefm Bryant   KNEE ARTHROSCOPY W/ PARTIAL MEDIAL MENISCECTOMY Right 2008   with pateller chondroplasty ([facility])   KNEE SURGERY  1992   right, loose body removed   KNEE SURGERY     multiple surgeries to both knees   MEDIAL PARTIAL KNEE REPLACEMENT Left    Dr. Jefm Bryant   MEDIAL PARTIAL KNEE REPLACEMENT Right    Dr. Jefm Bryant   TONSILLECTOMY     TOTAL HIP ARTHROPLASTY Right 01/08/2018   Procedure: RIGHT TOTAL HIP ARTHROPLASTY ANTERIOR APPROACH;  Surgeon: Gaynelle Arabian, MD;  Location: [facility];  Service: Orthopedics;  Laterality: Right;  161min   TOTAL KNEE ARTHROPLASTY WITH  REVISION COMPONENTS Left 02/21/2015   Procedure: TOTAL KNEE ARTHROPLASTY WITH REVISION COMPONENTS;  Surgeon: Frederik Pear, MD;  Location: [place];  Service: Orthopedics;  Laterality: Left;   Social History   Socioeconomic History   Marital status: Married    Spouse name: Not on file   Number of children: 5   Years of education: Not on file   Highest education level: Not on file  Occupational History   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   Drug use: No   Sexual activity: Not on file  Other Topics Concern   Not on file  Social History Narrative   2nd marriage.  3 children from first marriage, 2 stepchildren, 2 with current marriage.      Has living will   Wife is health care POA--son Hilliard Clark would be alternate   Would accept resuscitation   Would accept tube feeds briefly but not if cognitively unaware   Social Determinants of Health   Financial Resource Strain:    Difficulty of Paying Living Expenses:   Food Insecurity:    Worried About Charity fundraiser  in the Last Year:    Arboriculturist in the Last Year:   Transportation Needs:    Film/video editor (Medical):    Lack of Transportation (Non-Medical):   Physical Activity:    Days of Exercise per Week:    Minutes of Exercise per Session:   Stress:    Feeling of Stress :   Social Connections:    Frequency of Communication with Friends and Family:    Frequency of Social Gatherings with Friends and Family:    Attends Religious Services:    Active Member of Clubs or Organizations:    Attends Music therapist:    Marital Status:    No Known Allergies Family History  Problem Relation Age of Onset   Heart disease Mother         CAD, MI   Hypertension Mother    Stroke Father    Aneurysm Father        died from rupture of AAA   Cancer Paternal Grandfather        prostate    Current Outpatient Medications (Endocrine & Metabolic):    predniSONE (DELTASONE) 50 MG tablet, 1 tablet by mouth daily    Current Outpatient Medications (Analgesics):    acetaminophen (TYLENOL) 650 MG CR tablet, Take 650 mg by mouth every 8 (eight) hours as needed for pain.  Current Outpatient Medications (Hematological):    Cyanocobalamin (B-12 PO), Take by mouth.   Ferrous Sulfate (IRON PO), Take by mouth.  Current Outpatient Medications (Other):    Cholecalciferol (EQL VITAMIN D3) 50 MCG (2000 UT) CAPS, Take by mouth.   DHA-EPA-GLA PO, Take by mouth.   MAGNESIUM PO, Take by mouth.   Multiple Vitamins-Minerals (MULTIVITAMIN PO), Take 1 tablet by mouth daily.   Pyridoxine HCl (B-6 PO), Take by mouth.   ZINC OXIDE PO, Take by mouth.   gabapentin (NEURONTIN) 100 MG capsule, Take 2 capsules (200 mg total) by mouth at bedtime.   Reviewed prior external information including notes and imaging from  primary care provider As well as notes that were available from care everywhere and other healthcare systems.  Past medical history, social, surgical and family history all reviewed in electronic medical record.  No pertanent information unless stated regarding to the chief complaint.   Review of Systems:  No headache, visual changes, nausea, vomiting, diarrhea, constipation, dizziness, abdominal pain, skin rash, fevers, chills, night sweats, weight loss, swollen lymph nodes, body aches, joint swelling, chest pain, shortness of breath, mood changes. POSITIVE muscle aches  Objective  Blood pressure 120/80, pulse 70, height 5\' 7"  (1.702 m), weight 181 lb (82.1 kg), SpO2 97 %.   General: No apparent distress alert and oriented x3 mood and affect normal, dressed appropriately.  HEENT: Pupils equal, extraocular movements  intact  Respiratory: Patient's speak in full sentences and does not appear short of breath  Cardiovascular: No lower extremity edema, non tender, no erythema  Neuro: Cranial nerves II through XII are intact, neurovascularly intact in all extremities with 2+ DTRs and 2+ pulses.  Gait mild antalgic favoring the right hip and leg MSK: Patient's right hip does have some mild tightness with FABER test.  Mild increase in discomfort with straight leg test.  Worsening pain with extension noted. Diffuse tenderness to palpation in the back especially on the right side of the paraspinal musculature.  Osteopathic findings  T9 extended rotated and side bent left L2 flexed rotated and side bent right Sacrum right on right  Impression and Recommendations:   Lumbar radiculopathy, right Patient's pain has evolved into more of a lumbar radiculopathy. Known arthritic changes of the hip that we will need to monitor.  Patient given gabapentin and prednisone that I think will be more beneficial.  Discussed icing regimen and home exercises, which activities to do which wants to avoid.  Patient should increase activity slowly over the course the next several weeks.  Patient will be seen again in 2 to 3 weeks for further evaluation.  Worsening pain I do think advanced imaging is warranted to rule out spinal stenosis and potential epidurals     Decision today to treat with OMT was based on Physical Exam  After verbal consent patient was treated with HVLA, ME, FPR techniques in  thoracic,  lumbar and sacral areas, all areas are chronic   Patient tolerated the procedure well with improvement in symptoms  Patient given exercises, stretches and lifestyle modifications  See medications in patient instructions if given  Patient will follow up in 4-8 weeks   The above documentation has been reviewed and is accurate and complete Lyndal Pulley, DO       Note: This dictation was prepared with Dragon  dictation along with smaller phrase technology. Any transcriptional errors that result from this process are unintentional.

## 2019-07-27 NOTE — Patient Instructions (Signed)
Good to see you Prednisone daily for 5 days Gabapentin Fill muscle relaxer  Xray of back and hip See me again in 2 weeks

## 2019-07-27 NOTE — Assessment & Plan Note (Signed)
Patient's pain has evolved into more of a lumbar radiculopathy. Known arthritic changes of the hip that we will need to monitor.  Patient given gabapentin and prednisone that I think will be more beneficial.  Discussed icing regimen and home exercises, which activities to do which wants to avoid.  Patient should increase activity slowly over the course the next several weeks.  Patient will be seen again in 2 to 3 weeks for further evaluation.  Worsening pain I do think advanced imaging is warranted to rule out spinal stenosis and potential epidurals

## 2019-08-10 ENCOUNTER — Other Ambulatory Visit: Payer: Self-pay

## 2019-08-10 ENCOUNTER — Ambulatory Visit (INDEPENDENT_AMBULATORY_CARE_PROVIDER_SITE_OTHER): Payer: Medicare Other | Admitting: Family Medicine

## 2019-08-10 ENCOUNTER — Encounter: Payer: Self-pay | Admitting: Family Medicine

## 2019-08-10 VITALS — BP 106/82 | HR 67 | Ht 67.0 in | Wt 182.0 lb

## 2019-08-10 DIAGNOSIS — M999 Biomechanical lesion, unspecified: Secondary | ICD-10-CM | POA: Insufficient documentation

## 2019-08-10 DIAGNOSIS — M545 Low back pain: Secondary | ICD-10-CM | POA: Diagnosis not present

## 2019-08-10 DIAGNOSIS — M5416 Radiculopathy, lumbar region: Secondary | ICD-10-CM | POA: Diagnosis not present

## 2019-08-10 DIAGNOSIS — G8929 Other chronic pain: Secondary | ICD-10-CM

## 2019-08-10 NOTE — Progress Notes (Signed)
[facility] [address] [place] Phone: [phone] Subjective:   I Brendan Bell am serving as a Education administrator for Dr. Hulan Bell.  This visit occurred during the SARS-CoV-2 public health emergency.  Safety protocols were in place, including screening questions prior to the visit, additional usage of staff PPE, and extensive cleaning of exam room while observing appropriate contact time as indicated for disinfecting solutions.   I'm seeing this patient by the request  of:  Brendan Carbon, MD  CC: Low back pain follow-up  [id number]   07/27/2019 Patient's pain has evolved into more of a lumbar radiculopathy. Known arthritic changes of the hip that we will need to monitor.  Patient given gabapentin and prednisone that I think will be more beneficial.  Discussed icing regimen and home exercises, which activities to do which wants to avoid.  Patient should increase activity slowly over the course the next several weeks.  Patient will be seen again in 2 to 3 weeks for further evaluation.  Worsening pain I do think advanced imaging is warranted to rule out spinal stenosis and potential epidurals  08/10/2019 Brendan Bell is a 69 y.o. male coming in with complaint of back pain. Patient states his back was improving. States he has an achy pain now. States he continues to restrict what he does.  Patient states that unfortunately seems to have some very mild weakness that seems to be intermittent in the legs.  Patient states that it is something that he could potentially deal with but that would like to know what it is so he knows his choices.  States that he is still affecting daily activities on a fairly regular basis.     Past Medical History:  Diagnosis Date  . Anxiety   . Depression   . Hyperlipidemia   . Osteoarthritis   . Pneumonia 10/04/98   out patient  . Rhinitis, allergic    Past Surgical History:  Procedure Laterality Date  .  COLONOSCOPY    . EYE SURGERY Bilateral   . JOINT REPLACEMENT  2/13///9/13   medial right knee///medial left knee---Dr Jefm Bryant  . KNEE ARTHROSCOPY W/ PARTIAL MEDIAL MENISCECTOMY Right 2008   with pateller chondroplasty ([facility])  . KNEE SURGERY  1992   right, loose body removed  . KNEE SURGERY     multiple surgeries to both knees  . MEDIAL PARTIAL KNEE REPLACEMENT Left    Dr. Jefm Bryant  . MEDIAL PARTIAL KNEE REPLACEMENT Right    Dr. Jefm Bryant  . TONSILLECTOMY    . TOTAL HIP ARTHROPLASTY Right 01/08/2018   Procedure: RIGHT TOTAL HIP ARTHROPLASTY ANTERIOR APPROACH;  Surgeon: Gaynelle Arabian, MD;  Location: [facility];  Service: Orthopedics;  Laterality: Right;  147min  . TOTAL KNEE ARTHROPLASTY WITH REVISION COMPONENTS Left 02/21/2015   Procedure: TOTAL KNEE ARTHROPLASTY WITH REVISION COMPONENTS;  Surgeon: Frederik Pear, MD;  Location: [place];  Service: Orthopedics;  Laterality: Left;   Social History   Socioeconomic History  . Marital status: Married    Spouse name: Not on file  . Number of children: 5  . Years of education: Not on file  . Highest education level: Not on file  Occupational History  . 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 Occupation: Landlord  Tobacco Use  . Smoking status: Never Smoker  . Smokeless tobacco: Never Used  Vaping Use  . Vaping Use: Never used  Substance and Sexual Activity  . Alcohol use:  Yes    Comment: 2 beers a day in general.  . Drug use: No  . Sexual activity: Not on file  Other Topics Concern  . Not on file  Social History Narrative   2nd marriage.  3 children from first marriage, 2 stepchildren, 2 with current marriage.      Has living will   Wife is health care POA--son Hilliard Clark would be alternate   Would accept resuscitation   Would accept tube feeds briefly but not if cognitively unaware   Social Determinants of Health   Financial Resource Strain:   . Difficulty of Paying Living Expenses:   Food Insecurity:     . Worried About Charity fundraiser in the Last Year:   . Arboriculturist in the Last Year:   Transportation Needs:   . Film/video editor (Medical):   Marland Kitchen Lack of Transportation (Non-Medical):   Physical Activity:   . Days of Exercise per Week:   . Minutes of Exercise per Session:   Stress:   . Feeling of Stress :   Social Connections:   . Frequency of Communication with Friends and Family:   . Frequency of Social Gatherings with Friends and Family:   . Attends Religious Services:   . Active Member of Clubs or Organizations:   . Attends Archivist Meetings:   Marland Kitchen Marital Status:    No Known Allergies Family History  Problem Relation Age of Onset  . Heart disease Mother        CAD, MI  . Hypertension Mother   . Stroke Father   . Aneurysm Father        died from rupture of AAA  . Cancer Paternal Grandfather        prostate    Current Outpatient Medications (Endocrine & Metabolic):  .  predniSONE (DELTASONE) 50 MG tablet, 1 tablet by mouth daily    Current Outpatient Medications (Analgesics):  .  acetaminophen (TYLENOL) 650 MG CR tablet, Take 650 mg by mouth every 8 (eight) hours as needed for pain.  Current Outpatient Medications (Hematological):  Marland Kitchen  Cyanocobalamin (B-12 PO), Take by mouth. .  Ferrous Sulfate (IRON PO), Take by mouth.  Current Outpatient Medications (Other):  Marland Kitchen  Cholecalciferol (EQL VITAMIN D3) 50 MCG (2000 UT) CAPS, Take by mouth. .  DHA-EPA-GLA PO, Take by mouth. .  gabapentin (NEURONTIN) 100 MG capsule, Take 2 capsules (200 mg total) by mouth at bedtime. Marland Kitchen  MAGNESIUM PO, Take by mouth. .  Multiple Vitamins-Minerals (MULTIVITAMIN PO), Take 1 tablet by mouth daily. .  Pyridoxine HCl (B-6 PO), Take by mouth. Marland Kitchen  ZINC OXIDE PO, Take by mouth.   Reviewed prior external information including notes and imaging from  primary care provider As well as notes that were available from care everywhere and other healthcare systems.  Past medical  history, social, surgical and family history all reviewed in electronic medical record.  No pertanent information unless stated regarding to the chief complaint.   Review of Systems:  No headache, visual changes, nausea, vomiting, diarrhea, constipation, dizziness, abdominal pain, skin rash, fevers, chills, night sweats, weight loss, swollen lymph nodes, body aches, joint swelling, chest pain, shortness of breath, mood changes. POSITIVE muscle aches  Objective  Blood pressure 106/82, pulse 67, height 5\' 7"  (1.702 m), weight 182 lb (82.6 kg), SpO2 96 %.   General: No apparent distress alert and oriented x3 mood and affect normal, dressed appropriately.  HEENT: Pupils equal, extraocular movements  intact  Respiratory: Patient's speak in full sentences and does not appear short of breath  Cardiovascular: No lower extremity edema, non tender, no erythema  Neuro: Cranial nerves II through XII are intact, neurovascularly intact in all extremities with 2+ DTRs and 2+ pulses.  Gait antalgic favoring patient's back at the moment.  Patient does have some tightness noted with the straight leg test.  Mild radicular symptoms.  Patient does have a hip replacement noted on the right side.  Patient's back is tender to palpation diffusely more in the L3-L4 area.  Radicular symptoms also consistent with the L4 nerve root.  Osteopathic findings T11 extended rotated and side bent left L3 flexed rotated and side bent right Sacrum right on right     Impression and Recommendations:     The above documentation has been reviewed and is accurate and complete Lyndal Pulley, DO       Note: This dictation was prepared with Dragon dictation along with smaller phrase technology. Any transcriptional errors that result from this process are unintentional.

## 2019-08-10 NOTE — Assessment & Plan Note (Signed)
Continued radiculopathy.  Patient has a positive straight leg test.  Not responding to the conservative therapy including the gabapentin.  MRI ordered today.  Patient will have the MRI and could be a candidate for nerve root or epidural injections.  Patient will of course want to avoid any type of surgical intervention.  Attempted soft tissue and muscle energy manipulation today with minimal success

## 2019-08-10 NOTE — Patient Instructions (Signed)
MRI lumbar We will call with results

## 2019-08-18 ENCOUNTER — Ambulatory Visit
Admission: RE | Admit: 2019-08-18 | Discharge: 2019-08-18 | Disposition: A | Discharge status: Home / Self Care | Payer: Medicare Other | Source: Ambulatory Visit | Attending: Family Medicine | Admitting: Family Medicine

## 2019-08-18 DIAGNOSIS — G8929 Other chronic pain: Secondary | ICD-10-CM

## 2019-08-18 DIAGNOSIS — M48061 Spinal stenosis, lumbar region without neurogenic claudication: Secondary | ICD-10-CM | POA: Diagnosis not present

## 2019-08-18 DIAGNOSIS — M545 Low back pain, unspecified: Secondary | ICD-10-CM

## 2019-08-21 ENCOUNTER — Telehealth: Payer: Self-pay | Admitting: Family Medicine

## 2019-08-21 NOTE — Telephone Encounter (Signed)
Pt calling for MRI results

## 2019-08-24 NOTE — Telephone Encounter (Signed)
Attempted to call patient. Voicemail box not set up.

## 2019-08-24 NOTE — Telephone Encounter (Signed)
Spoke with patient and set him up for virtual visit.

## 2019-08-25 ENCOUNTER — Other Ambulatory Visit: Payer: Medicare Other

## 2019-08-28 ENCOUNTER — Ambulatory Visit (INDEPENDENT_AMBULATORY_CARE_PROVIDER_SITE_OTHER): Payer: Medicare Other | Admitting: Family Medicine

## 2019-08-28 ENCOUNTER — Encounter: Payer: Self-pay | Admitting: Family Medicine

## 2019-08-28 DIAGNOSIS — M5416 Radiculopathy, lumbar region: Secondary | ICD-10-CM

## 2019-08-28 DIAGNOSIS — M5136 Other intervertebral disc degeneration, lumbar region: Secondary | ICD-10-CM

## 2019-08-28 NOTE — Progress Notes (Signed)
Virtual Visit via Video Note  I connected with Luanna Cole on 08/28/19 at 11:45 AM EDT by a video enabled telemedicine application and verified that I am speaking with the correct person using two identifiers.  Location: Patient: in home setting alone  Provider: In office setting   I discussed the limitations of evaluation and management by telemedicine and the availability of in person appointments. The patient expressed understanding and agreed to proceed.  History of Present Illness:  Patient is a 69 year old gentleman was having chronic back pain.  Having radicular symptoms are worsening.  Finding well to conservative therapy.  Sent for MRI.  Independently visualized by me.  IMPRESSION: 1. Right lateral disc protrusion/extrusion at L1-2 extends superiorly in the right lateral recess and into the right foramen likely impacts the right L1 and possibly L2 nerve roots. 2. 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 This is the most significant central canal stenosis. 3. Moderate central canal stenosis at L2-3  4. Mild central canal narrowing and bilateral foraminal narrowing at L3-4. 5. Mild subarticular and foraminal narrowing bilaterally at L5-S1 is worse on the left.    Observations/Objective: Alert and oriented x3, difficulty with virtual platform so did the rest on phone call.   Assessment and Plan: 69 year old gentleman with spinal stenosis.  I do believe the patient would be a candidate for epidural.  Discussed the alternatives of different medications with the possibility of surgical intervention.  Patient has elected to do the epidural.   Follow Up Instructions: Follow-up 3 to 4 weeks after epidural to discuss and consider the possibility of restarting manipulation or if no improvement surgical intervention    I discussed the assessment and treatment plan with the patient. The patient was provided an opportunity to ask  questions and all were answered. The patient agreed with the plan and demonstrated an understanding of the instructions.   The patient was advised to call back or seek an in-person evaluation if the symptoms worsen or if the condition fails to improve as anticipated.  I provided 15 minutes of face-to-face time during this encounter.   Lyndal Pulley, DO

## 2019-09-03 ENCOUNTER — Other Ambulatory Visit: Payer: Self-pay

## 2019-09-03 ENCOUNTER — Ambulatory Visit
Admission: RE | Admit: 2019-09-03 | Discharge: 2019-09-03 | Disposition: A | Discharge status: Home / Self Care | Payer: Medicare Other | Source: Ambulatory Visit | Attending: Family Medicine | Admitting: Family Medicine

## 2019-09-03 DIAGNOSIS — M5416 Radiculopathy, lumbar region: Secondary | ICD-10-CM

## 2019-09-03 DIAGNOSIS — M48061 Spinal stenosis, lumbar region without neurogenic claudication: Secondary | ICD-10-CM | POA: Diagnosis not present

## 2019-09-03 MED ORDER — METHYLPREDNISOLONE ACETATE 40 MG/ML INJ SUSP (RADIOLOG
120.0000 mg | Freq: Once | INTRAMUSCULAR | Status: AC
  Administered 2019-09-03: 120 mg via EPIDURAL

## 2019-09-03 MED ORDER — IOPAMIDOL (ISOVUE-M 200) INJECTION 41%
1.0000 mL | Freq: Once | INTRAMUSCULAR | Status: AC
  Administered 2019-09-03: 1 mL via EPIDURAL

## 2019-09-03 NOTE — Discharge Instructions (Signed)

## 2019-09-04 ENCOUNTER — Other Ambulatory Visit: Payer: Medicare Other

## 2019-09-08 ENCOUNTER — Other Ambulatory Visit: Payer: Self-pay

## 2019-09-08 ENCOUNTER — Ambulatory Visit
Admission: RE | Admit: 2019-09-08 | Discharge: 2019-09-08 | Disposition: A | Discharge status: Home / Self Care | Payer: Medicare Other | Source: Ambulatory Visit | Attending: Family Medicine | Admitting: Family Medicine

## 2019-09-08 ENCOUNTER — Telehealth: Payer: Self-pay | Admitting: *Deleted

## 2019-09-08 ENCOUNTER — Other Ambulatory Visit: Payer: Self-pay | Admitting: Family Medicine

## 2019-09-08 DIAGNOSIS — R202 Paresthesia of skin: Secondary | ICD-10-CM | POA: Diagnosis not present

## 2019-09-08 DIAGNOSIS — Z01818 Encounter for other preprocedural examination: Secondary | ICD-10-CM | POA: Diagnosis not present

## 2019-09-08 DIAGNOSIS — R200 Anesthesia of skin: Secondary | ICD-10-CM | POA: Insufficient documentation

## 2019-09-08 LAB — POCT I-STAT CREATININE: Creatinine, Ser: 1.5 mg/dL — ABNORMAL HIGH (ref 0.61–1.24)

## 2019-09-08 MED ORDER — IOHEXOL 300 MG/ML  SOLN
60.0000 mL | Freq: Once | INTRAMUSCULAR | Status: AC | PRN
  Administered 2019-09-08: 60 mL via INTRAVENOUS

## 2019-09-08 NOTE — Telephone Encounter (Signed)
Patient called to schedule an appointment and was transferred to triage because of symptoms. Patient stated that his middle finger and index finger on his left hand went numb. Patient stated after a while his left hand went numb up to his wrist. Patient stated that the left side of his lip and tongue went numb. Patient stated that this lasted for about 15 minutes. Patient denies any chest pain or headache. Patient stated that the numbness is about 95% gone at this point. Patient stated while on the phone and holding it in his right hand this hand is tingling now.  After speaking with Dr. Silvio Pate patient was advised that he needs to go to the ER to be evaluated with these symptoms. Advised patient that he will probably need some scans done. Patient stated that he agrees that he needs to go to an ER but not sure which one he will go to. Patient stated that he will think about where he wants to go and will call back with this information.

## 2019-09-08 NOTE — Telephone Encounter (Signed)
Patient called to let Dr. Silvio Pate know that he saw Dr. Deloria Lair at [facility]. Patient stated that she did a neurological test on him and that was fine. Patient stated that he is scheduled to go to [facility] today at 3:30 pm for a CT scan with and without contrast. Patient stated that he is hoping that Dr. Silvio Pate will be able to see the results since it is a [facility] facility.

## 2019-09-08 NOTE — Telephone Encounter (Signed)
Patient called to let Dr. Silvio Pate know that he went to [facility] and they were overwhelmed with patients. Patient stated that he is now at the walk-in clinic at [facility]. Patient stated that now the numbness has gone to his right hand.

## 2019-09-09 NOTE — Telephone Encounter (Signed)
Spoke to pt's wife per DPR. ?

## 2019-09-09 NOTE — Telephone Encounter (Signed)
Let him know that I am glad there was nothing concerning on his exam. We may want to check his carotid arteries---but the CT scan (or an MRI) is the proper starting point. I should be able to see those results when they are done

## 2019-09-10 DIAGNOSIS — K640 First degree hemorrhoids: Secondary | ICD-10-CM | POA: Diagnosis not present

## 2019-09-10 DIAGNOSIS — Z1211 Encounter for screening for malignant neoplasm of colon: Secondary | ICD-10-CM | POA: Diagnosis not present

## 2019-09-10 LAB — HM COLONOSCOPY

## 2019-10-23 ENCOUNTER — Telehealth: Payer: Self-pay

## 2019-10-23 NOTE — Telephone Encounter (Signed)
I spoke with pt; and he said on 09/08/19 had CT of head that was negative and was seen at [facility]; pt said numbness move from one side of face to the other and from one hand to the other. Pt said since then has had transcient sensation. Pt has interm tingling in face and hands for 6 weeks but getting worse. Yesterday pt had some confusion and for 73' or so pt knew what he wanted to say but could not get the words out. Pt was able to collect his thoughts later and express himself; today pt has brain fog. Pt does not want to go to ED or UC and request appt next wk with Dr Silvio Pate. Dr Silvio Pate said he can work pt in on 10/26/19 at 1:30 but Dr Silvio Pate thinks pt should get evaluated today due to concern of pending stroke. Pt said out of respect for Dr Silvio Pate he will contact [facility] to see if can see Dr Deloria Lair. If pt cannot be seen at [facility] will cb for appt on 10/26/19 with Dr Silvio Pate. FYI to DR Silvio Pate.

## 2019-10-23 NOTE — Telephone Encounter (Signed)
Access nurse, Pam, contacted office that pt was transferred to her to be triaged for tingling in hand and face and brain fog. She gave a disposition of ER and pt did not want to go to the ER. Pt wanted to see PCP in office today. She explained he should go to ER. Pt hung up. This nurse talked to [facility] and she explained.  This nurse handed information to triage nurse to f/u with pt for further triage.

## 2019-10-23 NOTE — Telephone Encounter (Addendum)
I spoke with pts wife;(DPR signed) and she said pt retook BP which was OK and pt decided he was not going to UC unless symptoms worsened. pts wife notified of Dr Alla German comment at 12:36 and Mrs Perea voiced understanding and was appreciative of comment but wants appt for Monday with Dr Silvio Pate.appt scheduled 10/26/19 at 1:30 and pt will ck in at 1:15 with again advising needs to be seen today but if worsens over weekend to got to [facility] or ED. Mrs Knupp voiced understanding. FYI to Dr Silvio Pate. Pt has no covid symptoms, no travel and no known exposure to + covid. Pt did travel to [facility] on 10/17/19 - 10/18/19 due to pts mother in law being in poor health. Per pts wife pt was only around family and did not wear a mask or social distance. Pt had Moderna vaccine 03/17/19 and 04/14/19.

## 2019-10-23 NOTE — Telephone Encounter (Signed)
Okay I expect if he worsens, he will go to ER anyway----but it would be better for him not to wait for Monday even if nothing else happens

## 2019-10-23 NOTE — Telephone Encounter (Signed)
[facility] - Client TELEPHONE ADVICE RECORD [facility] Patient Name: [facility] Gender: Male DOB: 1950/09/12 Age: 69 Y 23 M Return Phone Number: [phone] (Primary), [phone] (Secondary) Address[place] [address] Client [facility] [place] [facility] - Client Client Site [facility] [facility] Physician [name]- MD Contact Type Call Who Is Calling Patient / Member / Family / Caregiver Call Type Triage / Clinical Relationship To Patient Self Return Phone Number [phone] (Primary) Chief Complaint Tingling Reason for Call Symptomatic / Request for [place] states he has confusion and tingling face. Additional Comment confusion- Not on set Translation No Nurse Assessment Nurse: Clovis Riley, RN, Georgina Peer Date/Time (Eastern Time): 10/23/2019 8:51:12 AM Confirm and document reason for call. If symptomatic, describe symptoms. ---Caller states he was having difficulty speaking yesterday for about 69min. States he has been having symptoms for about 6 weeks brain fog, tingling in the face and hands. He had a brain scan on aug 10 and it was negative Does the patient have any new or worsening symptoms? ---Yes Will a triage be completed? ---Yes Related visit to physician within the last 2 weeks? ---No Does the PT have any chronic conditions? (i.e. diabetes, asthma, this includes High risk factors for pregnancy, etc.) ---No Is this a behavioral health or substance abuse call? ---No Nurse: Windle Guard, RN, Olin Hauser Date/Time (Eastern Time): 10/23/2019 9:05:16 AM Confirm and document reason for call. If symptomatic, describe symptoms. ---Caller states he had confusion yesterday afternoon for about 10 mins, and has had intermittent tingling in his face for about 6 weeks. States he has "brain fog" today. Does the patient have any new or worsening symptoms? ---Yes Will a triage be  completed? ---Yes Related visit to physician within the last 2 weeks? ---No Does the PT have any chronic conditions? (i.e. diabetes, asthma, this includes High risk factors for pregnancy, etc.) ---No Is this a behavioral health or substance abuse call? ---No PLEASE NOTE: All timestamps contained within this report are represented as [place] Standard Time. CONFIDENTIALTY NOTICE: This fax transmission is intended only for the addressee. It contains information that is legally privileged, confidential or otherwise protected from use or disclosure. If you are not the intended recipient, you are strictly prohibited from reviewing, disclosing, copying using or disseminating any of this information or taking any action in reliance on or regarding this information. If you have received this fax in error, please notify us immediately by telephone so that we can arrange for its return to [place]. Phone: [phone], Toll-Free: [phone], Fax: [phone] Page: 2 of 2 Call Id: [id number] Guidelines Guideline Title Affirmed Question Affirmed Notes Nurse Date/Time Eilene Ghazi Time) Neurologic Deficit [1] Numbness (i.e., loss of sensation) of the face, arm / hand, or leg / foot on one side of the body AND [2] sudden onset AND [3] brief (now gone) Actor, RN, Olin Hauser 10/23/2019 9:06:57 AM Disp. Time Eilene Ghazi Time) Disposition Final User 10/23/2019 9:01:28 AM Send To RN Personal Clovis Riley, RN, Georgina Peer 10/23/2019 9:09:43 AM Go to ED Now (or PCP triage) Yes Windle Guard, RN, Otho Najjar Disagree/Comply Disagree Caller Understands Yes PreDisposition Call Doctor Care Advice Given Per Guideline GO TO ED NOW (OR PCP TRIAGE): ANOTHER ADULT SHOULD DRIVE: * It is better and safer if another adult drives instead of you. CARE ADVICE given per Neurologic Deficit (Adult) guideline. Comments User: Harvin Hazel, RN Date/Time Eilene Ghazi Time): 10/23/2019 9:19:39 AM Caller disconnected while nurse speaking with office to see if any  appointments avail. User: Harvin Hazel, RN Date/Time Eilene Ghazi Time): 10/23/2019 9:21:20 AM Spoke with lead nurse Larene Beach at office, will contact caller Referrals [place] REFUSED REFERRED TO PCP OFFICE

## 2019-10-23 NOTE — Telephone Encounter (Signed)
Already spoke with pt; see below note.

## 2019-10-25 NOTE — Telephone Encounter (Signed)
Okay Hopefully nothing else will happen and I can just see him on Monday

## 2019-10-26 ENCOUNTER — Other Ambulatory Visit: Payer: Self-pay

## 2019-10-26 ENCOUNTER — Ambulatory Visit (INDEPENDENT_AMBULATORY_CARE_PROVIDER_SITE_OTHER): Payer: Medicare Other | Admitting: Internal Medicine

## 2019-10-26 ENCOUNTER — Encounter: Payer: Self-pay | Admitting: Internal Medicine

## 2019-10-26 VITALS — BP 116/82 | HR 87 | Temp 98.1°F | Ht 67.0 in | Wt 177.0 lb

## 2019-10-26 DIAGNOSIS — R200 Anesthesia of skin: Secondary | ICD-10-CM

## 2019-10-26 DIAGNOSIS — G459 Transient cerebral ischemic attack, unspecified: Secondary | ICD-10-CM

## 2019-10-26 DIAGNOSIS — Z8673 Personal history of transient ischemic attack (TIA), and cerebral infarction without residual deficits: Secondary | ICD-10-CM | POA: Insufficient documentation

## 2019-10-26 NOTE — Progress Notes (Signed)
Subjective:    Patient ID: [facility], male    DOB: [birth year], 69 y.o.   MRN: [id number]  HPI Here due to "brain fog" and brief aphasia last week This visit occurred during the SARS-CoV-2 public health emergency.  Safety protocols were in place, including screening questions prior to the visit, additional usage of staff PPE, and extensive cleaning of exam room while observing appropriate contact time as indicated for disinfecting solutions.   Has had past numbness in hands and face---first 8/10 Started in left middle finger--then the index and finally 4th and 5th fingers. Went up past wrist to id forearm---then dissapated Then noticed numbness in left bottom lip---to center of tongue and roof of mouth (as the hand was improving) Then mouth started to resolve---then tips of right fingers tingled and right side of mouth as well (no longer than 5 minutes) Total time ~20 minutes Was seen at [facility] urgent care---head CT negative  Did remember numbness in left lip for 30 seconds while driving some weeks before that. Also had sensation on lateral lower left calf before this  ~2 weeks ago[place]right lip started tingling and then some on palate and around right eye Gone in about 15 minutes---but not quite normal for 2 hours  Then 4 days ago (9/23), was in car and talking to wife----and he couldn't get words out (lasted about 20 minutes) "I couldn't formulate the words"--he called it "brain fog"  Never had weakness  No new symptoms  Current Outpatient Medications on File Prior to Visit  Medication Sig Dispense Refill  . acetaminophen (TYLENOL) 650 MG CR tablet Take 650 mg by mouth every 8 (eight) hours as needed for pain.    . Cholecalciferol (EQL VITAMIN D3) 50 MCG (2000 UT) CAPS Take by mouth.    . Cyanocobalamin (B-12 PO) Take by mouth.    . DHA-EPA-GLA PO Take by mouth.    . Ferrous Sulfate (IRON PO) Take by mouth.    Marland Kitchen MAGNESIUM PO Take by mouth.    .  Multiple Vitamins-Minerals (MULTIVITAMIN PO) Take 1 tablet by mouth daily.    . Pyridoxine HCl (B-6 PO) Take by mouth.    Marland Kitchen ZINC OXIDE PO Take by mouth.     No current facility-administered medications on file prior to visit.    No Known Allergies  Past Medical History:  Diagnosis Date  . Anxiety   . Depression   . Hyperlipidemia   . Osteoarthritis   . Pneumonia 10/04/98   out patient  . Rhinitis, allergic     Past Surgical History:  Procedure Laterality Date  . COLONOSCOPY    . EYE SURGERY Bilateral   . JOINT REPLACEMENT  2/13///9/13   medial right knee///medial left knee---Dr Jefm Bryant  . KNEE ARTHROSCOPY W/ PARTIAL MEDIAL MENISCECTOMY Right 2008   with pateller chondroplasty ([facility])  . KNEE SURGERY  1992   right, loose body removed  . KNEE SURGERY     multiple surgeries to both knees  . MEDIAL PARTIAL KNEE REPLACEMENT Left    Dr. Jefm Bryant  . MEDIAL PARTIAL KNEE REPLACEMENT Right    Dr. Jefm Bryant  . TONSILLECTOMY    . TOTAL HIP ARTHROPLASTY Right 01/08/2018   Procedure: RIGHT TOTAL HIP ARTHROPLASTY ANTERIOR APPROACH;  Surgeon: Gaynelle Arabian, MD;  Location: [facility];  Service: Orthopedics;  Laterality: Right;  141min  . TOTAL KNEE ARTHROPLASTY WITH REVISION COMPONENTS Left 02/21/2015   Procedure: TOTAL KNEE ARTHROPLASTY WITH REVISION COMPONENTS;  Surgeon: Frederik Pear, MD;  Location: [facility];  Service: Orthopedics;  Laterality: Left;    Family History  Problem Relation Age of Onset  . Heart disease Mother        CAD, MI  . Hypertension Mother   . Stroke Father   . Aneurysm Father        died from rupture of AAA  . Cancer Paternal Grandfather        prostate    Social History   Socioeconomic History  . Marital status: Married    Spouse name: Not on file  . Number of children: 5  . Years of education: Not on file  . Highest education level: Not on file  Occupational History  . 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 Occupation:  Landlord  Tobacco Use  . Smoking status: Never Smoker  . Smokeless tobacco: Never Used  Vaping Use  . Vaping Use: Never used  Substance and Sexual Activity  . Alcohol use: Yes    Comment: 2 beers a day in general.  . Drug use: No  . Sexual activity: Not on file  Other Topics Concern  . Not on file  Social History Narrative   2nd marriage.  3 children from first marriage, 2 stepchildren, 2 with current marriage.      Has living will   Wife is health care POA--son Hilliard Clark would be alternate   Would accept resuscitation   Would accept tube feeds briefly but not if cognitively unaware   Social Determinants of Health   Financial Resource Strain:   . Difficulty of Paying Living Expenses: Not on file  Food Insecurity:   . Worried About Charity fundraiser in the Last Year: Not on file  . Ran Out of Food in the Last Year: Not on file  Transportation Needs:   . Lack of Transportation (Medical): Not on file  . Lack of Transportation (Non-Medical): Not on file  Physical Activity:   . Days of Exercise per Week: Not on file  . Minutes of Exercise per Session: Not on file  Stress:   . Feeling of Stress : Not on file  Social Connections:   . Frequency of Communication with Friends and Family: Not on file  . Frequency of Social Gatherings with Friends and Family: Not on file  . Attends Religious Services: Not on file  . Active Member of Clubs or Organizations: Not on file  . Attends Archivist Meetings: Not on file  . Marital Status: Not on file  Intimate Partner Violence:   . Fear of Current or Ex-Partner: Not on file  . Emotionally Abused: Not on file  . Physically Abused: Not on file  . Sexually Abused: Not on file   Review of Systems Started getting nervous after this last spell Not sleeping as well for last 3 nights Some nerves---"I'm watching my breath" No change in function No chest pain or palpitations No dizziness Did get SOB once mowing friend's lawn 4 nights  ago-had to stop and leave     Objective:   Physical Exam Constitutional:      Appearance: Normal appearance.  Cardiovascular:     Rate and Rhythm: Normal rate and regular rhythm.     Pulses: Normal pulses.     Heart sounds: No murmur heard.  No gallop.   Pulmonary:     Effort: Pulmonary effort is normal.     Breath sounds: Normal breath sounds. No wheezing or rales.  Musculoskeletal:  Cervical back: Neck supple.     Right lower leg: No edema.     Left lower leg: No edema.  Lymphadenopathy:     Cervical: No cervical adenopathy.  Neurological:     Mental Status: He is alert.     Cranial Nerves: Cranial nerves are intact.     Sensory: Sensation is intact.     Motor: Motor function is intact. No weakness, tremor, atrophy or abnormal muscle tone.     Coordination: Coordination is intact. Romberg sign negative. Coordination normal.     Gait: Gait is intact.            Assessment & Plan:

## 2019-10-26 NOTE — Assessment & Plan Note (Addendum)
Fairly classic spell of aphasia 4 days ago Other symptoms may be neurologic but don't follow a specific neurologic region Unlikely to be atrial fib due to the pattern of neurologic changes--but may need to consider cardiac monitor There is concern for MS or some other neurologic syndrome--but very unlikely Will check brain MRI and carotid tests

## 2019-10-26 NOTE — Assessment & Plan Note (Signed)
Sensory changes in fingers and lips/mouth in moving fashion Not classic for sensory stroke---especially since bilateral Will check MRI due to the aphasia Needs blood work

## 2019-10-27 LAB — COMPREHENSIVE METABOLIC PANEL
ALT: 19 U/L (ref 0–53)
AST: 22 U/L (ref 0–37)
Albumin: 4.3 g/dL (ref 3.5–5.2)
Alkaline Phosphatase: 51 U/L (ref 39–117)
BUN: 24 mg/dL — ABNORMAL HIGH (ref 6–23)
CO2: 25 mEq/L (ref 19–32)
Calcium: 9.5 mg/dL (ref 8.4–10.5)
Chloride: 107 mEq/L (ref 96–112)
Creatinine, Ser: 1.44 mg/dL (ref 0.40–1.50)
GFR: 48.92 mL/min — ABNORMAL LOW (ref >60.00)
Glucose, Bld: 94 mg/dL (ref 70–99)
Potassium: 4.1 mEq/L (ref 3.5–5.1)
Sodium: 140 mEq/L (ref 135–145)
Total Bilirubin: 1 mg/dL (ref 0.2–1.2)
Total Protein: 6.8 g/dL (ref 6.0–8.3)

## 2019-10-27 LAB — T4, FREE: Free T4: 1.13 ng/dL (ref 0.60–1.60)

## 2019-10-27 LAB — CBC
HCT: 50.3 % (ref 39.0–52.0)
Hemoglobin: 16.8 g/dL (ref 13.0–17.0)
MCHC: 33.3 g/dL (ref 30.0–36.0)
MCV: 90 fl (ref 78.0–100.0)
Platelets: 179 10*3/uL (ref 150.0–400.0)
RBC: 5.59 Mil/uL (ref 4.22–5.81)
RDW: 14.3 % (ref 11.5–15.5)
WBC: 8.4 10*3/uL (ref 4.0–10.5)

## 2019-10-27 LAB — LIPID PANEL
Cholesterol: 195 mg/dL (ref 0–200)
HDL: 54.4 mg/dL (ref >39.00)
LDL Cholesterol: 123 mg/dL — ABNORMAL HIGH (ref 0–99)
NonHDL: 140.56
Total CHOL/HDL Ratio: 4
Triglycerides: 86 mg/dL (ref 0.0–149.0)
VLDL: 17.2 mg/dL (ref 0.0–40.0)

## 2019-10-27 LAB — SEDIMENTATION RATE: Sed Rate: 10 mm/hr (ref 0–20)

## 2019-10-27 LAB — VITAMIN B12: Vitamin B-12: 265 pg/mL (ref 211–911)

## 2019-10-28 ENCOUNTER — Ambulatory Visit (INDEPENDENT_AMBULATORY_CARE_PROVIDER_SITE_OTHER): Payer: Medicare Other

## 2019-10-28 ENCOUNTER — Other Ambulatory Visit: Payer: Self-pay

## 2019-10-28 DIAGNOSIS — G459 Transient cerebral ischemic attack, unspecified: Secondary | ICD-10-CM | POA: Diagnosis not present

## 2019-11-03 ENCOUNTER — Other Ambulatory Visit: Payer: Self-pay

## 2019-11-03 ENCOUNTER — Ambulatory Visit
Admission: RE | Admit: 2019-11-03 | Discharge: 2019-11-03 | Disposition: A | Discharge status: Home / Self Care | Payer: Medicare Other | Source: Ambulatory Visit | Attending: Internal Medicine | Admitting: Internal Medicine

## 2019-11-03 DIAGNOSIS — G459 Transient cerebral ischemic attack, unspecified: Secondary | ICD-10-CM | POA: Diagnosis not present

## 2019-11-18 ENCOUNTER — Other Ambulatory Visit: Payer: Self-pay

## 2019-11-18 ENCOUNTER — Ambulatory Visit (INDEPENDENT_AMBULATORY_CARE_PROVIDER_SITE_OTHER): Payer: Medicare Other | Admitting: Internal Medicine

## 2019-11-18 ENCOUNTER — Encounter: Payer: Self-pay | Admitting: Internal Medicine

## 2019-11-18 VITALS — BP 124/88 | HR 68 | Temp 97.4°F | Ht 67.0 in | Wt 174.0 lb

## 2019-11-18 DIAGNOSIS — Z8673 Personal history of transient ischemic attack (TIA), and cerebral infarction without residual deficits: Secondary | ICD-10-CM | POA: Diagnosis not present

## 2019-11-18 DIAGNOSIS — F3340 Major depressive disorder, recurrent, in remission, unspecified: Secondary | ICD-10-CM

## 2019-11-18 DIAGNOSIS — I6522 Occlusion and stenosis of left carotid artery: Secondary | ICD-10-CM | POA: Diagnosis not present

## 2019-11-18 DIAGNOSIS — Z23 Encounter for immunization: Secondary | ICD-10-CM | POA: Diagnosis not present

## 2019-11-18 DIAGNOSIS — N1831 Chronic kidney disease, stage 3a: Secondary | ICD-10-CM | POA: Diagnosis not present

## 2019-11-18 DIAGNOSIS — I779 Disorder of arteries and arterioles, unspecified: Secondary | ICD-10-CM | POA: Insufficient documentation

## 2019-11-18 LAB — RENAL FUNCTION PANEL
Albumin: 4.1 g/dL (ref 3.5–5.2)
BUN: 18 mg/dL (ref 6–23)
CO2: 30 mEq/L (ref 19–32)
Calcium: 9.3 mg/dL (ref 8.4–10.5)
Chloride: 104 mEq/L (ref 96–112)
Creatinine, Ser: 1.13 mg/dL (ref 0.40–1.50)
GFR: 66.81 mL/min (ref >60.00)
Glucose, Bld: 81 mg/dL (ref 70–99)
Phosphorus: 3.1 mg/dL (ref 2.3–4.6)
Potassium: 4.2 mEq/L (ref 3.5–5.1)
Sodium: 139 mEq/L (ref 135–145)

## 2019-11-18 NOTE — Progress Notes (Signed)
Subjective:    Patient ID: [facility], male    DOB: [birth year], 69 y.o.   MRN: [id number]  HPI Here for follow up after an apparent TIA This visit occurred during the SARS-CoV-2 public health emergency.  Safety protocols were in place, including screening questions prior to the visit, additional usage of staff PPE, and extensive cleaning of exam room while observing appropriate contact time as indicated for disinfecting solutions.   No more spells Feeling okay No more tingling, cloudy head or trouble with speech  This has affected his emotions Not truly depressed but has been affected by this episode and concerned about the MRI results and kidney function "Some things in my past that I need to work through" Interested in Patent examiner  Current Outpatient Medications on File Prior to Visit  Medication Sig Dispense Refill  . acetaminophen (TYLENOL) 650 MG CR tablet Take 650 mg by mouth every 8 (eight) hours as needed for pain.    . Cholecalciferol (EQL VITAMIN D3) 50 MCG (2000 UT) CAPS Take by mouth.    . Cyanocobalamin (B-12 PO) Take by mouth.    . DHA-EPA-GLA PO Take by mouth.    . Ferrous Sulfate (IRON PO) Take by mouth.    Marland Kitchen MAGNESIUM PO Take by mouth.    . Multiple Vitamins-Minerals (MULTIVITAMIN PO) Take 1 tablet by mouth daily.    . Pyridoxine HCl (B-6 PO) Take by mouth.    Marland Kitchen ZINC OXIDE PO Take by mouth.     No current facility-administered medications on file prior to visit.    No Known Allergies  Past Medical History:  Diagnosis Date  . Anxiety   . Depression   . Hyperlipidemia   . Osteoarthritis   . Pneumonia 10/04/98   out patient  . Rhinitis, allergic     Past Surgical History:  Procedure Laterality Date  . COLONOSCOPY    . EYE SURGERY Bilateral   . JOINT REPLACEMENT  2/13///9/13   medial right knee///medial left knee---Dr Jefm Bryant  . KNEE ARTHROSCOPY W/ PARTIAL MEDIAL MENISCECTOMY Right 2008   with pateller chondroplasty ([facility])  . KNEE  SURGERY  1992   right, loose body removed  . KNEE SURGERY     multiple surgeries to both knees  . MEDIAL PARTIAL KNEE REPLACEMENT Left    Dr. Jefm Bryant  . MEDIAL PARTIAL KNEE REPLACEMENT Right    Dr. Jefm Bryant  . TONSILLECTOMY    . TOTAL HIP ARTHROPLASTY Right 01/08/2018   Procedure: RIGHT TOTAL HIP ARTHROPLASTY ANTERIOR APPROACH;  Surgeon: Gaynelle Arabian, MD;  Location: [facility];  Service: Orthopedics;  Laterality: Right;  139min  . TOTAL KNEE ARTHROPLASTY WITH REVISION COMPONENTS Left 02/21/2015   Procedure: TOTAL KNEE ARTHROPLASTY WITH REVISION COMPONENTS;  Surgeon: Frederik Pear, MD;  Location: [facility];  Service: Orthopedics;  Laterality: Left;    Family History  Problem Relation Age of Onset  . Heart disease Mother        CAD, MI  . Hypertension Mother   . Stroke Father   . Aneurysm Father        died from rupture of AAA  . Cancer Paternal Grandfather        prostate    Social History   Socioeconomic History  . Marital status: Married    Spouse name: Not on file  . Number of children: 5  . Years of education: Not on file  . Highest education level: Not on file  Occupational History  . 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 Occupation: Landlord  Tobacco Use  . Smoking status: Never Smoker  . Smokeless tobacco: Never Used  Vaping Use  . Vaping Use: Never used  Substance and Sexual Activity  . Alcohol use: Yes    Comment: 2 beers a day in general.  . Drug use: No  . Sexual activity: Not on file  Other Topics Concern  . Not on file  Social History Narrative   2nd marriage.  3 children from first marriage, 2 stepchildren, 2 with current marriage.      Has living will   Wife is health care POA--son Hilliard Clark would be alternate   Would accept resuscitation   Would accept tube feeds briefly but not if cognitively unaware   Social Determinants of Health   Financial Resource Strain:   . Difficulty of Paying Living Expenses: Not on file  Food  Insecurity:   . Worried About Charity fundraiser in the Last Year: Not on file  . Ran Out of Food in the Last Year: Not on file  Transportation Needs:   . Lack of Transportation (Medical): Not on file  . Lack of Transportation (Non-Medical): Not on file  Physical Activity:   . Days of Exercise per Week: Not on file  . Minutes of Exercise per Session: Not on file  Stress:   . Feeling of Stress : Not on file  Social Connections:   . Frequency of Communication with Friends and Family: Not on file  . Frequency of Social Gatherings with Friends and Family: Not on file  . Attends Religious Services: Not on file  . Active Member of Clubs or Organizations: Not on file  . Attends Archivist Meetings: Not on file  . Marital Status: Not on file  Intimate Partner Violence:   . Fear of Current or Ex-Partner: Not on file  . Emotionally Abused: Not on file  . Physically Abused: Not on file  . Sexually Abused: Not on file   Review of Systems Some burping Some initial sleep problems---now doing okay again. Still disturbed by nocturia--trying to be careful by evening fluids. Can only be once now (but as much as 3-4)    Objective:   Physical Exam Constitutional:      Appearance: Normal appearance.  Neurological:     Mental Status: He is alert.  Psychiatric:     Comments: Appears subdued and somewhat depressed            Assessment & Plan:

## 2019-11-18 NOTE — Patient Instructions (Signed)
Please start the aspirin---81mg  every other day. I would recommend a moderate dose statin (cholesterol medication) like atorvastatin 20mg  daily or rosuvastatin 10mg  daily. Let me know if you are willing to start this.

## 2019-11-18 NOTE — Assessment & Plan Note (Signed)
First time it was low Will recheck Nephrology evaluation if still low---would consider ARB

## 2019-11-18 NOTE — Assessment & Plan Note (Signed)
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

## 2019-11-18 NOTE — Assessment & Plan Note (Signed)
Mostly sensory but also some aphasia Mild carotid changes MRI shows slight vascular damage---generally reassuring Discussed risk mitigation, etc

## 2019-11-18 NOTE — Addendum Note (Signed)
Addended by: Pilar Grammes on: 11/18/2019 04:24 PM   Modules accepted: Orders

## 2019-11-18 NOTE — Assessment & Plan Note (Signed)
Seems to have flared due to these events Will set up with behavioral medicine May need to restart his citalopram

## 2019-11-25 ENCOUNTER — Telehealth: Payer: Self-pay | Admitting: Internal Medicine

## 2019-11-25 MED ORDER — CITALOPRAM HYDROBROMIDE 20 MG PO TABS: 20.0000 mg | ORAL_TABLET | Freq: Every day | ORAL | 3 refills | Status: DC

## 2019-11-25 NOTE — Telephone Encounter (Signed)
Spoke to pt. Made appt 12-29-19. He will call us before that if symptoms do not improve.

## 2019-11-25 NOTE — Telephone Encounter (Signed)
Please let him know that I sent in a prescription for the citalopram that he used to take--but at a lower dose (20mg ). Let me know if he has problems or wants something different. Set up appt in 3-4 weeks to see how he is doing

## 2019-11-25 NOTE — Telephone Encounter (Signed)
Pt called stating he is having problems with anxiety and sleeping.  And wanted to see if dr Silvio Pate could call him in something.  He stated he has spoke to dr Silvio Pate about this  [place] [facility]   Please advise

## 2019-11-25 NOTE — Telephone Encounter (Signed)
He called in and he wanted to let dr.Letvak know he lost 3 more pounds , He has 182 about 6-8 weeks and now hes 170

## 2019-11-30 ENCOUNTER — Ambulatory Visit (INDEPENDENT_AMBULATORY_CARE_PROVIDER_SITE_OTHER): Payer: Medicare Other | Admitting: Internal Medicine

## 2019-11-30 ENCOUNTER — Telehealth: Payer: Self-pay | Admitting: Internal Medicine

## 2019-11-30 ENCOUNTER — Encounter: Payer: Self-pay | Admitting: Internal Medicine

## 2019-11-30 ENCOUNTER — Other Ambulatory Visit: Payer: Self-pay

## 2019-11-30 DIAGNOSIS — Z8673 Personal history of transient ischemic attack (TIA), and cerebral infarction without residual deficits: Secondary | ICD-10-CM

## 2019-11-30 DIAGNOSIS — F331 Major depressive disorder, recurrent, moderate: Secondary | ICD-10-CM

## 2019-11-30 DIAGNOSIS — R142 Eructation: Secondary | ICD-10-CM | POA: Diagnosis not present

## 2019-11-30 MED ORDER — LORAZEPAM 0.5 MG PO TABS: 0.5000 mg | ORAL_TABLET | Freq: Two times a day (BID) | ORAL | 0 refills | Status: DC | PRN

## 2019-11-30 NOTE — Telephone Encounter (Signed)
Spoke to wife. Made appt at 1:45.

## 2019-11-30 NOTE — Progress Notes (Signed)
Subjective:    Patient ID: Brendan Bell, male    DOB: [birth year], 69 y.o.   MRN: [id number]  HPI Here due to belching and trouble eating. With wife This visit occurred during the SARS-CoV-2 public health emergency.  Safety protocols were in place, including screening questions prior to the visit, additional usage of staff PPE, and extensive cleaning of exam room while observing appropriate contact time as indicated for disinfecting solutions.   "I am pretty nervous" Belching a lot--especially if drinking water Stopped carbonated drinks and beer (had been 2 a day) He thinks the belching may be a nervous habit This is aggravating his throat--affecting his swallowing Has taken 5-6 famotidine in the past few days  Now he can't sleep Not eating right Has lost a few pounds  Reviewed neurologic symptoms ?silent migraine? 10/14 --they [place] to go [place] and he choked  He feels he almost lost consciousness   After stopping his citalopram at the beginning of the year---wife noted compulsive behaviors----had to change soaps, cooking equipment, etc  Has appt tomrrow with behavioral medicine  Current Outpatient Medications on File Prior to Visit  Medication Sig Dispense Refill  . acetaminophen (TYLENOL) 650 MG CR tablet Take 650 mg by mouth every 8 (eight) hours as needed for pain.    Marland Kitchen aspirin EC 81 MG tablet Take 81 mg by mouth every other day. Swallow whole.    . Cholecalciferol (EQL VITAMIN D3) 50 MCG (2000 UT) CAPS Take by mouth.    . citalopram (CELEXA) 20 MG tablet Take 1 tablet (20 mg total) by mouth daily. 90 tablet 3  . famotidine (PEPCID) 10 MG tablet Take 10 mg by mouth 2 (two) times daily.    . Magnesium Gluconate 500 (27 Mg) MG TABS Take by mouth.    Marland Kitchen MAGNESIUM PO Take by mouth.    . Multiple Vitamins-Minerals (MULTIVITAMIN PO) Take 1 tablet by mouth daily.    . Omega 3 1200 MG CAPS Take by mouth.    Marland Kitchen ZINC GLUCONATE PO Take by mouth.     No  current facility-administered medications on file prior to visit.    No Known Allergies  Past Medical History:  Diagnosis Date  . Anxiety   . Depression   . Hyperlipidemia   . Osteoarthritis   . Pneumonia 10/04/98   out patient  . Rhinitis, allergic     Past Surgical History:  Procedure Laterality Date  . COLONOSCOPY    . EYE SURGERY Bilateral   . JOINT REPLACEMENT  2/13///9/13   medial right knee///medial left knee---Dr Jefm Bryant  . KNEE ARTHROSCOPY W/ PARTIAL MEDIAL MENISCECTOMY Right 2008   with pateller chondroplasty ([facility])  . KNEE SURGERY  1992   right, loose body removed  . KNEE SURGERY     multiple surgeries to both knees  . MEDIAL PARTIAL KNEE REPLACEMENT Left    Dr. Jefm Bryant  . MEDIAL PARTIAL KNEE REPLACEMENT Right    Dr. Jefm Bryant  . TONSILLECTOMY    . TOTAL HIP ARTHROPLASTY Right 01/08/2018   Procedure: RIGHT TOTAL HIP ARTHROPLASTY ANTERIOR APPROACH;  Surgeon: Gaynelle Arabian, MD;  Location: [facility];  Service: Orthopedics;  Laterality: Right;  135min  . TOTAL KNEE ARTHROPLASTY WITH REVISION COMPONENTS Left 02/21/2015   Procedure: TOTAL KNEE ARTHROPLASTY WITH REVISION COMPONENTS;  Surgeon: Frederik Pear, MD;  Location: [place];  Service: Orthopedics;  Laterality: Left;    Family History  Problem Relation Age of Onset  . Heart disease Mother  CAD, MI  . Hypertension Mother   . Stroke Father   . Aneurysm Father        died from rupture of AAA  . Cancer Paternal Grandfather        prostate    Social History   Socioeconomic History  . Marital status: Married    Spouse name: Not on file  . Number of children: 5  . Years of education: Not on file  . Highest education level: Not on file  Occupational History  . 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 Occupation: Landlord  Tobacco Use  . Smoking status: Never Smoker  . Smokeless tobacco: Never Used  Vaping Use  . Vaping Use: Never used  Substance and Sexual Activity  .  Alcohol use: Yes    Comment: 2 beers a day in general.  . Drug use: No  . Sexual activity: Not on file  Other Topics Concern  . Not on file  Social History Narrative   2nd marriage.  3 children from first marriage, 2 stepchildren, 2 with current marriage.      Has living will   Wife is health care POA--son Hilliard Clark would be alternate   Would accept resuscitation   Would accept tube feeds briefly but not if cognitively unaware   Social Determinants of Health   Financial Resource Strain:   . Difficulty of Paying Living Expenses: Not on file  Food Insecurity:   . Worried About Charity fundraiser in the Last Year: Not on file  . Ran Out of Food in the Last Year: Not on file  Transportation Needs:   . Lack of Transportation (Medical): Not on file  . Lack of Transportation (Non-Medical): Not on file  Physical Activity:   . Days of Exercise per Week: Not on file  . Minutes of Exercise per Session: Not on file  Stress:   . Feeling of Stress : Not on file  Social Connections:   . Frequency of Communication with Friends and Family: Not on file  . Frequency of Social Gatherings with Friends and Family: Not on file  . Attends Religious Services: Not on file  . Active Member of Clubs or Organizations: Not on file  . Attends Archivist Meetings: Not on file  . Marital Status: Not on file  Intimate Partner Violence:   . Fear of Current or Ex-Partner: Not on file  . Emotionally Abused: Not on file  . Physically Abused: Not on file  . Sexually Abused: Not on file   Review of Systems Has multiple friends "sick or dying" MIL with worsening dementia---probably needs SNF soon Feels there is a cascade of symptoms---back pain (spinal stenosis) No N/V    Objective:   Physical Exam HENT:     Mouth/Throat:     Comments: No oral lesions Cardiovascular:     Rate and Rhythm: Normal rate and regular rhythm.     Heart sounds: No murmur heard.  No gallop.   Pulmonary:     Effort:  Pulmonary effort is normal.     Breath sounds: Normal breath sounds. No wheezing or rales.  Abdominal:     Palpations: Abdomen is soft. There is no mass.     Tenderness: There is no abdominal tenderness. There is no guarding.  Musculoskeletal:     Cervical back: Neck supple.  Lymphadenopathy:     Cervical: No cervical adenopathy.  Psychiatric:     Comments: Anxious Not clearly depressed  Assessment & Plan:

## 2019-11-30 NOTE — Assessment & Plan Note (Signed)
He had another episode in the car---but this doesn't sound neurologic (just choked) He wonders about acephalic migraine--with the other spells. Discussed that this is possible. Is on the ASA just in case

## 2019-11-30 NOTE — Telephone Encounter (Signed)
PT CALLED IN WANTED TO SPEAK TO DR. Silvio Pate NURSE ABOUT MEDICATION HE WAS PRESCRIBED

## 2019-11-30 NOTE — Assessment & Plan Note (Signed)
Seems to be a nervous tic No clear acid symptoms--but could be Okay to use the famotidine--or can switch to omeprazole

## 2019-11-30 NOTE — Assessment & Plan Note (Signed)
Depressed again and anxious Some obsessive tendencies Has recently restarted the citalopram---may need to increase if no response in 2-3 weeks Will add lorazepam for prn use---including to help sleep

## 2019-11-30 NOTE — Telephone Encounter (Signed)
okay

## 2019-11-30 NOTE — Patient Instructions (Signed)
Please stop all your supplements for now. Continue melatonin 5-10mg  at bedtime and you can try adding the lorazepam as well if needed. Continue the famotidine 10mg  ---I would recommend twice a day for now. If the burping, etc doesn't improve, you should try daily omeprazole 20mg  (on empty stomach) to see if that helps. Let me know if the depression and anxiety are not any better in 2 weeks---we will increase the citalopram to 40mg  daily.

## 2019-12-01 ENCOUNTER — Ambulatory Visit: Payer: Medicare Other | Admitting: Internal Medicine

## 2019-12-01 ENCOUNTER — Ambulatory Visit (INDEPENDENT_AMBULATORY_CARE_PROVIDER_SITE_OTHER): Payer: Medicare Other | Admitting: Psychologist

## 2019-12-01 DIAGNOSIS — F320 Major depressive disorder, single episode, mild: Secondary | ICD-10-CM

## 2019-12-01 DIAGNOSIS — F4520 Hypochondriacal disorder, unspecified: Secondary | ICD-10-CM | POA: Diagnosis not present

## 2019-12-01 DIAGNOSIS — Z634 Disappearance and death of family member: Secondary | ICD-10-CM | POA: Diagnosis not present

## 2019-12-09 ENCOUNTER — Telehealth: Payer: Self-pay | Admitting: Internal Medicine

## 2019-12-09 NOTE — Telephone Encounter (Signed)
Pt saw dr Silvio Pate 11/1 and dr Silvio Pate mention going up on his celexa and he wanted to know if he should go up on dosage.

## 2019-12-09 NOTE — Telephone Encounter (Signed)
Have him increase to 30mg  daiy (1.5 tabs) If he is no better in 2-3 weeks, we can consider going up to 40mg 

## 2019-12-09 NOTE — Telephone Encounter (Signed)
Spoke to pt. He said he will take 1.5 tablets for 2-3 weeks and update [place]. He has been taking the lorazepam at bedtime every night.

## 2019-12-11 ENCOUNTER — Ambulatory Visit: Payer: Medicare Other | Admitting: Psychologist

## 2019-12-11 ENCOUNTER — Ambulatory Visit (INDEPENDENT_AMBULATORY_CARE_PROVIDER_SITE_OTHER): Payer: PRIVATE HEALTH INSURANCE | Admitting: Psychologist

## 2019-12-11 DIAGNOSIS — F320 Major depressive disorder, single episode, mild: Secondary | ICD-10-CM | POA: Diagnosis not present

## 2019-12-11 DIAGNOSIS — Z634 Disappearance and death of family member: Secondary | ICD-10-CM | POA: Diagnosis not present

## 2019-12-11 DIAGNOSIS — F4521 Hypochondriasis: Secondary | ICD-10-CM

## 2019-12-15 ENCOUNTER — Encounter (INDEPENDENT_AMBULATORY_CARE_PROVIDER_SITE_OTHER): Payer: Self-pay

## 2019-12-29 ENCOUNTER — Ambulatory Visit (INDEPENDENT_AMBULATORY_CARE_PROVIDER_SITE_OTHER): Payer: Medicare Other | Admitting: Internal Medicine

## 2019-12-29 ENCOUNTER — Other Ambulatory Visit: Payer: Self-pay

## 2019-12-29 ENCOUNTER — Ambulatory Visit (INDEPENDENT_AMBULATORY_CARE_PROVIDER_SITE_OTHER): Payer: Medicare Other | Admitting: Psychologist

## 2019-12-29 ENCOUNTER — Encounter: Payer: Self-pay | Admitting: Internal Medicine

## 2019-12-29 VITALS — BP 138/88 | HR 74 | Temp 98.1°F | Ht 67.0 in | Wt 169.0 lb

## 2019-12-29 DIAGNOSIS — F320 Major depressive disorder, single episode, mild: Secondary | ICD-10-CM

## 2019-12-29 DIAGNOSIS — F4521 Hypochondriasis: Secondary | ICD-10-CM | POA: Diagnosis not present

## 2019-12-29 DIAGNOSIS — F331 Major depressive disorder, recurrent, moderate: Secondary | ICD-10-CM

## 2019-12-29 DIAGNOSIS — Z634 Disappearance and death of family member: Secondary | ICD-10-CM | POA: Diagnosis not present

## 2019-12-29 MED ORDER — ARIPIPRAZOLE 2 MG PO TABS: 2.0000 mg | ORAL_TABLET | Freq: Every day | ORAL | 1 refills | Status: DC

## 2019-12-29 MED ORDER — CITALOPRAM HYDROBROMIDE 40 MG PO TABS: 40.0000 mg | ORAL_TABLET | Freq: Every day | ORAL | 3 refills | Status: DC

## 2019-12-29 NOTE — Patient Instructions (Signed)
Please start the aripiprazole 2mg  at bedtime tonight and stay on it. If you are still not sleeping well and the anxiety is not mostly gone in 1 week, increase to 4mg  at bedtime.

## 2019-12-29 NOTE — Progress Notes (Signed)
Subjective:    Patient ID: Brendan Bell, male    DOB: [birth year], 69 y.o.   MRN: [id number]  HPI Here with wife for follow up of depression, etc This visit occurred during the SARS-CoV-2 public health emergency.  Safety protocols were in place, including screening questions prior to the visit, additional usage of staff PPE, and extensive cleaning of exam room while observing appropriate contact time as indicated for disinfecting solutions.   Still worried about his weight Has to work hard to maintain his weight Trying to "eat as much as I can" Has restarted exercise---walking  Still burping---seems to be anxiety related Only one episode of heartburn in past month--after eating out No dysphagia  He notes both depression and anxiety Depression does seem better-- "I don't feel that this cloud is over me" Anxiety seems worse though Intolerant of sensory changes---anxious if he gets cold, hesitant to go out to eat (afraid of different feelings) Not sleeping well--that causes anxiety. Lorazepam not really helping  Did go up to 30 mg of citalopram 2.5 weeks ago Then increased to 40mg  a few days ago  Current Outpatient Medications on File Prior to Visit  Medication Sig Dispense Refill  . acetaminophen (TYLENOL) 650 MG CR tablet Take 650 mg by mouth every 8 (eight) hours as needed for pain.    Marland Kitchen aspirin EC 81 MG tablet Take 81 mg by mouth every other day. Swallow whole.    . Cholecalciferol (EQL VITAMIN D3) 50 MCG (2000 UT) CAPS Take by mouth.    . citalopram (CELEXA) 20 MG tablet Take 1 tablet (20 mg total) by mouth daily. (Patient taking differently: Take 40 mg by mouth daily. ) 90 tablet 3  . LORazepam (ATIVAN) 0.5 MG tablet Take 1 tablet (0.5 mg total) by mouth 2 (two) times daily as needed for anxiety. 30 tablet 0  . Magnesium Gluconate 500 (27 Mg) MG TABS Take by mouth.    Marland Kitchen MAGNESIUM PO Take by mouth.    . Multiple Vitamins-Minerals (MULTIVITAMIN PO) Take 1 tablet by mouth  daily.    . Omega 3 1200 MG CAPS Take by mouth.    Marland Kitchen ZINC GLUCONATE PO Take by mouth.     No current facility-administered medications on file prior to visit.    No Known Allergies  Past Medical History:  Diagnosis Date  . Anxiety   . Depression   . Hyperlipidemia   . Osteoarthritis   . Pneumonia 10/04/98   out patient  . Rhinitis, allergic     Past Surgical History:  Procedure Laterality Date  . COLONOSCOPY    . EYE SURGERY Bilateral   . JOINT REPLACEMENT  2/13///9/13   medial right knee///medial left knee---Dr Jefm Bryant  . KNEE ARTHROSCOPY W/ PARTIAL MEDIAL MENISCECTOMY Right 2008   with pateller chondroplasty ([facility])  . KNEE SURGERY  1992   right, loose body removed  . KNEE SURGERY     multiple surgeries to both knees  . MEDIAL PARTIAL KNEE REPLACEMENT Left    Dr. Jefm Bryant  . MEDIAL PARTIAL KNEE REPLACEMENT Right    Dr. Jefm Bryant  . TONSILLECTOMY    . TOTAL HIP ARTHROPLASTY Right 01/08/2018   Procedure: RIGHT TOTAL HIP ARTHROPLASTY ANTERIOR APPROACH;  Surgeon: Gaynelle Arabian, MD;  Location: [facility];  Service: Orthopedics;  Laterality: Right;  162min  . TOTAL KNEE ARTHROPLASTY WITH REVISION COMPONENTS Left 02/21/2015   Procedure: TOTAL KNEE ARTHROPLASTY WITH REVISION COMPONENTS;  Surgeon: Frederik Pear, MD;  Location: [place];  Service:  Orthopedics;  Laterality: Left;    Family History  Problem Relation Age of Onset  . Heart disease Mother        CAD, MI  . Hypertension Mother   . Stroke Father   . Aneurysm Father        died from rupture of AAA  . Cancer Paternal Grandfather        prostate    Social History   Socioeconomic History  . Marital status: Married    Spouse name: Not on file  . Number of children: 5  . Years of education: Not on file  . Highest education level: Not on file  Occupational History  . 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 Occupation: Landlord  Tobacco Use  . Smoking status: Never Smoker  . Smokeless  tobacco: Never Used  Vaping Use  . Vaping Use: Never used  Substance and Sexual Activity  . Alcohol use: Yes    Comment: 2 beers a day in general.  . Drug use: No  . Sexual activity: Not on file  Other Topics Concern  . Not on file  Social History Narrative   2nd marriage.  3 children from first marriage, 2 stepchildren, 2 with current marriage.      Has living will   Wife is health care POA--son Hilliard Clark would be alternate   Would accept resuscitation   Would accept tube feeds briefly but not if cognitively unaware   Social Determinants of Health   Financial Resource Strain:   . Difficulty of Paying Living Expenses: Not on file  Food Insecurity:   . Worried About Charity fundraiser in the Last Year: Not on file  . Ran Out of Food in the Last Year: Not on file  Transportation Needs:   . Lack of Transportation (Medical): Not on file  . Lack of Transportation (Non-Medical): Not on file  Physical Activity:   . Days of Exercise per Week: Not on file  . Minutes of Exercise per Session: Not on file  Stress:   . Feeling of Stress : Not on file  Social Connections:   . Frequency of Communication with Friends and Family: Not on file  . Frequency of Social Gatherings with Friends and Family: Not on file  . Attends Religious Services: Not on file  . Active Member of Clubs or Organizations: Not on file  . Attends Archivist Meetings: Not on file  . Marital Status: Not on file  Intimate Partner Violence:   . Fear of Current or Ex-Partner: Not on file  . Emotionally Abused: Not on file  . Physically Abused: Not on file  . Sexually Abused: Not on file   Review of Systems Weight is stable from last time    Objective:   Physical Exam Constitutional:      Appearance: Normal appearance.  Neurological:     Mental Status: He is alert.  Psychiatric:     Comments: Some psychomotor retardation No overt depression Notes obsessions about food, etc            Assessment  & Plan:

## 2019-12-29 NOTE — Assessment & Plan Note (Signed)
Has pushed the citalopram to 40mg  and still serious anxiety---though the depression is better Appetite is poor Reluctant to travel--which they have always enjoyed (has trip to [place] coming up in 10 days)  Will continue the citalopram 40mg  Will add abilify 2mg  and increase to 4mg  in 1 week if needed

## 2020-01-13 ENCOUNTER — Ambulatory Visit (INDEPENDENT_AMBULATORY_CARE_PROVIDER_SITE_OTHER): Payer: Medicare Other | Admitting: Psychologist

## 2020-01-13 DIAGNOSIS — F320 Major depressive disorder, single episode, mild: Secondary | ICD-10-CM

## 2020-01-13 DIAGNOSIS — F4521 Hypochondriasis: Secondary | ICD-10-CM

## 2020-01-13 DIAGNOSIS — Z634 Disappearance and death of family member: Secondary | ICD-10-CM

## 2020-01-26 ENCOUNTER — Other Ambulatory Visit: Payer: Self-pay

## 2020-01-26 ENCOUNTER — Ambulatory Visit (INDEPENDENT_AMBULATORY_CARE_PROVIDER_SITE_OTHER): Payer: Medicare Other | Admitting: Internal Medicine

## 2020-01-26 ENCOUNTER — Encounter: Payer: Self-pay | Admitting: Internal Medicine

## 2020-01-26 DIAGNOSIS — F331 Major depressive disorder, recurrent, moderate: Secondary | ICD-10-CM | POA: Diagnosis not present

## 2020-01-26 NOTE — Assessment & Plan Note (Signed)
Now seems to be in remission with abilfy in addition to the citalopram Will continue both---no wean for at least 9 months

## 2020-01-26 NOTE — Progress Notes (Signed)
Subjective:    Patient ID: Brendan Bell, male    DOB: August 28, 1950, 69 y.o.   MRN: [id number]  HPI Here for follow up on major depression with exacerbation This visit occurred during the SARS-CoV-2 public health emergency.  Safety protocols were in place, including screening questions prior to the visit, additional usage of staff PPE, and extensive cleaning of exam room while observing appropriate contact time as indicated for disinfecting solutions.   Doing much better Still on the 2mg  of abilify Feels his sleep is about the same---nocturia x 2 and some delay in re-initating sleep  Appetite is back--"with a vengeance" Has to be careful   Back to enjoying life Sex life is back to normal for them  Current Outpatient Medications on File Prior to Visit  Medication Sig Dispense Refill  . acetaminophen (TYLENOL) 650 MG CR tablet Take 650 mg by mouth every 8 (eight) hours as needed for pain.    . ARIPiprazole (ABILIFY) 2 MG tablet Take 1-2 tablets (2-4 mg total) by mouth daily. 60 tablet 1  . aspirin EC 81 MG tablet Take 81 mg by mouth every other day. Swallow whole.    . Cholecalciferol (EQL VITAMIN D3) 50 MCG (2000 UT) CAPS Take by mouth.    . citalopram (CELEXA) 40 MG tablet Take 40 mg by mouth daily.    . Magnesium Gluconate 500 (27 Mg) MG TABS Take by mouth.    Marland Kitchen MAGNESIUM PO Take by mouth.    . Multiple Vitamins-Minerals (MULTIVITAMIN PO) Take 1 tablet by mouth daily.    . Omega 3 1200 MG CAPS Take by mouth.    Marland Kitchen ZINC GLUCONATE PO Take by mouth.     No current facility-administered medications on file prior to visit.    No Known Allergies  Past Medical History:  Diagnosis Date  . Anxiety   . Depression   . Hyperlipidemia   . Osteoarthritis   . Pneumonia 10/04/98   out patient  . Rhinitis, allergic     Past Surgical History:  Procedure Laterality Date  . COLONOSCOPY    . EYE SURGERY Bilateral   . JOINT REPLACEMENT  2/13///9/13   medial right knee///medial left  knee---Dr Jefm Bryant  . KNEE ARTHROSCOPY W/ PARTIAL MEDIAL MENISCECTOMY Right 2008   with pateller chondroplasty ([facility])  . KNEE SURGERY  1992   right, loose body removed  . KNEE SURGERY     multiple surgeries to both knees  . MEDIAL PARTIAL KNEE REPLACEMENT Left    Dr. Jefm Bryant  . MEDIAL PARTIAL KNEE REPLACEMENT Right    Dr. Jefm Bryant  . TONSILLECTOMY    . TOTAL HIP ARTHROPLASTY Right 01/08/2018   Procedure: RIGHT TOTAL HIP ARTHROPLASTY ANTERIOR APPROACH;  Surgeon: Gaynelle Arabian, MD;  Location: [facility];  Service: Orthopedics;  Laterality: Right;  112min  . TOTAL KNEE ARTHROPLASTY WITH REVISION COMPONENTS Left 02/21/2015   Procedure: TOTAL KNEE ARTHROPLASTY WITH REVISION COMPONENTS;  Surgeon: Frederik Pear, MD;  Location: [place];  Service: Orthopedics;  Laterality: Left;    Family History  Problem Relation Age of Onset  . Heart disease Mother        CAD, MI  . Hypertension Mother   . Stroke Father   . Aneurysm Father        died from rupture of AAA  . Cancer Paternal Grandfather        prostate    Social History   Socioeconomic History  . Marital status: Married    Spouse name:  Not on file  . Number of children: 5  . Years of education: Not on file  . Highest education level: Not on file  Occupational History  . 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 Occupation: Landlord  Tobacco Use  . Smoking status: Never Smoker  . Smokeless tobacco: Never Used  Vaping Use  . Vaping Use: Never used  Substance and Sexual Activity  . Alcohol use: Yes    Comment: 2 beers a day in general.  . Drug use: No  . Sexual activity: Not on file  Other Topics Concern  . Not on file  Social History Narrative   2nd marriage.  3 children from first marriage, 2 stepchildren, 2 with current marriage.      Has living will   Wife is health care POA--son Gregary Signs would be alternate   Would accept resuscitation   Would accept tube feeds briefly but not if cognitively unaware    Social Determinants of Health   Financial Resource Strain: Not on file  Food Insecurity: Not on file  Transportation Needs: Not on file  Physical Activity: Not on file  Stress: Not on file  Social Connections: Not on file  Intimate Partner Violence: Not on file   Review of Systems  Some situational anxiety--no sig change Just finished 7 days of amoxil---tooth abscess. Will need redo of root canal soon      Objective:   Physical Exam Constitutional:      Appearance: Normal appearance.  Neurological:     Mental Status: He is alert.  Psychiatric:        Mood and Affect: Mood normal.        Behavior: Behavior normal.            Assessment & Plan:

## 2020-02-11 ENCOUNTER — Ambulatory Visit (INDEPENDENT_AMBULATORY_CARE_PROVIDER_SITE_OTHER): Payer: Medicare Other | Admitting: Psychologist

## 2020-02-11 DIAGNOSIS — F4521 Hypochondriasis: Secondary | ICD-10-CM

## 2020-02-11 DIAGNOSIS — Z634 Disappearance and death of family member: Secondary | ICD-10-CM | POA: Diagnosis not present

## 2020-02-11 DIAGNOSIS — F320 Major depressive disorder, single episode, mild: Secondary | ICD-10-CM | POA: Diagnosis not present

## 2020-03-29 ENCOUNTER — Ambulatory Visit (INDEPENDENT_AMBULATORY_CARE_PROVIDER_SITE_OTHER): Payer: Medicare Other | Admitting: Psychologist

## 2020-03-29 DIAGNOSIS — F4521 Hypochondriasis: Secondary | ICD-10-CM

## 2020-03-29 DIAGNOSIS — Z634 Disappearance and death of family member: Secondary | ICD-10-CM | POA: Diagnosis not present

## 2020-04-04 ENCOUNTER — Telehealth: Payer: Self-pay

## 2020-04-04 NOTE — Telephone Encounter (Signed)
[facility] - Client TELEPHONE ADVICE RECORD [facility] Patient Name: Brendan Bell Gender: Male DOB: 1950-11-22 Age: 70 Y 16 M 11 D Return Phone Number: [phone] (Primary), [phone] (Secondary) Address[place] [address] Client [name] - Client Client Site [name] Physician [name]- MD Contact Type Call Who Is Calling Patient / Member / Family / Caregiver Call Type Triage / Clinical Relationship To Patient Self Return Phone Number [phone] (Primary) Chief Complaint Vomiting Reason for Call Symptomatic / Request for Health Information Initial Comment Office is transferring pt with fever (no current/on and off for several days) , abd pain, vomiting and diarrhea (has urinated) who needs triage (wants to be seen). Had Covid 3-4 weeks ago. Covid tested at home yesterday (neg). Asks to be seen by Dr. Silvio Pate. Translation No Nurse Assessment Nurse: Sherrell Puller, RN, Amy Date/Time Eilene Ghazi Time): 04/04/2020 8:18:08 AM Confirm and document reason for call. If symptomatic, describe symptoms. ---Caller states he's had intermittent fever, abdominal pain-mostly on the right side (upper, mid and lower). Says he through up "violently" Friday night. Rates abdominal pain today a 5/10, pain is intermittent. Feels bloated all the time and can't eat much since Friday. Diarrhea, vomiting. Last had fever of 101 yesterday. Covid 3-4 weeks ago. Covid test at home was negative yesterday. No respiratory symptoms. Does the patient have any new or worsening symptoms? ---Yes Will a triage be completed? ---Yes Related visit to physician within the last 2 weeks? ---No Does the PT have any chronic conditions? (i.e. diabetes, asthma, this includes High risk factors for pregnancy, etc.) ---Yes List chronic conditions. ---Anxiety Is this a behavioral health or substance abuse call?  ---No Guidelines Guideline Title Affirmed Question Affirmed Notes Nurse Date/Time Eilene Ghazi Time) Abdominal Pain - Male [1] SEVERE pain AND [2] age > 8 years Sherrell Puller, RN, Amy 04/04/2020 8:25:34 AM Disp. Time Eilene Ghazi Time) Disposition Final User 04/04/2020 8:33:50 AM Go to ED Now Yes Sherrell Puller, RN, Amy PLEASE NOTE: All timestamps contained within this report are represented as [place] Standard Time. CONFIDENTIALTY NOTICE: This fax transmission is intended only for the addressee. It contains information that is legally privileged, confidential or otherwise protected from use or disclosure. If you are not the intended recipient, you are strictly prohibited from reviewing, disclosing, copying using or disseminating any of this information or taking any action in reliance on or regarding this information. If you have received this fax in error, please notify us immediately by telephone so that we can arrange for its return to [place]. Phone: [phone], Toll-Free: [phone], Fax: [phone] Page: 2 of 2 Call Id: [id number] [place] Disagree/Comply Comply Caller Understands Yes PreDisposition Call Doctor Care Advice Given Per Guideline GO TO ED NOW: * You need to be seen in the Emergency Department. * Go to the ED at ___________ [place] now. Drive carefully. ANOTHER ADULT SHOULD DRIVE: * It is better and safer if another adult drives instead of you. NOTHING BY MOUTH: * Do not eat or drink anything for now. * Reason: condition may need surgery and general anesthesia. BRING MEDICINES: * Bring a list of your current medicines when you go to the Emergency Department (ER). * Bring the pill bottles too. This will help the doctor (or NP/PA) to make certain you are taking the right medicines and the right dose. CARE ADVICE given per Abdominal Pain, Male (Adult) guideline. This nurse also did Covid diagnosed/suspected guideline and dispostion was for patient  to be seen within the next 4 hours. Care  advice given and patient verbalized understanding. For abdominal pain guideline advised patient to go to ER because his abdominal pain has seemed severe for him. Says sometimes it's hard to "straighten up" when he first gets up and this nurse asked him if it's because his pain is sever and he states that "yes it was significant". Patient states "[facility] to [facility] if I go." Did not sound for certain that patient would actually go to ER, so this nurse called office to make them aware and they say that a triage nurse will call patient back.

## 2020-04-04 NOTE — Telephone Encounter (Signed)
I spoke with pt; Pt states he is feeling better and is going to hold off on going to ED or UC. he still has appendix, lower rt side pain and if mashes on rt side hurts worse, pain radiates from rt side to mid abd. 101 fever yesterday but none today. I could not talk pt into going anywhere now but pt said if he felt worse he would go to ED. Pt said he has only vomited x 1 in 24 hrs but has had "a lot" of watery stools. Pt also experiencing bloating; I advised pt should be evaluated and possible testing and pt said if he feels worse he will go to ED. Sending note to Avie Echevaria NP.

## 2020-04-04 NOTE — Telephone Encounter (Signed)
Access nurse called in stating that they spoke with patient this morning and advised patient ;to go to the ER. States that patient was having a hard time moving in the morning because of abdominal pain, as well as having fatigue and fever. She also stated that patient didn't refuse the ER but if he chose to go he would go to [facility]. Let nurse know that I would turn this information over to our triage nurse to follow  Up with patient

## 2020-04-04 NOTE — Telephone Encounter (Signed)
Noted, agree with advice given 

## 2020-04-05 ENCOUNTER — Other Ambulatory Visit: Payer: Self-pay

## 2020-04-05 ENCOUNTER — Inpatient Hospital Stay
Admission: EM | Admit: 2020-04-05 | Discharge: 2020-04-13 | DRG: 373 | Disposition: A | Discharge status: Home / Self Care | Payer: Medicare Other | Attending: General Surgery | Admitting: General Surgery

## 2020-04-05 ENCOUNTER — Emergency Department: Payer: Medicare Other

## 2020-04-05 DIAGNOSIS — K750 Abscess of liver: Secondary | ICD-10-CM | POA: Diagnosis not present

## 2020-04-05 DIAGNOSIS — K651 Peritoneal abscess: Secondary | ICD-10-CM

## 2020-04-05 DIAGNOSIS — Z7982 Long term (current) use of aspirin: Secondary | ICD-10-CM | POA: Diagnosis not present

## 2020-04-05 DIAGNOSIS — I4510 Unspecified right bundle-branch block: Secondary | ICD-10-CM | POA: Diagnosis present

## 2020-04-05 DIAGNOSIS — D72829 Elevated white blood cell count, unspecified: Secondary | ICD-10-CM | POA: Diagnosis not present

## 2020-04-05 DIAGNOSIS — Z823 Family history of stroke: Secondary | ICD-10-CM | POA: Diagnosis not present

## 2020-04-05 DIAGNOSIS — Z96653 Presence of artificial knee joint, bilateral: Secondary | ICD-10-CM | POA: Diagnosis present

## 2020-04-05 DIAGNOSIS — K37 Unspecified appendicitis: Secondary | ICD-10-CM | POA: Diagnosis not present

## 2020-04-05 DIAGNOSIS — Z8042 Family history of malignant neoplasm of prostate: Secondary | ICD-10-CM

## 2020-04-05 DIAGNOSIS — N5089 Other specified disorders of the male genital organs: Secondary | ICD-10-CM | POA: Diagnosis not present

## 2020-04-05 DIAGNOSIS — K3533 Acute appendicitis with perforation and localized peritonitis, with abscess: Principal | ICD-10-CM | POA: Diagnosis present

## 2020-04-05 DIAGNOSIS — M1990 Unspecified osteoarthritis, unspecified site: Secondary | ICD-10-CM | POA: Diagnosis present

## 2020-04-05 DIAGNOSIS — K36 Other appendicitis: Secondary | ICD-10-CM | POA: Diagnosis not present

## 2020-04-05 DIAGNOSIS — E785 Hyperlipidemia, unspecified: Secondary | ICD-10-CM | POA: Diagnosis not present

## 2020-04-05 DIAGNOSIS — I129 Hypertensive chronic kidney disease with stage 1 through stage 4 chronic kidney disease, or unspecified chronic kidney disease: Secondary | ICD-10-CM | POA: Diagnosis present

## 2020-04-05 DIAGNOSIS — Z8673 Personal history of transient ischemic attack (TIA), and cerebral infarction without residual deficits: Secondary | ICD-10-CM

## 2020-04-05 DIAGNOSIS — Z20822 Contact with and (suspected) exposure to covid-19: Secondary | ICD-10-CM | POA: Diagnosis present

## 2020-04-05 DIAGNOSIS — M5137 Other intervertebral disc degeneration, lumbosacral region: Secondary | ICD-10-CM | POA: Diagnosis not present

## 2020-04-05 DIAGNOSIS — R1031 Right lower quadrant pain: Secondary | ICD-10-CM | POA: Diagnosis present

## 2020-04-05 DIAGNOSIS — Z96641 Presence of right artificial hip joint: Secondary | ICD-10-CM | POA: Diagnosis not present

## 2020-04-05 DIAGNOSIS — K2970 Gastritis, unspecified, without bleeding: Secondary | ICD-10-CM | POA: Diagnosis not present

## 2020-04-05 DIAGNOSIS — N1831 Chronic kidney disease, stage 3a: Secondary | ICD-10-CM | POA: Diagnosis not present

## 2020-04-05 DIAGNOSIS — Z79899 Other long term (current) drug therapy: Secondary | ICD-10-CM | POA: Diagnosis not present

## 2020-04-05 DIAGNOSIS — J918 Pleural effusion in other conditions classified elsewhere: Secondary | ICD-10-CM | POA: Diagnosis not present

## 2020-04-05 DIAGNOSIS — Z8249 Family history of ischemic heart disease and other diseases of the circulatory system: Secondary | ICD-10-CM

## 2020-04-05 DIAGNOSIS — K3532 Acute appendicitis with perforation and localized peritonitis, without abscess: Secondary | ICD-10-CM | POA: Diagnosis not present

## 2020-04-05 DIAGNOSIS — R9431 Abnormal electrocardiogram [ECG] [EKG]: Secondary | ICD-10-CM | POA: Diagnosis not present

## 2020-04-05 LAB — COMPREHENSIVE METABOLIC PANEL
ALT: 30 U/L (ref 0–44)
AST: 37 U/L (ref 15–41)
Albumin: 3.3 g/dL — ABNORMAL LOW (ref 3.5–5.0)
Alkaline Phosphatase: 115 U/L (ref 38–126)
Anion gap: 10 (ref 5–15)
BUN: 19 mg/dL (ref 8–23)
CO2: 23 mmol/L (ref 22–32)
Calcium: 8.8 mg/dL — ABNORMAL LOW (ref 8.9–10.3)
Chloride: 94 mmol/L — ABNORMAL LOW (ref 98–111)
Creatinine, Ser: 1.06 mg/dL (ref 0.61–1.24)
GFR, Estimated: >60 mL/min (ref >60)
Glucose, Bld: 104 mg/dL — ABNORMAL HIGH (ref 70–99)
Potassium: 3.7 mmol/L (ref 3.5–5.1)
Sodium: 127 mmol/L — ABNORMAL LOW (ref 135–145)
Total Bilirubin: 1.3 mg/dL — ABNORMAL HIGH (ref 0.3–1.2)
Total Protein: 7 g/dL (ref 6.5–8.1)

## 2020-04-05 LAB — CBC
HCT: 44.1 % (ref 39.0–52.0)
Hemoglobin: 15.5 g/dL (ref 13.0–17.0)
MCH: 29.8 pg (ref 26.0–34.0)
MCHC: 35.1 g/dL (ref 30.0–36.0)
MCV: 84.6 fL (ref 80.0–100.0)
Platelets: 187 10*3/uL (ref 150–400)
RBC: 5.21 MIL/uL (ref 4.22–5.81)
RDW: 13.6 % (ref 11.5–15.5)
WBC: 10.2 10*3/uL (ref 4.0–10.5)
nRBC: 0 % (ref 0.0–0.2)

## 2020-04-05 LAB — URINALYSIS, COMPLETE (UACMP) WITH MICROSCOPIC
Bacteria, UA: NONE SEEN
Bilirubin Urine: NEGATIVE
Glucose, UA: NEGATIVE mg/dL
Ketones, ur: NEGATIVE mg/dL
Leukocytes,Ua: NEGATIVE
Nitrite: NEGATIVE
Protein, ur: NEGATIVE mg/dL
Specific Gravity, Urine: 1.033 — ABNORMAL HIGH (ref 1.005–1.030)
pH: 6 (ref 5.0–8.0)

## 2020-04-05 LAB — RESP PANEL BY RT-PCR (FLU A&B, COVID) ARPGX2
Influenza A by PCR: NEGATIVE
Influenza B by PCR: NEGATIVE
SARS Coronavirus 2 by RT PCR: NEGATIVE

## 2020-04-05 LAB — LIPASE, BLOOD: Lipase: 22 U/L (ref 11–51)

## 2020-04-05 MED ORDER — ARIPIPRAZOLE 2 MG PO TABS
2.0000 mg | ORAL_TABLET | Freq: Every day | ORAL | Status: DC
  Administered 2020-04-06 – 2020-04-12 (×7): 2 mg via ORAL
  Filled 2020-04-05 (×9): qty 1

## 2020-04-05 MED ORDER — ONDANSETRON HCL 4 MG/2ML IJ SOLN: 4.0000 mg | Freq: Four times a day (QID) | INTRAMUSCULAR | Status: DC | PRN

## 2020-04-05 MED ORDER — SODIUM CHLORIDE 0.9 % IV SOLN: Freq: Once | INTRAVENOUS | Status: AC

## 2020-04-05 MED ORDER — PIPERACILLIN-TAZOBACTAM 3.375 G IVPB 30 MIN
3.3750 g | Freq: Once | INTRAVENOUS | Status: AC
  Administered 2020-04-05: 3.375 g via INTRAVENOUS
  Filled 2020-04-05: qty 50

## 2020-04-05 MED ORDER — SODIUM CHLORIDE 0.9 % IV SOLN: INTRAVENOUS | Status: DC

## 2020-04-05 MED ORDER — PIPERACILLIN-TAZOBACTAM 3.375 G IVPB
3.3750 g | Freq: Three times a day (TID) | INTRAVENOUS | Status: DC
  Administered 2020-04-06 – 2020-04-13 (×22): 3.375 g via INTRAVENOUS
  Filled 2020-04-05 (×21): qty 50

## 2020-04-05 MED ORDER — ACETAMINOPHEN 325 MG PO TABS
650.0000 mg | ORAL_TABLET | Freq: Four times a day (QID) | ORAL | Status: DC | PRN
  Administered 2020-04-07 – 2020-04-08 (×2): 650 mg via ORAL
  Filled 2020-04-05: qty 2

## 2020-04-05 MED ORDER — ONDANSETRON 4 MG PO TBDP: 4.0000 mg | ORAL_TABLET | Freq: Four times a day (QID) | ORAL | Status: DC | PRN

## 2020-04-05 MED ORDER — ENOXAPARIN SODIUM 40 MG/0.4ML ~~LOC~~ SOLN
40.0000 mg | SUBCUTANEOUS | Status: DC
  Administered 2020-04-05 – 2020-04-07 (×3): 40 mg via SUBCUTANEOUS
  Filled 2020-04-05 (×3): qty 0.4

## 2020-04-05 MED ORDER — IOHEXOL 300 MG/ML  SOLN
100.0000 mL | Freq: Once | INTRAMUSCULAR | Status: AC | PRN
  Administered 2020-04-05: 100 mL via INTRAVENOUS

## 2020-04-05 MED ORDER — HYDROCODONE-ACETAMINOPHEN 5-325 MG PO TABS
1.0000 | ORAL_TABLET | ORAL | Status: DC | PRN
  Administered 2020-04-06: 1 via ORAL
  Administered 2020-04-06: 2 via ORAL
  Administered 2020-04-06 – 2020-04-08 (×3): 1 via ORAL
  Filled 2020-04-05: qty 1
  Filled 2020-04-05: qty 2
  Filled 2020-04-05: qty 1
  Filled 2020-04-05: qty 2
  Filled 2020-04-05: qty 1

## 2020-04-05 MED ORDER — ACETAMINOPHEN 650 MG RE SUPP: 650.0000 mg | Freq: Four times a day (QID) | RECTAL | Status: DC | PRN

## 2020-04-05 MED ORDER — CITALOPRAM HYDROBROMIDE 20 MG PO TABS
40.0000 mg | ORAL_TABLET | Freq: Every day | ORAL | Status: DC
  Administered 2020-04-06 – 2020-04-12 (×7): 40 mg via ORAL
  Filled 2020-04-05 (×7): qty 2

## 2020-04-05 MED ORDER — PANTOPRAZOLE SODIUM 40 MG IV SOLR
40.0000 mg | Freq: Every day | INTRAVENOUS | Status: DC
  Administered 2020-04-05 – 2020-04-12 (×8): 40 mg via INTRAVENOUS
  Filled 2020-04-05 (×8): qty 40

## 2020-04-05 MED ORDER — MORPHINE SULFATE (PF) 4 MG/ML IV SOLN: 4.0000 mg | INTRAVENOUS | Status: DC | PRN

## 2020-04-05 NOTE — ED Triage Notes (Signed)
Pt to ED for abdominal pain since Friday. Pt states abdominal pain has been radiating from left to right. Reports recent vomiting and diarrhea with fevers but none within the last 24 hours.  Ambulatory to triage  covid 1 month ago, reports tested neg over weekend

## 2020-04-05 NOTE — ED Notes (Signed)
Pt reports lower abd pain for 5days.  Intermittent fevers, diarrhea.  Pt states pain in abd comes and goes.  No back pain.  Pt denies chest pain or sob.  Recent covid.  Nonsmoker   Pt alert.

## 2020-04-05 NOTE — ED Notes (Signed)
Pt alert  Waiting on admission bed.  Family with pt.

## 2020-04-05 NOTE — Telephone Encounter (Signed)
Symptom wise I don't think he should wait until tomorrow for an appt, I think he should go to UC

## 2020-04-05 NOTE — Telephone Encounter (Signed)
I got note from Dr Glori Bickers and spoke with Leafy Ro RN admin and she said to contact pt or wife and let them know it is not our recommendation that pt wait to be seen on 04/06/20 but if pt is not going to UC or ED I can schedule appt on 04/06/20 at 11:20 with Gentry Fitz NP. I spoke with pts wife and she said has talked with pt since we last talked and pt is not going to UC or ED. Mrs Bussey scheduled in office appt on 04/06/20 with Gentry Fitz NP. Then pt said earlier pt did not have fever but pts wife thinks he did too much today and has low grade fever of 99. Pt has been eating normally today.Gentry Fitz NP said pt should go to UC or ED but if he refuses then pt should be NPO after midnight except sips of water if needed because she may have to order CT if pt comes to office. Mrs Moree voiced understanding and said if pts temp goes over 100 she will take pt to ED. Sending message to Gentry Fitz NP and Dr Glori Bickers. (Dr Alba Cory schedule was full when I called pt back).

## 2020-04-05 NOTE — Telephone Encounter (Signed)
Donzetta Matters front office mgr brought me note that pt was calling for appt in office with fever and abd pain. Raquel Sarna brought the note to me. I spoke with pt and when I advised that pt needed to go to UC or ED for eval and possible testing pt did not say anything else and his wife (DPR signed) was on phone saying that the pt does not have any symptoms and I was giving pt an anxiety attack by telling him to go to an UC or ED. I advised pt's wife that pt had said he has still has abd pain and fever; Mrs Delauder said pt had fever,vomiting and diarrhea on 04/02/20 but has not had any symptoms since the weekend; pt needs to see a doctor for his mental health that he is OK. Pts wife said her son is EMT and does not think pt has appendicitis and might have pulled muscle or developed a hernia due to straining with the vomiting and pts wife assured me if pt needed to be seen at [facility] or ED she would take him. pts wife said she did a home covid test which was negative and pts wife said that pt had covid about 3 wks ago and could not get again this soon. I advised to put covid symptoms aside there are no available appts at [facility] today. Pts wife said to schedule appt for tomorrow with a provider (pts wife is aware Dr Silvio Pate is out of office this wk) and to schedule appt with Dr Silvio Pate on his return 04/11/20. I spoke with Larene Beach RN because I sent the note to Avie Echevaria NP on 04/04/20 in Dr Alla German absence and she noted that she agreed with advice given. pts wife is refusing to take pt to UC or ED because he needs to see a doctor in his doctors office. Larene Beach RN said I could explain to pts wife that I will send a note to DR Tower with this phone note from 04/04/20 and 04/05/20 to see if she could possibly see pt in office or not. No guarantee was made of an appt in office on 04/06/20. UC & ED precautions given to pts wife and she voiced understanding and will wait for cb to pt at [phone]; pts wife will be out of town on 04/06/20.  Pts wife did insist on scheduling appt with Dr Silvio Pate on 04/11/20 at 4 PM. I spoke with Larene Beach CMA to let her be aware of above info; sending note to Dr Glori Bickers for cb on 04/06/20.

## 2020-04-05 NOTE — ED Notes (Signed)
Report messaged to mary ann rn floor nurse °

## 2020-04-05 NOTE — ED Provider Notes (Signed)
[facility] Emergency Department Provider Note   ____________________________________________   I have reviewed the triage vital signs and the nursing notes.   HISTORY  Chief Complaint Abdominal Pain   History limited by: Not Limited   HPI Brendan Bell is a 70 y.o. male who presents to the emergency department today because of concerns for abdominal pain the patient states that the abdominal pain started 4 days ago.  Initially was located more in the center lower abdomen and then moved over to the right side of the abdomen and now is all along the right side of the abdomen.  Patient states he has had some loose watery diarrhea with this as well as a couple episodes of vomiting.  He has had fevers as high as 101 at home.  Patient denies any history of abdominal issues.  Denies any previous surgeries in his abdomen.  Denies any unusual ingestions or travel recently.  Records reviewed. Per medical record review patient has a history of HLD.   Past Medical History:  Diagnosis Date  . Anxiety   . Depression   . Hyperlipidemia   . Osteoarthritis   . Pneumonia 10/04/98   out patient  . Rhinitis, allergic     Patient Active Problem List   Diagnosis Date Noted  . Belching 11/30/2019  . Carotid arterial disease ([place]) 11/18/2019  . Stage 3a chronic kidney disease ([place]) 11/18/2019  . History of TIA (transient ischemic attack) 10/26/2019  . Sensory loss 10/26/2019  . Nonallopathic lesion of thoracic region 08/10/2019  . Nonallopathic lesion of lumbar region 08/10/2019  . Nonallopathic lesion of sacral region 08/10/2019  . Lumbar radiculopathy, right 07/27/2019  . Degenerative disc disease, lumbar 07/23/2019  . Varicocele 06/05/2019  . Advance directive discussed with patient 03/24/2018  . Actinic keratosis 03/24/2018  . OA (osteoarthritis) of hip 01/08/2018  . Right carpal tunnel syndrome 03/30/2016  . Arthritis of foot, left, degenerative 04/25/2015  .  Arthritis of right hip 04/25/2015  . Loss of transverse plantar arch of left foot 04/25/2015  . Osteoarthritis of both knees 10/16/2011  . Routine general medical examination at a health care facility 06/19/2010  . Hyperlipemia 02/20/2007  . MDD (major depressive disorder), recurrent episode, moderate ([place]) 02/20/2007  . ALLERGIC RHINITIS 02/20/2007    Past Surgical History:  Procedure Laterality Date  . COLONOSCOPY    . EYE SURGERY Bilateral   . JOINT REPLACEMENT  2/13///9/13   medial right knee///medial left knee---Dr Jefm Bryant  . KNEE ARTHROSCOPY W/ PARTIAL MEDIAL MENISCECTOMY Right 2008   with pateller chondroplasty ([place])  . KNEE SURGERY  1992   right, loose body removed  . KNEE SURGERY     multiple surgeries to both knees  . MEDIAL PARTIAL KNEE REPLACEMENT Left    Dr. Jefm Bryant  . MEDIAL PARTIAL KNEE REPLACEMENT Right    Dr. Jefm Bryant  . TONSILLECTOMY    . TOTAL HIP ARTHROPLASTY Right 01/08/2018   Procedure: RIGHT TOTAL HIP ARTHROPLASTY ANTERIOR APPROACH;  Surgeon: Gaynelle Arabian, MD;  Location: [facility];  Service: Orthopedics;  Laterality: Right;  132min  . TOTAL KNEE ARTHROPLASTY WITH REVISION COMPONENTS Left 02/21/2015   Procedure: TOTAL KNEE ARTHROPLASTY WITH REVISION COMPONENTS;  Surgeon: Frederik Pear, MD;  Location: [place];  Service: Orthopedics;  Laterality: Left;    Prior to Admission medications   Medication Sig Start Date End Date Taking? Authorizing Provider  acetaminophen (TYLENOL) 650 MG CR tablet Take 650 mg by mouth every 8 (eight) hours as needed for  pain.    [provider]  ARIPiprazole (ABILIFY) 2 MG tablet Take 1-2 tablets (2-4 mg total) by mouth daily. 12/29/19   [name], MD  aspirin EC 81 MG tablet Take 81 mg by mouth every other day. Swallow whole.    [provider]  Cholecalciferol (EQL VITAMIN D3) 50 MCG (2000 UT) CAPS Take by mouth.    [provider]  citalopram (CELEXA) 40 MG tablet Take 40 mg by mouth daily.     [provider]  Magnesium Gluconate 500 (27 Mg) MG TABS Take by mouth.    [provider]  MAGNESIUM PO Take by mouth.    [provider]  Multiple Vitamins-Minerals (MULTIVITAMIN PO) Take 1 tablet by mouth daily.    [provider]  Omega 3 1200 MG CAPS Take by mouth.    [provider]  ZINC GLUCONATE PO Take by mouth.    [provider]    Allergies Patient has no known allergies.  Family History  Problem Relation Age of Onset  . Heart disease Mother        CAD, MI  . Hypertension Mother   . Stroke Father   . Aneurysm Father        died from rupture of AAA  . Cancer Paternal Grandfather        prostate    Social History Social History   Tobacco Use  . Smoking status: Never Smoker  . Smokeless tobacco: Never Used  Vaping Use  . Vaping Use: Never used  Substance Use Topics  . Alcohol use: Yes    Comment: 2 beers a day in general.  . Drug use: No    Review of Systems Constitutional: Positive for fevers.  Eyes: No visual changes. ENT: No sore throat. Cardiovascular: Denies chest pain. Respiratory: Denies shortness of breath. Gastrointestinal: Positive for abdominal pain, diarrhea and vomiting.  Genitourinary: Negative for dysuria. Musculoskeletal: Negative for back pain. Skin: Negative for rash. Neurological: Negative for headaches, focal weakness or numbness.  ____________________________________________   PHYSICAL EXAM:  VITAL SIGNS: ED Triage Vitals  Enc Vitals Group     BP 04/05/20 1850 (!) 146/91     Pulse Rate 04/05/20 1850 93     Resp 04/05/20 1850 18     Temp 04/05/20 1850 99.3 F (37.4 C)     Temp Source 04/05/20 1850 Oral     SpO2 04/05/20 1850 97 %     Weight 04/05/20 1851 177 lb (80.3 kg)     Height 04/05/20 1851 5\' 6"  (1.676 m)     Head Circumference --      Peak Flow --      Pain Score 04/05/20 1851 4   Constitutional: Alert and oriented.  Eyes: Conjunctivae are normal.  ENT       Head: Normocephalic and atraumatic.      Nose: No congestion/rhinnorhea.      Mouth/Throat: Mucous membranes are moist.      Neck: No stridor. Hematological/Lymphatic/Immunilogical: No cervical lymphadenopathy. Cardiovascular: Normal rate, regular rhythm.  No murmurs, rubs, or gallops.  Respiratory: Normal respiratory effort without tachypnea nor retractions. Breath sounds are clear and equal bilaterally. No wheezes/rales/rhonchi. Gastrointestinal: Soft and tender to palpation in the right lower quadrant Genitourinary: Deferred Musculoskeletal: Normal range of motion in all extremities. No lower extremity edema. Neurologic:  Normal speech and language. No gross focal neurologic deficits are appreciated.  Skin:  Skin is warm, dry and intact. No rash noted. Psychiatric: Mood  and affect are normal. Speech and behavior are normal. Patient exhibits appropriate insight and judgment.  ____________________________________________    LABS (pertinent positives/negatives)  Lipase 22 CBC wbc 10.2, hgb 15.5, plt 187 CMP na 127, k 3.7, glu 104, cr 1.06  ____________________________________________   EKG  I, Nance Pear, attending physician, personally viewed and interpreted this EKG  EKG Time: 1857 Rate: 93 Rhythm: normal sinus rhythm Axis: normal Intervals: qtc 465 QRS: Incomplete RBBB ST changes: no st elevation Impression: abnormal ekg  ____________________________________________    RADIOLOGY  CT abd/pel Perforated appendicitis with possible abscess formation  ____________________________________________   PROCEDURES  Procedures  ____________________________________________   INITIAL IMPRESSION / ASSESSMENT AND PLAN / ED COURSE  Pertinent labs & imaging results that were available during my care of the patient were reviewed by me and considered in my medical decision making (see chart for details).   Patient presented to the emergency department today because  of concerns for abdominal pain.  On exam patient is tender in the right lower quadrant.  Patient is afebrile here and does not have leukocytosis however given tenderness do have concern for appendicitis.  CT scan was obtained which is consistent with perforated appendicitis.  I discussed this with the patient.  Also discussed with Dr. Peyton Najjar who is on call for surgery tonight.   ____________________________________________   FINAL CLINICAL IMPRESSION(S) / ED DIAGNOSES  Final diagnoses:  Appendicitis, unspecified appendicitis type     Note: This dictation was prepared with Dragon dictation. Any transcriptional errors that result from this process are unintentional     Nance Pear, MD 04/05/20 2123

## 2020-04-05 NOTE — Telephone Encounter (Signed)
Mrs Cates notified as instructed and she said this is just going to cause pt more anxiety and I tried to explain that no one is trying to cause pt more anxiety we are making recommendations that we feel are the best way to take care of the pt. Mrs Daughdrill said no appt is going to be made for pt on 04/06/20 and I said no Dr Glori Bickers thinks with pts symptoms that he should not wait until tomorrow to be seen. Mrs Mcclintock said they will see Dr Silvio Pate on Monday. 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 I thanked her and hope everything would go well. Sending note to [facility] CMA. Karma Greaser RN and DR Glori Bickers.

## 2020-04-06 ENCOUNTER — Ambulatory Visit: Payer: Medicare Other | Admitting: Primary Care

## 2020-04-06 LAB — CBC
HCT: 41.4 % (ref 39.0–52.0)
Hemoglobin: 14.6 g/dL (ref 13.0–17.0)
MCH: 30.2 pg (ref 26.0–34.0)
MCHC: 35.3 g/dL (ref 30.0–36.0)
MCV: 85.7 fL (ref 80.0–100.0)
Platelets: 161 10*3/uL (ref 150–400)
RBC: 4.83 MIL/uL (ref 4.22–5.81)
RDW: 13.9 % (ref 11.5–15.5)
WBC: 8.7 10*3/uL (ref 4.0–10.5)
nRBC: 0 % (ref 0.0–0.2)

## 2020-04-06 LAB — BASIC METABOLIC PANEL
Anion gap: 6 (ref 5–15)
BUN: 15 mg/dL (ref 8–23)
CO2: 26 mmol/L (ref 22–32)
Calcium: 8.3 mg/dL — ABNORMAL LOW (ref 8.9–10.3)
Chloride: 101 mmol/L (ref 98–111)
Creatinine, Ser: 1.08 mg/dL (ref 0.61–1.24)
GFR, Estimated: >60 mL/min (ref >60)
Glucose, Bld: 92 mg/dL (ref 70–99)
Potassium: 3.7 mmol/L (ref 3.5–5.1)
Sodium: 133 mmol/L — ABNORMAL LOW (ref 135–145)

## 2020-04-06 LAB — MAGNESIUM: Magnesium: 2.1 mg/dL (ref 1.7–2.4)

## 2020-04-06 LAB — HIV ANTIBODY (ROUTINE TESTING W REFLEX): HIV Screen 4th Generation wRfx: NONREACTIVE

## 2020-04-06 MED ORDER — FAMOTIDINE IN NACL 20-0.9 MG/50ML-% IV SOLN
20.0000 mg | Freq: Once | INTRAVENOUS | Status: AC
  Administered 2020-04-06: 20 mg via INTRAVENOUS
  Filled 2020-04-06: qty 50

## 2020-04-06 NOTE — Telephone Encounter (Signed)
It appears he is in the hospital now with appendicitis

## 2020-04-06 NOTE — H&P (Signed)
SURGICAL CONSULTATION NOTE   HISTORY OF PRESENT ILLNESS (HPI):  70 y.o. male presented to [facility] ED for evaluation of abdominal pain since 5 days ago. Patient reports he started having abdominal pain last Friday.  He reports that he then developed diarrhea.  The pain localized to the periumbilical area and radiated to the right side of the abdomen.  Initially he thought that it was a stomach bug so he stayed at home trying to get hydrated.  The pain remained constant.  On Monday he tried to contact the primary care physician for evaluation.  Recommendation was to be seen in the urgent care due to history of fever.  Reports nausea but no vomiting.  At the ED he was found with normal white blood cell count.  CT scan of abdomen and pelvis shows perforated appendicitis with multiple fluid collections around the right colon.  The collection seems to be contained without free air.  I personally evaluated the images of the CT scan.  Surgery is consulted by Dr. Archie Balboa in this context for evaluation and management of perforated appendicitis.  PAST MEDICAL HISTORY (PMH):  Past Medical History:  Diagnosis Date  . Anxiety   . Depression   . Hyperlipidemia   . Osteoarthritis   . Pneumonia 10/04/98   out patient  . Rhinitis, allergic      PAST SURGICAL HISTORY ([place]):  Past Surgical History:  Procedure Laterality Date  . COLONOSCOPY    . EYE SURGERY Bilateral   . JOINT REPLACEMENT  2/13///9/13   medial right knee///medial left knee---Dr Jefm Bryant  . KNEE ARTHROSCOPY W/ PARTIAL MEDIAL MENISCECTOMY Right 2008   with pateller chondroplasty ([place])  . KNEE SURGERY  1992   right, loose body removed  . KNEE SURGERY     multiple surgeries to both knees  . MEDIAL PARTIAL KNEE REPLACEMENT Left    Dr. Jefm Bryant  . MEDIAL PARTIAL KNEE REPLACEMENT Right    Dr. Jefm Bryant  . TONSILLECTOMY    . TOTAL HIP ARTHROPLASTY Right 01/08/2018   Procedure: RIGHT TOTAL HIP ARTHROPLASTY ANTERIOR APPROACH;  Surgeon:  Gaynelle Arabian, MD;  Location: [facility];  Service: Orthopedics;  Laterality: Right;  138min  . TOTAL KNEE ARTHROPLASTY WITH REVISION COMPONENTS Left 02/21/2015   Procedure: TOTAL KNEE ARTHROPLASTY WITH REVISION COMPONENTS;  Surgeon: Frederik Pear, MD;  Location: [place];  Service: Orthopedics;  Laterality: Left;     MEDICATIONS:  Prior to Admission medications   Medication Sig Start Date End Date Taking? Authorizing Provider  acetaminophen (TYLENOL) 650 MG CR tablet Take 650 mg by mouth every 8 (eight) hours as needed for pain.    [provider]  ARIPiprazole (ABILIFY) 2 MG tablet Take 1-2 tablets (2-4 mg total) by mouth daily. 12/29/19   [name], MD  aspirin EC 81 MG tablet Take 81 mg by mouth every other day. Swallow whole.    [provider]  Cholecalciferol (EQL VITAMIN D3) 50 MCG (2000 UT) CAPS Take by mouth.    [provider]  citalopram (CELEXA) 40 MG tablet Take 40 mg by mouth daily.    [provider]  Magnesium Gluconate 500 (27 Mg) MG TABS Take by mouth.    [provider]  MAGNESIUM PO Take by mouth.    [provider]  Multiple Vitamins-Minerals (MULTIVITAMIN PO) Take 1 tablet by mouth daily.    [provider]  Omega 3 1200 MG CAPS Take by mouth.    [provider]  ZINC GLUCONATE PO  Take by mouth.    [provider]     ALLERGIES:  No Known Allergies   SOCIAL HISTORY:  Social History   Socioeconomic History  . Marital status: Married    Spouse name: Not on file  . Number of children: 5  . Years of education: Not on file  . Highest education level: Not on file  Occupational History  . 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 Occupation: Landlord  Tobacco Use  . Smoking status: Never Smoker  . Smokeless tobacco: Never Used  Vaping Use  . Vaping Use: Never used  Substance and Sexual Activity  . Alcohol use: Yes    Comment: 2 beers a day in general.  .  Drug use: No  . Sexual activity: Not on file  Other Topics Concern  . Not on file  Social History Narrative   2nd marriage.  3 children from first marriage, 2 stepchildren, 2 with current marriage.      Has living will   Wife is health care POA--son Hilliard Clark would be alternate   Would accept resuscitation   Would accept tube feeds briefly but not if cognitively unaware   Social Determinants of Health   Financial Resource Strain: Not on file  Food Insecurity: Not on file  Transportation Needs: Not on file  Physical Activity: Not on file  Stress: Not on file  Social Connections: Not on file  Intimate Partner Violence: Not on file      FAMILY HISTORY:  Family History  Problem Relation Age of Onset  . Heart disease Mother        CAD, MI  . Hypertension Mother   . Stroke Father   . Aneurysm Father        died from rupture of AAA  . Cancer Paternal Grandfather        prostate     REVIEW OF SYSTEMS:  Constitutional: denies weight loss, fever, chills, or sweats  Eyes: denies any other vision changes, history of eye injury  ENT: denies sore throat, hearing problems  Respiratory: denies shortness of breath, wheezing  Cardiovascular: denies chest pain, palpitations  Gastrointestinal: Positive abdominal pain, nausea and vomiting Genitourinary: denies burning with urination or urinary frequency Musculoskeletal: denies any other joint pains or cramps  Skin: denies any other rashes or skin discolorations  Neurological: denies any other headache, dizziness, weakness  Psychiatric: denies any other depression, anxiety   All other review of systems were negative   VITAL SIGNS:  Temp:  [98.2 F (36.8 C)-99.3 F (37.4 C)] 98.8 F (37.1 C) (03/09 0411) Pulse Rate:  [78-95] 89 (03/09 0411) Resp:  [18-20] 20 (03/08 2346) BP: (107-151)/(65-91) 126/81 (03/09 0411) SpO2:  [94 %-99 %] 95 % (03/09 0411) Weight:  [80.3 kg-80.4 kg] 80.4 kg (03/08 2345)     Height: 5' 5.98" (167.6 cm)  Weight: 80.4 kg BMI (Calculated): 28.62   INTAKE/OUTPUT:  This shift: Total I/O In: -  Out: 500 [Urine:500]  Last 2 shifts: @IOLAST2SHIFTS @   PHYSICAL EXAM:  Constitutional:  -- Normal body habitus  -- Awake, alert, and oriented x3  Eyes:  -- Pupils equally round and reactive to light  -- No scleral icterus  Ear, nose, and throat:  -- No jugular venous distension  Pulmonary:  -- No crackles  -- Equal breath sounds bilaterally -- Breathing non-labored at rest Cardiovascular:  -- S1, S2 present  -- No pericardial rubs Gastrointestinal:  -- Abdomen soft, tender to palpation in  the right abdomen upper and lower quadrant, non-distended, no guarding or rebound tenderness -- No abdominal masses appreciated, pulsatile or otherwise  Musculoskeletal and Integumentary:  -- Wounds: None appreciated -- Extremities: B/L UE and LE FROM, hands and feet warm, no edema  Neurologic:  -- Motor function: intact and symmetric -- Sensation: intact and symmetric   Labs:  CBC Latest Ref Rng & Units 04/06/2020 04/05/2020 10/26/2019  WBC 4.0 - 10.5 K/uL 8.7 10.2 8.4  Hemoglobin 13.0 - 17.0 g/dL 14.6 15.5 16.8  Hematocrit 39.0 - 52.0 % 41.4 44.1 50.3  Platelets 150 - 400 K/uL 161 187 179.0   CMP Latest Ref Rng & Units 04/06/2020 04/05/2020 11/18/2019  Glucose 70 - 99 mg/dL 92 104(H) 81  BUN 8 - 23 mg/dL 15 19 18   Creatinine 0.61 - 1.24 mg/dL 1.08 1.06 1.13  Sodium 135 - 145 mmol/L 133(L) 127(L) 139  Potassium 3.5 - 5.1 mmol/L 3.7 3.7 4.2  Chloride 98 - 111 mmol/L 101 94(L) 104  CO2 22 - 32 mmol/L 26 23 30   Calcium 8.9 - 10.3 mg/dL 8.3(L) 8.8(L) 9.3  Total Protein 6.5 - 8.1 g/dL - 7.0 -  Total Bilirubin 0.3 - 1.2 mg/dL - 1.3(H) -  Alkaline Phos 38 - 126 U/L - 115 -  AST 15 - 41 U/L - 37 -  ALT 0 - 44 U/L - 30 -    Imaging studies:  EXAM: CT ABDOMEN AND PELVIS WITH CONTRAST  TECHNIQUE: Multidetector CT imaging of the abdomen and pelvis was performed using the standard protocol following  bolus administration of intravenous contrast.  CONTRAST:  175mL OMNIPAQUE IOHEXOL 300 MG/ML  SOLN  COMPARISON:  None.  FINDINGS: Lower chest: No acute abnormality.  Bibasilar atelectasis/scarring.  Hepatobiliary: No focal liver abnormality is seen. No gallstones, gallbladder wall thickening, or biliary dilatation.  Pancreas: Unremarkable. No pancreatic ductal dilatation or surrounding inflammatory changes.  Spleen: Normal in size without focal abnormality.  Adrenals/Urinary Tract: Adrenal glands are unremarkable. Kidneys are normal, without renal calculi, focal lesion, or hydronephrosis. Bladder is unremarkable.  Stomach/Bowel: The base of the appendix is dilated and thickened, measuring up to 1.4 cm in diameter. The distal appendix is ill-defined and there are small foci of extraluminal air in the right abdomen with scattered loculated fluid (series 2, images 42 and 47). Slightly more defined developing rim enhancing fluid collection at the inferior tip of the liver measuring approximately 4.8 x 2.8 x 4.1 cm (series 2, image 40). Reactive wall thickening of the cecum. No obstruction. The stomach is within normal limits.  Vascular/Lymphatic: Aortic atherosclerosis. No enlarged abdominal or pelvic lymph nodes.  Reproductive: Mild prostatomegaly.  Other: No abdominal wall hernia or abnormality.  Musculoskeletal: No acute or significant osseous findings. Prior right total hip arthroplasty.  IMPRESSION: 1. Perforated appendicitis with scattered loculated fluid and small foci of extraluminal air in the right abdomen. Slightly more defined developing 4.8 x 2.8 x 4.1 cm abscess at the inferior tip of the liver. 2. Aortic Atherosclerosis (ICD10-I70.0).   Electronically Signed   By: Titus Dubin M.D.   On: 04/05/2020 21:03  Assessment/Plan:  70 y.o. male with perforated appendicitis, contained, complicated by pertinent comorbidities including  hyperlipidemia, depression, anxiety.  Patient with perforated appendicitis with multiple contained abscesses.  There is no free air in the abdomen.  Physical exam pain seems to be contained in the right side of the abdomen.  There is no generalized peritonitis.  With the patient vital signs, adequate labs, and physical exam  localized to the right quadrant I think that is reasonable to proceed with conservative management with IV antibiotic therapy.  I will discuss with IR if these abscesses are amenable for drainage at this moment.  I have asked since that since there are multiple small ones he will need to have a repeated CT to see if this abscess contain in a 1 week or abscess that is easier for percutaneous drainage.  I discussed with the patient that performing a surgical intervention at this moment is very high risk for injury and causing enterotomies which might complicate the clinical scenario that is stable at this moment.  The patient understood and agreed to proceed.  We will keep patient n.p.o. today in case IR can drain his abscess.   [name], MD

## 2020-04-06 NOTE — Telephone Encounter (Signed)
Patient's wife called in to cancel all office appts as the patient is hospitalized. EM

## 2020-04-06 NOTE — Plan of Care (Signed)
  Problem: Clinical Measurements: Goal: Ability to maintain clinical measurements within normal limits will improve Outcome: Progressing Goal: Diagnostic test results will improve Outcome: Progressing Goal: Respiratory complications will improve Outcome: Progressing Goal: Cardiovascular complication will be avoided Outcome: Progressing   Problem: Pain Managment: Goal: General experience of comfort will improve Outcome: Progressing   Patient is involved in and agrees with the plan of care. V/S stable. Reports mild pain on his abdomen. Norco given with some relief.

## 2020-04-06 NOTE — Progress Notes (Signed)
Patient ID: Brendan Bell, male   DOB: December 13, 1950, 70 y.o.   MRN: [id number]     [name] Hospital Day(s): 1.   Post op day(s):  Marland Kitchen   Interval History: Patient seen and examined, no acute events or new complaints overnight. Patient reports feeling a little bit better today.  He reported the pain continued to be controlled with antibiotics and current pain medications.  There is no pain radiation.  Alleviating factor has been pain medications antibiotic therapy.  There has been no aggravating factors at this moment.  Patient denies any fever or chills.  Patient denies any nausea or vomiting.  Vital signs in last 24 hours: [min-max] current  Temp:  [98.2 F (36.8 C)-99.3 F (37.4 C)] 98.8 F (37.1 C) (03/09 0411) Pulse Rate:  [78-95] 89 (03/09 0411) Resp:  [18-20] 20 (03/08 2346) BP: (107-151)/(65-91) 126/81 (03/09 0411) SpO2:  [94 %-99 %] 95 % (03/09 0411) Weight:  [80.3 kg-80.4 kg] 80.4 kg (03/08 2345)     Height: 5' 5.98" (167.6 cm) Weight: 80.4 kg BMI (Calculated): 28.62   Physical Exam:  Constitutional: alert, cooperative and no distress  Respiratory: breathing non-labored at rest  Cardiovascular: regular rate and sinus rhythm  Gastrointestinal: soft, non-tender, and non-distended  Labs:  CBC Latest Ref Rng & Units 04/06/2020 04/05/2020 10/26/2019  WBC 4.0 - 10.5 K/uL 8.7 10.2 8.4  Hemoglobin 13.0 - 17.0 g/dL 14.6 15.5 16.8  Hematocrit 39.0 - 52.0 % 41.4 44.1 50.3  Platelets 150 - 400 K/uL 161 187 179.0   CMP Latest Ref Rng & Units 04/06/2020 04/05/2020 11/18/2019  Glucose 70 - 99 mg/dL 92 104(H) 81  BUN 8 - 23 mg/dL 15 19 18   Creatinine 0.61 - 1.24 mg/dL 1.08 1.06 1.13  Sodium 135 - 145 mmol/L 133(L) 127(L) 139  Potassium 3.5 - 5.1 mmol/L 3.7 3.7 4.2  Chloride 98 - 111 mmol/L 101 94(L) 104  CO2 22 - 32 mmol/L 26 23 30   Calcium 8.9 - 10.3 mg/dL 8.3(L) 8.8(L) 9.3  Total Protein 6.5 - 8.1 g/dL - 7.0 -  Total Bilirubin 0.3 - 1.2 mg/dL - 1.3(H) -  Alkaline Phos 38 -  126 U/L - 115 -  AST 15 - 41 U/L - 37 -  ALT 0 - 44 U/L - 30 -    Imaging studies: No new pertinent imaging studies   Assessment/Plan:  70 y.o. male with perforated appendicitis, contained, complicated by pertinent comorbidities including hyperlipidemia, depression, anxiety.  Patient today continues to be stable.  There has been no fever or tachycardia in the last 24 hours.  White blood cells remain within normal limits.  Physical exam without any concerning generalized peritonitis which actually improved pain on the right side of the abdomen.  I think this patient responding adequately to IV antibiotic therapy.  I discussed the CT scan with interventional radiologist who agrees that the abscess are denied amenable for drainage had involvement and needs to be more contained in a rim-enhancing capsule which is not apparent at this moment.  We will continue with IV antibiotic therapy.  We will start clear liquid diet.  We will continue to follow with physical exam and will repeat CT scan in 48 to 72 hours as recommended by IR.  [name], MD

## 2020-04-07 ENCOUNTER — Inpatient Hospital Stay: Payer: Medicare Other

## 2020-04-07 MED ORDER — IOHEXOL 300 MG/ML  SOLN
100.0000 mL | Freq: Once | INTRAMUSCULAR | Status: AC | PRN
  Administered 2020-04-07: 100 mL via INTRAVENOUS

## 2020-04-07 MED ORDER — IOHEXOL 9 MG/ML PO SOLN
500.0000 mL | ORAL | Status: AC
  Administered 2020-04-07 (×2): 500 mL via ORAL

## 2020-04-07 MED ORDER — SODIUM CHLORIDE 0.9 % IV SOLN: INTRAVENOUS | Status: DC | PRN

## 2020-04-07 NOTE — Progress Notes (Signed)
Patient ID: MAMOUDOU MULVEHILL, male   DOB: 03/04/1950, 70 y.o.   MRN: [id number]     [place] Hospital Day(s): 2.   Post op day(s):  Marland Kitchen   Interval History: Patient seen and examined.  Patient today reports continued having some soreness on the right side of the abdomen.  He reported feeling to be more bloated and distended.  He denies any fever or chills.  He reported passing gas rectally.  Vital signs in last 24 hours: [min-max] current  Temp:  [98 F (36.7 C)-99.2 F (37.3 C)] 99.2 F (37.3 C) (03/10 1234) Pulse Rate:  [78-86] 84 (03/10 1234) Resp:  [16-18] 18 (03/10 1234) BP: (127-144)/(78-88) 133/85 (03/10 1234) SpO2:  [95 %-98 %] 97 % (03/10 1234)     Height: 5' 5.98" (167.6 cm) Weight: 80.4 kg BMI (Calculated): 28.62   Physical Exam:  Constitutional: alert, cooperative and no distress  Respiratory: breathing non-labored at rest  Cardiovascular: regular rate and sinus rhythm  Gastrointestinal: soft, mild tender, and mild-distended  Labs:  CBC Latest Ref Rng & Units 04/06/2020 04/05/2020 10/26/2019  WBC 4.0 - 10.5 K/uL 8.7 10.2 8.4  Hemoglobin 13.0 - 17.0 g/dL 14.6 15.5 16.8  Hematocrit 39.0 - 52.0 % 41.4 44.1 50.3  Platelets 150 - 400 K/uL 161 187 179.0   CMP Latest Ref Rng & Units 04/06/2020 04/05/2020 11/18/2019  Glucose 70 - 99 mg/dL 92 104(H) 81  BUN 8 - 23 mg/dL 15 19 18   Creatinine 0.61 - 1.24 mg/dL 1.08 1.06 1.13  Sodium 135 - 145 mmol/L 133(L) 127(L) 139  Potassium 3.5 - 5.1 mmol/L 3.7 3.7 4.2  Chloride 98 - 111 mmol/L 101 94(L) 104  CO2 22 - 32 mmol/L 26 23 30   Calcium 8.9 - 10.3 mg/dL 8.3(L) 8.8(L) 9.3  Total Protein 6.5 - 8.1 g/dL - 7.0 -  Total Bilirubin 0.3 - 1.2 mg/dL - 1.3(H) -  Alkaline Phos 38 - 126 U/L - 115 -  AST 15 - 41 U/L - 37 -  ALT 0 - 44 U/L - 30 -    Imaging studies: No new pertinent imaging studies   Assessment/Plan:  70 y.o.malewith perforated appendicitis, contained, complicated by pertinent comorbidities  includinghyperlipidemia, depression, anxiety.  Patient today without any clinical deterioration.  Patient without fever.  Patient still active with pain control.  Since it was a little bit more distended and his temp went up to 99.9 I will plan to do the CT scan tonight in case there is a drainable collection it can be ready to be drained.  We will continue with IV antibiotic therapy.  We will change to n.p.o. after midnight in case there is something that can be drained.  We will follow up closely.  [name], MD

## 2020-04-08 ENCOUNTER — Inpatient Hospital Stay: Payer: Medicare Other

## 2020-04-08 MED ORDER — FENTANYL CITRATE (PF) 100 MCG/2ML IJ SOLN
INTRAMUSCULAR | Status: AC | PRN
  Administered 2020-04-08 (×2): 50 ug via INTRAVENOUS

## 2020-04-08 MED ORDER — MIDAZOLAM HCL 2 MG/2ML IJ SOLN
INTRAMUSCULAR | Status: AC | PRN
  Administered 2020-04-08 (×2): 1 mg via INTRAVENOUS

## 2020-04-08 MED ORDER — ENOXAPARIN SODIUM 40 MG/0.4ML ~~LOC~~ SOLN
40.0000 mg | SUBCUTANEOUS | Status: DC
  Administered 2020-04-09 – 2020-04-12 (×4): 40 mg via SUBCUTANEOUS
  Filled 2020-04-08 (×4): qty 0.4

## 2020-04-08 MED ORDER — ACETAMINOPHEN 650 MG RE SUPP: 650.0000 mg | Freq: Four times a day (QID) | RECTAL | Status: DC | PRN

## 2020-04-08 MED ORDER — FENTANYL CITRATE (PF) 100 MCG/2ML IJ SOLN
INTRAMUSCULAR | Status: AC
  Filled 2020-04-08: qty 2

## 2020-04-08 MED ORDER — ACETAMINOPHEN 500 MG PO TABS
1000.0000 mg | ORAL_TABLET | Freq: Four times a day (QID) | ORAL | Status: DC | PRN
  Administered 2020-04-09 (×2): 1000 mg via ORAL
  Filled 2020-04-08 (×2): qty 2

## 2020-04-08 MED ORDER — MIDAZOLAM HCL 2 MG/2ML IJ SOLN
INTRAMUSCULAR | Status: AC
  Filled 2020-04-08: qty 2

## 2020-04-08 NOTE — Consult Note (Signed)
Brief Pharmacy Note  Anticoagulation / antiplatelet / inpatient medication list review  Indication:Post-IR abscess drain placement (x 2) for ruptured appendicitis with multiple abscesses  Bleeding risk: Standard  Medications: Lovenox 40 mg daily (on hold for procedure)  A/P: Okay to resume Lovenox tomorrow AM  Benita Gutter 04/06/2020,3:52 PM

## 2020-04-08 NOTE — Care Management Important Message (Signed)
Important Message  Patient Details  Name: Brendan Bell MRN: [id number] Date of Birth: [birth year]   Medicare Important Message Given:  Yes     [name] 04/08/2020, 11:15 AM

## 2020-04-08 NOTE — Plan of Care (Signed)
Patient is involved in and agrees with the plan of care. V/S stable. Denies any pain or nausea. Passing gas but no BM yet.

## 2020-04-08 NOTE — Procedures (Signed)
Interventional Radiology Procedure Note  Procedure: Abscess drain placement x2   Indication: Ruptured appendicitis with multiple abscesses  Findings:  10 fr drain placed in RUQ abscess 10 fr drain placed in Right pelvic abscess  Please refer to procedural dictation for full description.  Complications: None  EBL: < 10 mL  Miachel Roux, MD [phone]

## 2020-04-08 NOTE — Progress Notes (Signed)
Pt with bleeding from right lower abdomen.  JP draining light brown, purulent drainage and temp 103.1.  MUSE score elevated to yellow.  Dr Windell Moment informed of changes and he came to see the pt and he talked to the pt and wife.  Tylenol dose increased.

## 2020-04-08 NOTE — Consult Note (Signed)
Chief Complaint: Patient was seen in consultation today for  Chief Complaint  Patient presents with  . Abdominal Pain    Referring Physician(s): Dr. Windell Moment  Supervising Physician: Mir, Sharen Heck  Patient Status: [facility] - In-pt  History of Present Illness: Brendan Bell is a 70 y.o. male with a medical history significant for anxiety/depression and osteoarthritis. He presented to the [facility] ED on 04/05/20 for evaluation of worsening abdominal pain for approximately 5 days. The pain was localized to the peri-umbilical area and then began to radiate to the right side. Lab work up in the ED was positive for leukocytosis and image findings were consistent with perforated appendicitis with multiple fluid collections.    CT abdomen/pelvis with contrast 04/07/20 IMPRESSION: 1. Again seen findings consistent with perforated appendicitis. 2. Abscess inferior to the liver tip has slightly increased in size. There are multiple new organized fluid collections. The previous ill-defined air fluid collection in the right abdomen is now organized and measures 8.3 x 3.8 x 3.5 cm. New rim enhancing fluid collections in the right anterior pelvis and deep pelvis are more defined than on prior exam. 3. Development of small right pleural effusion and right basilar atelectasis. 4. Aortic Atherosclerosis (ICD10-I70.0).  Interventional Radiology has been asked to evaluate this patient for image-guided intra-abdominal fluid collection aspirations with drain placements. This case has been reviewed and procedure approved by Dr. Dwaine Gale.   Past Medical History:  Diagnosis Date  . Anxiety   . Depression   . Hyperlipidemia   . Osteoarthritis   . Pneumonia 10/04/98   out patient  . Rhinitis, allergic     Past Surgical History:  Procedure Laterality Date  . COLONOSCOPY    . EYE SURGERY Bilateral   . JOINT REPLACEMENT  2/13///9/13   medial right knee///medial left knee---Dr Jefm Bryant  . KNEE ARTHROSCOPY  W/ PARTIAL MEDIAL MENISCECTOMY Right 2008   with pateller chondroplasty ([place])  . KNEE SURGERY  1992   right, loose body removed  . KNEE SURGERY     multiple surgeries to both knees  . MEDIAL PARTIAL KNEE REPLACEMENT Left    Dr. Jefm Bryant  . MEDIAL PARTIAL KNEE REPLACEMENT Right    Dr. Jefm Bryant  . TONSILLECTOMY    . TOTAL HIP ARTHROPLASTY Right 01/08/2018   Procedure: RIGHT TOTAL HIP ARTHROPLASTY ANTERIOR APPROACH;  Surgeon: Gaynelle Arabian, MD;  Location: [facility];  Service: Orthopedics;  Laterality: Right;  154min  . TOTAL KNEE ARTHROPLASTY WITH REVISION COMPONENTS Left 02/21/2015   Procedure: TOTAL KNEE ARTHROPLASTY WITH REVISION COMPONENTS;  Surgeon: Frederik Pear, MD;  Location: [place];  Service: Orthopedics;  Laterality: Left;    Allergies: Patient has no known allergies.  Medications: Prior to Admission medications   Medication Sig Start Date End Date Taking? Authorizing Provider  acetaminophen (TYLENOL) 650 MG CR tablet Take 650 mg by mouth every 8 (eight) hours as needed for pain.   Yes [provider]  ARIPiprazole (ABILIFY) 2 MG tablet Take 1-2 tablets (2-4 mg total) by mouth daily. Patient taking differently: Take 2 mg by mouth at bedtime. 12/29/19  Yes [name] I, MD  aspirin EC 81 MG tablet Take 81 mg by mouth daily. Swallow whole.   Yes [provider]  citalopram (CELEXA) 40 MG tablet Take 40 mg by mouth daily.   Yes [provider]  Magnesium Gluconate 500 (27 Mg) MG TABS Take 500 mg by mouth once a week.   Yes [provider]  Melatonin 10 MG CAPS Take  10 mg by mouth at bedtime.   Yes [provider]  Multiple Vitamins-Minerals (MULTIVITAMIN PO) Take 1 tablet by mouth once a week.   Yes [provider]  Omega 3 1200 MG CAPS Take 1,200 mg by mouth once a week.   Yes [provider]     Family History  Problem Relation Age of Onset  . Heart disease Mother        CAD, MI  . Hypertension Mother   .  Stroke Father   . Aneurysm Father        died from rupture of AAA  . Cancer Paternal Grandfather        prostate    Social History   Socioeconomic History  . Marital status: Married    Spouse name: Not on file  . Number of children: 5  . Years of education: Not on file  . Highest education level: Not on file  Occupational History  . 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 Occupation: Landlord  Tobacco Use  . Smoking status: Never Smoker  . Smokeless tobacco: Never Used  Vaping Use  . Vaping Use: Never used  Substance and Sexual Activity  . Alcohol use: Yes    Comment: 2 beers a day in general.  . Drug use: No  . Sexual activity: Not on file  Other Topics Concern  . Not on file  Social History Narrative   2nd marriage.  3 children from first marriage, 2 stepchildren, 2 with current marriage.      Has living will   Wife is health care POA--son Hilliard Clark would be alternate   Would accept resuscitation   Would accept tube feeds briefly but not if cognitively unaware   Social Determinants of Health   Financial Resource Strain: Not on file  Food Insecurity: Not on file  Transportation Needs: Not on file  Physical Activity: Not on file  Stress: Not on file  Social Connections: Not on file    Review of Systems: A 12 point ROS discussed and pertinent positives are indicated in the HPI above.  All other systems are negative.  Review of Systems  Constitutional: Positive for appetite change and fatigue.  Respiratory: Negative for cough and shortness of breath.   Cardiovascular: Negative for chest pain and leg swelling.  Gastrointestinal: Positive for abdominal pain and diarrhea. Negative for nausea and vomiting.  Neurological: Negative for dizziness and headaches.    Vital Signs: BP (!) 142/79 (BP Location: Right Arm)   Pulse 76   Temp 99.1 F (37.3 C) (Oral)   Resp 18   Ht 5' 5.98" (1.676 m)   Wt 177 lb 4 oz (80.4 kg)   SpO2 97%   BMI  28.62 kg/m   Physical Exam Constitutional:      General: He is not in acute distress.    Appearance: He is not ill-appearing.  HENT:     Mouth/Throat:     Mouth: Mucous membranes are moist.     Pharynx: Oropharynx is clear.  Cardiovascular:     Rate and Rhythm: Normal rate and regular rhythm.     Pulses: Normal pulses.     Heart sounds: Normal heart sounds.  Pulmonary:     Effort: Pulmonary effort is normal.     Breath sounds: Normal breath sounds.  Abdominal:     General: Bowel sounds are normal.     Palpations: Abdomen is soft.     Tenderness: There is abdominal  tenderness.     Comments: Right-sided/mid abdominal pain   Musculoskeletal:        General: Normal range of motion.  Skin:    General: Skin is warm and dry.  Neurological:     Mental Status: He is alert and oriented to person, place, and time.  Psychiatric:        Mood and Affect: Mood normal.        Behavior: Behavior normal.        Thought Content: Thought content normal.        Judgment: Judgment normal.     Imaging: CT ABDOMEN PELVIS W CONTRAST  Result Date: 04/07/2020 CLINICAL DATA:  Abdominal abscess/infection suspected Recent perforated appendicitis. EXAM: CT ABDOMEN AND PELVIS WITH CONTRAST TECHNIQUE: Multidetector CT imaging of the abdomen and pelvis was performed using the standard protocol following bolus administration of intravenous contrast. CONTRAST:  130mL OMNIPAQUE IOHEXOL 300 MG/ML  SOLN COMPARISON:  CT 04/05/2020 FINDINGS: Lower chest: Development of small right pleural effusion and right basilar atelectasis. Scattered subsegmental atelectasis in the left lower lobe. Hepatobiliary: No focal hepatic abnormality. Distended gallbladder. No calcified gallstone. No pericholecystic fat stranding. No biliary dilatation. Pancreas: No ductal dilatation or inflammation. Spleen: Normal in size without focal abnormality. Adrenals/Urinary Tract: No adrenal nodule. No hydronephrosis or perinephric edema.  Homogeneous renal enhancement with symmetric excretion on delayed phase imaging. Urinary bladder is physiologically distended without wall thickening. Stomach/Bowel: Again seen findings consistent perforated appendicitis. Appendix measures 14 mm with wall hyperemia and periappendiceal fat stranding. Multiple fluid collections as described below. There is mild wall thickening of the cecum and proximal ascending colon. Occasional thickening of right lower quadrant small bowel loops likely reactive. No obstruction, administered enteric contrast is seen throughout the colon. There is no extraluminal contrast. Small hiatal hernia. Vascular/Lymphatic: Aortic atherosclerosis. No aneurysm. No portal venous or mesenteric thrombus. No mesenteric gas. No bulky abdominopelvic adenopathy. Reproductive: Prominent size, partially obscured by streak artifact from right hip arthroplasty. Other: Again seen multiple fluid collections consistent with abscess. Subhepatic collection in the right pericolic gutter measures 5.3 x 2.9 cm, series 2, image 42, previously 4.8 x 2.8 cm. The previous ill-defined air fluid collection in the right anterior abdomen is becoming better defined, currently measuring 8.3 x 3.8 x 3.5 cm (CC xTR x AP), series 5, image 24 and series 2, image 51. There is a contiguous collection extending inferiorly into the anterior right pelvis. Deep pelvic fluid collection located anterior to the rectum measures 5.7 x 3.5 cm and has some peripheral enhancement. Right pelvic collection with peripheral enhancement measures 4.2 x 2.7 cm, series 2, image 72. Additional small foci of interloop fluid in the right lower quadrant without rim enhancement. There is persistent fat stranding and inflammation in the right lower quadrant. No pneumoperitoneum or free air throughout the abdomen. Musculoskeletal: Multilevel facet hypertrophy and degenerative disc disease in the lumbar spine. Right hip arthroplasty. IMPRESSION: 1. Again  seen findings consistent with perforated appendicitis. 2. Abscess inferior to the liver tip has slightly increased in size. There are multiple new organized fluid collections. The previous ill-defined air fluid collection in the right abdomen is now organized and measures 8.3 x 3.8 x 3.5 cm. New rim enhancing fluid collections in the right anterior pelvis and deep pelvis are more defined than on prior exam. 3. Development of small right pleural effusion and right basilar atelectasis. Aortic Atherosclerosis (ICD10-I70.0). Electronically Signed   By: Keith Rake M.D.   On: 04/07/2020 21:58   CT  ABDOMEN PELVIS W CONTRAST  Result Date: 04/05/2020 CLINICAL DATA:  Intermittent lower abdominal pain with fever and diarrhea for the past 5 days. EXAM: CT ABDOMEN AND PELVIS WITH CONTRAST TECHNIQUE: Multidetector CT imaging of the abdomen and pelvis was performed using the standard protocol following bolus administration of intravenous contrast. CONTRAST:  17mL OMNIPAQUE IOHEXOL 300 MG/ML  SOLN COMPARISON:  None. FINDINGS: Lower chest: No acute abnormality.  Bibasilar atelectasis/scarring. Hepatobiliary: No focal liver abnormality is seen. No gallstones, gallbladder wall thickening, or biliary dilatation. Pancreas: Unremarkable. No pancreatic ductal dilatation or surrounding inflammatory changes. Spleen: Normal in size without focal abnormality. Adrenals/Urinary Tract: Adrenal glands are unremarkable. Kidneys are normal, without renal calculi, focal lesion, or hydronephrosis. Bladder is unremarkable. Stomach/Bowel: The base of the appendix is dilated and thickened, measuring up to 1.4 cm in diameter. The distal appendix is ill-defined and there are small foci of extraluminal air in the right abdomen with scattered loculated fluid (series 2, images 42 and 47). Slightly more defined developing rim enhancing fluid collection at the inferior tip of the liver measuring approximately 4.8 x 2.8 x 4.1 cm (series 2, image 40).  Reactive wall thickening of the cecum. No obstruction. The stomach is within normal limits. Vascular/Lymphatic: Aortic atherosclerosis. No enlarged abdominal or pelvic lymph nodes. Reproductive: Mild prostatomegaly. Other: No abdominal wall hernia or abnormality. Musculoskeletal: No acute or significant osseous findings. Prior right total hip arthroplasty. IMPRESSION: 1. Perforated appendicitis with scattered loculated fluid and small foci of extraluminal air in the right abdomen. Slightly more defined developing 4.8 x 2.8 x 4.1 cm abscess at the inferior tip of the liver. 2. Aortic Atherosclerosis (ICD10-I70.0). Electronically Signed   By: Titus Dubin M.D.   On: 04/05/2020 21:03    Labs:  CBC: Recent Labs    04/29/19 0858 10/26/19 1406 04/05/20 1854 04/06/20 0424  WBC 6.2 8.4 10.2 8.7  HGB 16.5 16.8 15.5 14.6  HCT 49.4 50.3 44.1 41.4  PLT 185.0 179.0 187 161    COAGS: No results for input(s): INR, APTT in the last 8760 hours.  BMP: Recent Labs    10/26/19 1406 11/18/19 0825 04/05/20 1854 04/06/20 0424  NA 140 139 127* 133*  K 4.1 4.2 3.7 3.7  CL 107 104 94* 101  CO2 25 30 23 26   GLUCOSE 94 81 104* 92  BUN 24* 18 19 15   CALCIUM 9.5 9.3 8.8* 8.3*  CREATININE 1.44 1.13 1.06 1.08  GFRNONAA  --   --  >60 >60    LIVER FUNCTION TESTS: Recent Labs    04/29/19 0858 10/26/19 1406 11/18/19 0825 04/05/20 1854  BILITOT 0.7 1.0  --  1.3*  AST 22 22  --  37  ALT 18 19  --  30  ALKPHOS 60 51  --  115  PROT 6.7 6.8  --  7.0  ALBUMIN 4.3 4.3 4.1 3.3*    TUMOR MARKERS: No results for input(s): AFPTM, CEA, CA199, CHROMGRNA in the last 8760 hours.  Assessment and Plan:  Perforated appendicitis with abscess collections: Brendan Bell. Brendan Bell, 70 year old male, presents today to the [facility] Interventional Radiology department for image-guided intra-abdominal fluid collection aspirations with drain placements.  Risks and benefits discussed with the  patient including bleeding, infection, damage to adjacent structures, bowel perforation/fistula connection, and sepsis.  All of the patient's questions were answered, patient is agreeable to proceed. He has been NPO. Labs and vitals have been reviewed.   Consent signed and in chart.  Thank you for  this interesting consult.  I greatly enjoyed meeting Brendan Bell and look forward to participating in their care.  A copy of this report was sent to the requesting provider on this date.  Electronically Signed: Soyla Dryer, AGACNP-BC [phone] 04/08/2020, 12:03 PM   I spent a total of 20 Minutes    in face to face in clinical consultation, greater than 50% of which was counseling/coordinating care for intra-abdominal abscess drains.

## 2020-04-08 NOTE — Progress Notes (Signed)
Patient clinically stable post x2 10 FR drain placement per Dr Dossie Der well. Vitals stable pre and post procedure. Denies complaints at this time. Bedside report given to care nurse. Wife at bedside. Received Versed 2 mg along with Fentanyl 100 mcg IV for procedure.

## 2020-04-08 NOTE — Progress Notes (Signed)
Patient ID: Brendan Bell, male   DOB: [birth year], 70 y.o.   MRN: [id number]     [place] [facility] Day(s): 3.   Post op day(s):  Marland Kitchen   Interval History: Patient seen and examined, no acute events or new complaints overnight. Patient reports no change in the abdominal physical exam.  No significant worsening of his abdominal pain.  There has been no fever.  No IV no nausea or vomiting.  Patient passing gas.  No pain radiation.  No alleviating or aggravating factors.  Vital signs in last 24 hours: [min-max] current  Temp:  [98.3 F (36.8 C)-99.9 F (37.7 C)] 98.7 F (37.1 C) (03/11 0734) Pulse Rate:  [75-84] 77 (03/11 0734) Resp:  [16-18] 16 (03/11 0734) BP: (127-143)/(77-87) 143/77 (03/11 0734) SpO2:  [96 %-99 %] 98 % (03/11 0734)     Height: 5' 5.98" (167.6 cm) Weight: 80.4 kg BMI (Calculated): 28.62   Physical Exam:  Constitutional: alert, cooperative and no distress  Respiratory: breathing non-labored at rest  Cardiovascular: regular rate and sinus rhythm  Gastrointestinal: soft, non-tender, and non-distended  Labs:  CBC Latest Ref Rng & Units 04/06/2020 04/05/2020 10/26/2019  WBC 4.0 - 10.5 K/uL 8.7 10.2 8.4  Hemoglobin 13.0 - 17.0 g/dL 14.6 15.5 16.8  Hematocrit 39.0 - 52.0 % 41.4 44.1 50.3  Platelets 150 - 400 K/uL 161 187 179.0   CMP Latest Ref Rng & Units 04/06/2020 04/05/2020 11/18/2019  Glucose 70 - 99 mg/dL 92 104(H) 81  BUN 8 - 23 mg/dL 15 19 18   Creatinine 0.61 - 1.24 mg/dL 1.08 1.06 1.13  Sodium 135 - 145 mmol/L 133(L) 127(L) 139  Potassium 3.5 - 5.1 mmol/L 3.7 3.7 4.2  Chloride 98 - 111 mmol/L 101 94(L) 104  CO2 22 - 32 mmol/L 26 23 30   Calcium 8.9 - 10.3 mg/dL 8.3(L) 8.8(L) 9.3  Total Protein 6.5 - 8.1 g/dL - 7.0 -  Total Bilirubin 0.3 - 1.2 mg/dL - 1.3(H) -  Alkaline Phos 38 - 126 U/L - 115 -  AST 15 - 41 U/L - 37 -  ALT 0 - 44 U/L - 30 -    Imaging studies: I personally evaluated the images of the CT scan done yesterday night.  I identified  multiple intra-abdominal abscess.  I discussed the images with interventional radiologist today.  These abscesses are amenable for drainage.  No free air   Assessment/Plan:  70 y.o.malewith perforated appendicitis, contained, complicated by pertinent comorbidities includinghyperlipidemia, depression, anxiety.  Patient had follow-up CT scan last night.  This shows migration of the abscesses with more rim-enhancing wall.  I discussed the CT with interventional radiologist who agreed to proceed with percutaneous drainage.  Patient is stable.  No significant peritonitis.  We will continue with IV antibiotic therapy.  Will restart diet after procedure.  I encouraged the patient to ambulate.  [name], MD

## 2020-04-09 NOTE — Progress Notes (Signed)
Patient ID: Brendan Bell, male   DOB: 04/07/1950, 70 y.o.   MRN: [id number]      [facility] Day(s): 4.   Post op day(s):  Marland Kitchen   Interval History: Patient seen and examined, no acute events or new complaints overnight. Patient reports feeling much comfortable today.  He reported the abdomen feels better.  He denied nausea or vomiting.  He reported passing gas.  Pain still localized in the right abdomen.  There is no pain radiation.  Alleviating factor has been pain medication.  There has been no aggravating factors.  Yesterday had one episode of fever in the evening but has resolved today.  Vital signs in last 24 hours: [min-max] current  Temp:  [98.5 F (36.9 C)-103 F (39.4 C)] 98.9 F (37.2 C) (03/12 0743) Pulse Rate:  [76-101] 77 (03/12 0743) Resp:  [15-34] 20 (03/12 0743) BP: (112-164)/(59-82) 112/69 (03/12 0743) SpO2:  [94 %-100 %] 96 % (03/12 0743)     Height: 5' 5.98" (167.6 cm) Weight: 80.4 kg BMI (Calculated): 28.62   Physical Exam:  Constitutional: alert, cooperative and no distress  Respiratory: breathing non-labored at rest  Cardiovascular: regular rate and sinus rhythm  Gastrointestinal: soft, mild-tender, and non-distended.  Drain wounds dry and clean  Labs:  CBC Latest Ref Rng & Units 04/06/2020 04/05/2020 10/26/2019  WBC 4.0 - 10.5 K/uL 8.7 10.2 8.4  Hemoglobin 13.0 - 17.0 g/dL 14.6 15.5 16.8  Hematocrit 39.0 - 52.0 % 41.4 44.1 50.3  Platelets 150 - 400 K/uL 161 187 179.0   CMP Latest Ref Rng & Units 04/06/2020 04/05/2020 11/18/2019  Glucose 70 - 99 mg/dL 92 104(H) 81  BUN 8 - 23 mg/dL 15 19 18   Creatinine 0.61 - 1.24 mg/dL 1.08 1.06 1.13  Sodium 135 - 145 mmol/L 133(L) 127(L) 139  Potassium 3.5 - 5.1 mmol/L 3.7 3.7 4.2  Chloride 98 - 111 mmol/L 101 94(L) 104  CO2 22 - 32 mmol/L 26 23 30   Calcium 8.9 - 10.3 mg/dL 8.3(L) 8.8(L) 9.3  Total Protein 6.5 - 8.1 g/dL - 7.0 -  Total Bilirubin 0.3 - 1.2 mg/dL - 1.3(H) -  Alkaline Phos 38 - 126 U/L -  115 -  AST 15 - 41 U/L - 37 -  ALT 0 - 44 U/L - 30 -    Imaging studies: No new pertinent imaging studies   Assessment/Plan:  70 y.o.malewith perforated appendicitis with multiple abscesses s/p percutaneous drainage, complicated by pertinent comorbidities includinghyperlipidemia, depression, anxiety.  Patient today feeling uncomfortable.  Yesterday's episode of fever has resolved.  Patient feeling better.  Patient sitting down bedside.  She is drinking plenty of water.  We will discontinue IV fluids.  We will continue with IV antibiotic therapy.  We will continue with drain in place.  We will continue with full liquids today.  If there is any improvement we will consider advancing diet tomorrow.  Encourage the patient to ambulate.  [name], MD

## 2020-04-10 NOTE — Progress Notes (Signed)
Patient ID: Brendan Bell, male   DOB: 02-04-1950, 70 y.o.   MRN: [id number]     [facility] Day(s): 5.   Post op day(s):  Marland Kitchen   Interval History: Patient seen and examined, no acute events or new complaints overnight. Patient reports feeling well.  He does report having multiple diarrhea episodes today.  There has been no fever the last 24 hours.  Max temp was 100.3 Fahrenheit.  There has been no nausea or vomiting.  Patient tolerating diet.  Vital signs in last 24 hours: [min-max] current  Temp:  [98.3 F (36.8 C)-100.3 F (37.9 C)] 99.3 F (37.4 C) (03/13 0631) Pulse Rate:  [81-88] 88 (03/13 0631) Resp:  [16-20] 18 (03/13 0631) BP: (118-138)/(70-86) 138/86 (03/13 0631) SpO2:  [96 %-98 %] 97 % (03/13 0631)     Height: 5' 5.98" (167.6 cm) Weight: 80.4 kg BMI (Calculated): 28.62   Physical Exam:  Constitutional: alert, cooperative and no distress  Respiratory: breathing non-labored at rest  Cardiovascular: regular rate and sinus rhythm  Gastrointestinal: soft, non-tender, and non-distended.  Drain in place  Labs:  CBC Latest Ref Rng & Units 04/06/2020 04/05/2020 10/26/2019  WBC 4.0 - 10.5 K/uL 8.7 10.2 8.4  Hemoglobin 13.0 - 17.0 g/dL 14.6 15.5 16.8  Hematocrit 39.0 - 52.0 % 41.4 44.1 50.3  Platelets 150 - 400 K/uL 161 187 179.0   CMP Latest Ref Rng & Units 04/06/2020 04/05/2020 11/18/2019  Glucose 70 - 99 mg/dL 92 104(H) 81  BUN 8 - 23 mg/dL 15 19 18   Creatinine 0.61 - 1.24 mg/dL 1.08 1.06 1.13  Sodium 135 - 145 mmol/L 133(L) 127(L) 139  Potassium 3.5 - 5.1 mmol/L 3.7 3.7 4.2  Chloride 98 - 111 mmol/L 101 94(L) 104  CO2 22 - 32 mmol/L 26 23 30   Calcium 8.9 - 10.3 mg/dL 8.3(L) 8.8(L) 9.3  Total Protein 6.5 - 8.1 g/dL - 7.0 -  Total Bilirubin 0.3 - 1.2 mg/dL - 1.3(H) -  Alkaline Phos 38 - 126 U/L - 115 -  AST 15 - 41 U/L - 37 -  ALT 0 - 44 U/L - 30 -    Imaging studies: No new pertinent imaging studies   Assessment/Plan:  69 y.o.malewith perforated  appendicitis with multiple abscesses s/p percutaneous drainage, complicated by pertinent comorbidities includinghyperlipidemia, depression, anxiety.  Patient without any clinical deterioration.  He continue without fever.  His abdominal exam is improved.  He continued draining pus from the right sided drain.  Clear fluid from the midline drain.  Plan is to repeat CT scan tomorrow to assess response to percutaneous drainage treatment.  We will continue with IV antibiotic therapy.  Encourage the patient to ambulate.  Will advance diet to soft diet.  [name], MD

## 2020-04-11 ENCOUNTER — Encounter: Payer: Self-pay | Admitting: General Surgery

## 2020-04-11 ENCOUNTER — Inpatient Hospital Stay: Payer: Medicare Other

## 2020-04-11 ENCOUNTER — Ambulatory Visit: Payer: Medicare Other | Admitting: Internal Medicine

## 2020-04-11 LAB — CBC
HCT: 38.1 % — ABNORMAL LOW (ref 39.0–52.0)
Hemoglobin: 13.2 g/dL (ref 13.0–17.0)
MCH: 29.9 pg (ref 26.0–34.0)
MCHC: 34.6 g/dL (ref 30.0–36.0)
MCV: 86.2 fL (ref 80.0–100.0)
Platelets: 336 10*3/uL (ref 150–400)
RBC: 4.42 MIL/uL (ref 4.22–5.81)
RDW: 14.6 % (ref 11.5–15.5)
WBC: 14.5 10*3/uL — ABNORMAL HIGH (ref 4.0–10.5)
nRBC: 0 % (ref 0.0–0.2)

## 2020-04-11 LAB — AEROBIC/ANAEROBIC CULTURE W GRAM STAIN (SURGICAL/DEEP WOUND)

## 2020-04-11 LAB — BASIC METABOLIC PANEL
Anion gap: 6 (ref 5–15)
BUN: 6 mg/dL — ABNORMAL LOW (ref 8–23)
CO2: 26 mmol/L (ref 22–32)
Calcium: 7.8 mg/dL — ABNORMAL LOW (ref 8.9–10.3)
Chloride: 104 mmol/L (ref 98–111)
Creatinine, Ser: 0.96 mg/dL (ref 0.61–1.24)
GFR, Estimated: >60 mL/min (ref >60)
Glucose, Bld: 87 mg/dL (ref 70–99)
Potassium: 3.4 mmol/L — ABNORMAL LOW (ref 3.5–5.1)
Sodium: 136 mmol/L (ref 135–145)

## 2020-04-11 MED ORDER — IOHEXOL 300 MG/ML  SOLN
100.0000 mL | Freq: Once | INTRAMUSCULAR | Status: AC | PRN
  Administered 2020-04-11: 100 mL via INTRAVENOUS

## 2020-04-11 NOTE — Care Management Important Message (Signed)
Important Message  Patient Details  Name: Brendan Bell MRN: [id number] Date of Birth: 1950-09-07   Medicare Important Message Given:  Yes     [name] 04/11/2020, 11:31 AM

## 2020-04-11 NOTE — Progress Notes (Signed)
Patient ID: Brendan Bell, male   DOB: 1950/03/04, 70 y.o.   MRN: [id number]     [facility] Day(s): 6.   Post op day(s):  Marland Kitchen   Interval History: Patient seen and examined, no acute events or new complaints overnight. Patient reports feeling well.  He denies abdominal pain.  There is no alleviating or aggravating factors identified.  There is no pain radiation.  He denies any fever or chills.  He denies any nausea or vomiting.  I ordered a follow-up CT scan that shows multiple small interloop abscesses.  The area of the abscess that was percutaneously drained has improved.  Vital signs in last 24 hours: [min-max] current  Temp:  [98.3 F (36.8 C)-99.4 F (37.4 C)] 98.9 F (37.2 C) (03/14 1211) Pulse Rate:  [57-86] 74 (03/14 1211) Resp:  [14-20] 18 (03/14 1211) BP: (114-144)/(74-98) 114/78 (03/14 1211) SpO2:  [96 %-99 %] 96 % (03/14 1211)     Height: 5' 5.98" (167.6 cm) Weight: 80.4 kg BMI (Calculated): 28.62   Physical Exam:  Constitutional: alert, cooperative and no distress  Respiratory: breathing non-labored at rest  Cardiovascular: regular rate and sinus rhythm  Gastrointestinal: soft, non-tender, and non-distended  Labs:  CBC Latest Ref Rng & Units 04/11/2020 04/06/2020 04/05/2020  WBC 4.0 - 10.5 K/uL 14.5(H) 8.7 10.2  Hemoglobin 13.0 - 17.0 g/dL 13.2 14.6 15.5  Hematocrit 39.0 - 52.0 % 38.1(L) 41.4 44.1  Platelets 150 - 400 K/uL 336 161 187   CMP Latest Ref Rng & Units 04/11/2020 04/06/2020 04/05/2020  Glucose 70 - 99 mg/dL 87 92 104(H)  BUN 8 - 23 mg/dL 6(L) 15 19  Creatinine 0.61 - 1.24 mg/dL 0.96 1.08 1.06  Sodium 135 - 145 mmol/L 136 133(L) 127(L)  Potassium 3.5 - 5.1 mmol/L 3.4(L) 3.7 3.7  Chloride 98 - 111 mmol/L 104 101 94(L)  CO2 22 - 32 mmol/L 26 26 23   Calcium 8.9 - 10.3 mg/dL 7.8(L) 8.3(L) 8.8(L)  Total Protein 6.5 - 8.1 g/dL - - 7.0  Total Bilirubin 0.3 - 1.2 mg/dL - - 1.3(H)  Alkaline Phos 38 - 126 U/L - - 115  AST 15 - 41 U/L - - 37  ALT 0  - 44 U/L - - 30    Imaging studies: I personally evaluated the images of the CT scan.  There is multiple small interloop abscesses.  Previous abscess that was percutaneously drained has been improving.  There is no free air or free fluid.   Assessment/Plan:  70 y.o.malewith perforated appendicitiswith multiple abscesses s/p percutaneous drainage, complicated by pertinent comorbidities includinghyperlipidemia, depression, anxiety.  Patient without any clinical deterioration.  He continue without fever.  CT shows improvement of the drained abscesses with multiple small interloop abscesses.  We will continue with IV antibiotic therapy.  We will continue to follow cultures.  We will continue with soft diet.  We will continue with DVT prophylaxis.  I encouraged the patient to ambulate.  [name], MD

## 2020-04-12 ENCOUNTER — Ambulatory Visit: Payer: Medicare Other | Admitting: Internal Medicine

## 2020-04-12 NOTE — Progress Notes (Signed)
Patient ID: Brendan Bell, male   DOB: 1950-12-16, 70 y.o.   MRN: [id number]     [facility] Day(s): 7.   Post op day(s):  Marland Kitchen   Interval History: Patient seen and examined, no acute events or new complaints overnight. Patient reports feeling scrotal swelling.  Denies any significant abdominal pain.  There is some radiation.  There is an alleviating or aggravating factor.  Patient denies any nausea or vomiting.  Patient reported continue passing gas.  Vital signs in last 24 hours: [min-max] current  Temp:  [97.8 F (36.6 C)-99.7 F (37.6 C)] 97.8 F (36.6 C) (03/15 1928) Pulse Rate:  [76-84] 84 (03/15 1928) Resp:  [16-17] 17 (03/15 1346) BP: (113-142)/(79-89) 142/89 (03/15 1928) SpO2:  [94 %-98 %] 98 % (03/15 1346)     Height: 5' 5.98" (167.6 cm) Weight: 80.4 kg BMI (Calculated): 28.62   Physical Exam:  Constitutional: alert, cooperative and no distress  Respiratory: breathing non-labored at rest  Cardiovascular: regular rate and sinus rhythm  Gastrointestinal: soft, non-tender, and non-distended.  Drain in place  Labs:  CBC Latest Ref Rng & Units 04/11/2020 04/06/2020 04/05/2020  WBC 4.0 - 10.5 K/uL 14.5(H) 8.7 10.2  Hemoglobin 13.0 - 17.0 g/dL 13.2 14.6 15.5  Hematocrit 39.0 - 52.0 % 38.1(L) 41.4 44.1  Platelets 150 - 400 K/uL 336 161 187   CMP Latest Ref Rng & Units 04/11/2020 04/06/2020 04/05/2020  Glucose 70 - 99 mg/dL 87 92 104(H)  BUN 8 - 23 mg/dL 6(L) 15 19  Creatinine 0.61 - 1.24 mg/dL 0.96 1.08 1.06  Sodium 135 - 145 mmol/L 136 133(L) 127(L)  Potassium 3.5 - 5.1 mmol/L 3.4(L) 3.7 3.7  Chloride 98 - 111 mmol/L 104 101 94(L)  CO2 22 - 32 mmol/L 26 26 23   Calcium 8.9 - 10.3 mg/dL 7.8(L) 8.3(L) 8.8(L)  Total Protein 6.5 - 8.1 g/dL - - 7.0  Total Bilirubin 0.3 - 1.2 mg/dL - - 1.3(H)  Alkaline Phos 38 - 126 U/L - - 115  AST 15 - 41 U/L - - 37  ALT 0 - 44 U/L - - 30    Imaging studies: No new pertinent imaging studies   Assessment/Plan:  69  y.o.malewith perforated appendicitiswith multiple abscesses s/p percutaneous drainage, complicated by pertinent comorbidities includinghyperlipidemia, depression, anxiety.  Patient without any clinical deterioration.  Without any fever in the last 24 hours.  We will continue with IV antibiotic therapy.  We will continue with current management.  We will repeat labs in the morning.  I encouraged the patient to ambulate.  Scrotal swelling not severe.  We will continue with observation.  [name], MD

## 2020-04-13 ENCOUNTER — Telehealth: Payer: Self-pay

## 2020-04-13 LAB — CBC
HCT: 37 % — ABNORMAL LOW (ref 39.0–52.0)
Hemoglobin: 12.8 g/dL — ABNORMAL LOW (ref 13.0–17.0)
MCH: 30 pg (ref 26.0–34.0)
MCHC: 34.6 g/dL (ref 30.0–36.0)
MCV: 86.9 fL (ref 80.0–100.0)
Platelets: 376 10*3/uL (ref 150–400)
RBC: 4.26 MIL/uL (ref 4.22–5.81)
RDW: 14.6 % (ref 11.5–15.5)
WBC: 12.4 10*3/uL — ABNORMAL HIGH (ref 4.0–10.5)
nRBC: 0 % (ref 0.0–0.2)

## 2020-04-13 MED ORDER — AMOXICILLIN-POT CLAVULANATE 875-125 MG PO TABS: 1.0000 | ORAL_TABLET | Freq: Two times a day (BID) | ORAL | 0 refills | Status: AC

## 2020-04-13 MED ORDER — SODIUM CHLORIDE 0.9% FLUSH: 3.0000 mL | INTRAVENOUS | 0 refills | Status: DC | PRN

## 2020-04-13 NOTE — Telephone Encounter (Signed)
Transition Care Management Follow-up Telephone Call  Date of discharge and from where: 04/13/2020, [facility]  How have you been since you were released from the hospital? Patient is doing fine. Just resting   Any questions or concerns? No  Items Reviewed:  Did the pt receive and understand the discharge instructions provided? Yes   Medications obtained and verified? Yes   Other? No   Any new allergies since your discharge? No   Dietary orders reviewed? Yes  Do you have support at home? Yes   Home Care and Equipment/Supplies: Were home health services ordered? not applicable If so, what is the name of the agency? N/A  Has the agency set up a time to come to the patient's home? not applicable Were any new equipment or medical supplies ordered?  No What is the name of the medical supply agency? N/A Were you able to get the supplies/equipment? not applicable Do you have any questions related to the use of the equipment or supplies? No  Functional Questionnaire: (I = Independent and D = Dependent) ADLs: I  Bathing/Dressing- I  Meal Prep- I  Eating- I  Maintaining continence- I  Transferring/Ambulation- I  Managing Meds- I  Follow up appointments reviewed:   PCP Hospital f/u appt confirmed? Yes  Scheduled to see Dr. Silvio Pate on 04/15/2020 @ 9:30 am.  [facility] f/u appt confirmed? Yes  Scheduled to see Dr. Windell Moment on 04/19/2020 @ 9:45 am.  Are transportation arrangements needed? No   If their condition worsens, is the pt aware to call PCP or go to the Emergency Dept.? Yes  Was the patient provided with contact information for the PCP's office or ED? Yes  Was to pt encouraged to call back with questions or concerns? Yes

## 2020-04-13 NOTE — Discharge Summary (Addendum)
Physician Discharge Summary  Patient ID: Brendan Bell MRN: [id number] DOB/AGE: 68-Jul-1952 68 y.o.  Admit date: 04/05/2020 Discharge date: 04/13/2020  Admission Diagnoses:n Acute appendicitis with abscess  Discharge Diagnoses:  Active Problems:   Acute appendicitis with appendiceal abscess   Discharged Condition: Good  Hospital Course: Patient with acute appendicitis with abscess.  Initial treatment with IV antibiotic therapy until the abscess were amenable for drainage.  Periappendiceal abscess drained percutaneously.  Follow-up CT shows improvement of the drain abscess but with multiple small abscesses interloop not amenable for drainage.  We will continue treatment with IV antibiotic therapy.  White blood cells trending down to 12,000.  Patient without any fever.  Cultures shows E. coli multisensitive.  Will transition antibiotic to Augmentin.  Consults: Interventional radiology  Significant Diagnostic Studies: labs: CBC with 12,000  Treatments: antibiotics: Zosyn and CT guided percutaneous drainage of intra abdominal abscess  Discharge Exam: Blood pressure 128/89, pulse 71, temperature 98.4 F (36.9 C), temperature source Oral, resp. rate 18, height 5' 5.98" (1.676 m), weight 80.4 kg, SpO2 96 %. General appearance: alert Resp: clear to auscultation bilaterally Cardio: regular rate and rhythm, S1, S2 normal, no murmur, click, rub or gallop GI: soft, non-tender; bowel sounds normal; no masses,  no organomegaly  Disposition: Discharge disposition: 01-Home or Self Care       Discharge Instructions    Diet - low sodium heart healthy   Complete by: As directed    Increase activity slowly   Complete by: As directed      Allergies as of 04/13/2020   No Known Allergies     Medication List    TAKE these medications   acetaminophen 650 MG CR tablet Commonly known as: TYLENOL Take 650 mg by mouth every 8 (eight) hours as needed for pain.   amoxicillin-clavulanate  875-125 MG tablet Commonly known as: Augmentin Take 1 tablet by mouth 2 (two) times daily for 14 days.   ARIPiprazole 2 MG tablet Commonly known as: ABILIFY Take 1-2 tablets (2-4 mg total) by mouth daily. What changed:   how much to take  when to take this   aspirin EC 81 MG tablet Take 81 mg by mouth daily. Swallow whole.   citalopram 40 MG tablet Commonly known as: CELEXA Take 40 mg by mouth daily.   Magnesium Gluconate 500 (27 Mg) MG Tabs Take 500 mg by mouth once a week.   Melatonin 10 MG Caps Take 10 mg by mouth at bedtime.   MULTIVITAMIN PO Take 1 tablet by mouth once a week.   Omega 3 1200 MG Caps Take 1,200 mg by mouth once a week.   sodium chloride flush 0.9 % Soln Commonly known as: NS Inject 3 mLs into the vein as needed.       Follow-up Information    Herbert Pun, MD Follow up in 1 week(s).   Specialty: General Surgery Contact information: [address] [address] [phone]             This discharge encounter was more than 30 minute most of the time counseling the patient and coordinating plan of care.   Signed: Herbert Pun 04/13/2020, 8:03 AM

## 2020-04-13 NOTE — Progress Notes (Signed)
MD ordered patient to be discharged home.  Discharge instructions were reviewed with the patient and he voiced understanding.  Follow-up appointment was made.  Prescriptions given to the patient.  Supplies given to patient for drain flush and patient instructed on it. IV was removed with catheter intact.  All patients questions were answered.  Patient left via wheelchair escorted by auxillary.

## 2020-04-13 NOTE — Discharge Instructions (Signed)
  Diet: Resume home heart healthy regular diet.   Activity: Increase activity as tolerated.   Wound care: Flush drain as needed with saline flushes as instructed.   Medications: Take antibiotic therapy as prescribed.   Call office [phone]) at any time if any questions, worsening pain, fevers/chills, bleeding, drainage from incision site, or other concerns.

## 2020-04-14 NOTE — Telephone Encounter (Signed)
Noted  

## 2020-04-15 ENCOUNTER — Other Ambulatory Visit: Payer: Self-pay | Admitting: General Surgery

## 2020-04-15 ENCOUNTER — Encounter: Payer: Self-pay | Admitting: Internal Medicine

## 2020-04-15 ENCOUNTER — Ambulatory Visit (INDEPENDENT_AMBULATORY_CARE_PROVIDER_SITE_OTHER): Payer: Medicare Other | Admitting: Internal Medicine

## 2020-04-15 ENCOUNTER — Other Ambulatory Visit: Payer: Self-pay

## 2020-04-15 DIAGNOSIS — K3533 Acute appendicitis with perforation and localized peritonitis, with abscess: Secondary | ICD-10-CM | POA: Diagnosis not present

## 2020-04-15 DIAGNOSIS — K659 Peritonitis, unspecified: Secondary | ICD-10-CM | POA: Diagnosis not present

## 2020-04-15 NOTE — Assessment & Plan Note (Signed)
From ruptured appendix Multiple small abscesses----tube is still in, but not really draining  He is getting CT again--I guess to decide if abscesses worse or not  Likely will just need drain pulled and continue extended course of augmentin Then if any increased symptoms, will need the surgery

## 2020-04-15 NOTE — Progress Notes (Signed)
Subjective:    Patient ID: Brendan Bell, male    DOB: [birth year], 70 y.o.   MRN: [id number]  HPI Here for hospital follow up This visit occurred during the SARS-CoV-2 public health emergency.  Safety protocols were in place, including screening questions prior to the visit, additional usage of staff PPE, and extensive cleaning of exam room while observing appropriate contact time as indicated for disinfecting solutions.   Reviewed hospital course and spoke to patient while he was there  Had some abdominal pain--but not that bad (2/10) Had diarrhea and vomiting Fever spiked once to 101 Then improved some Then the pain moved up right side some---and wife felt he was washed out Took him to hospital---CT showed ruptured appendix and multiple small abscesses  Drainage percutaneous--drain still in Not getting much out--and is having some drainage along the tube IV antibiotics transitioned to oral augmentin  Went home 2 days Appetite is good  Did see the surgeon this morning Blood work done He is setting up CT scan--to reevaluate  Current Outpatient Medications on File Prior to Visit  Medication Sig Dispense Refill  . acetaminophen (TYLENOL) 650 MG CR tablet Take 650 mg by mouth every 8 (eight) hours as needed for pain.    Marland Kitchen amoxicillin-clavulanate (AUGMENTIN) 875-125 MG tablet Take 1 tablet by mouth 2 (two) times daily for 14 days. 28 tablet 0  . ARIPiprazole (ABILIFY) 2 MG tablet Take 1-2 tablets (2-4 mg total) by mouth daily. (Patient taking differently: Take 2 mg by mouth at bedtime.) 60 tablet 1  . aspirin EC 81 MG tablet Take 81 mg by mouth daily. Swallow whole.    . citalopram (CELEXA) 40 MG tablet Take 40 mg by mouth daily.    . Magnesium Gluconate 500 (27 Mg) MG TABS Take 500 mg by mouth once a week.    . Melatonin 10 MG CAPS Take 10 mg by mouth at bedtime.    . Multiple Vitamins-Minerals (MULTIVITAMIN PO) Take 1 tablet by mouth once a week.    . Omega 3 1200 MG CAPS  Take 1,200 mg by mouth once a week.    . sodium chloride flush (NS) 0.9 % SOLN Inject 3 mLs into the vein as needed. 30 Syringe 0   No current facility-administered medications on file prior to visit.    No Known Allergies  Past Medical History:  Diagnosis Date  . Anxiety   . Depression   . Hyperlipidemia   . Osteoarthritis   . Pneumonia 10/04/98   out patient  . Rhinitis, allergic     Past Surgical History:  Procedure Laterality Date  . COLONOSCOPY    . EYE SURGERY Bilateral   . JOINT REPLACEMENT  2/13///9/13   medial right knee///medial left knee---Dr Jefm Bryant  . KNEE ARTHROSCOPY W/ PARTIAL MEDIAL MENISCECTOMY Right 2008   with pateller chondroplasty ([facility])  . KNEE SURGERY  1992   right, loose body removed  . KNEE SURGERY     multiple surgeries to both knees  . MEDIAL PARTIAL KNEE REPLACEMENT Left    Dr. Jefm Bryant  . MEDIAL PARTIAL KNEE REPLACEMENT Right    Dr. Jefm Bryant  . TONSILLECTOMY    . TOTAL HIP ARTHROPLASTY Right 01/08/2018   Procedure: RIGHT TOTAL HIP ARTHROPLASTY ANTERIOR APPROACH;  Surgeon: Gaynelle Arabian, MD;  Location: [facility];  Service: Orthopedics;  Laterality: Right;  156min  . TOTAL KNEE ARTHROPLASTY WITH REVISION COMPONENTS Left 02/21/2015   Procedure: TOTAL KNEE ARTHROPLASTY WITH REVISION COMPONENTS;  Surgeon: Frederik Pear,  MD;  Location: [facility];  Service: Orthopedics;  Laterality: Left;    Family History  Problem Relation Age of Onset  . Heart disease Mother        CAD, MI  . Hypertension Mother   . Stroke Father   . Aneurysm Father        died from rupture of AAA  . Cancer Paternal Grandfather        prostate    Social History   Socioeconomic History  . Marital status: Married    Spouse name: Not on file  . Number of children: 5  . Years of education: Not on file  . Highest education level: Not on file  Occupational History  . 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 Occupation: Landlord  Tobacco Use  .  Smoking status: Never Smoker  . Smokeless tobacco: Never Used  Vaping Use  . Vaping Use: Never used  Substance and Sexual Activity  . Alcohol use: Yes    Comment: 2 beers a day in general.  . Drug use: No  . Sexual activity: Not on file  Other Topics Concern  . Not on file  Social History Narrative   2nd marriage.  3 children from first marriage, 2 stepchildren, 2 with current marriage.      Has living will   Wife is health care POA--son Hilliard Clark would be alternate   Would accept resuscitation   Would accept tube feeds briefly but not if cognitively unaware   Social Determinants of Health   Financial Resource Strain: Not on file  Food Insecurity: Not on file  Transportation Needs: Not on file  Physical Activity: Not on file  Stress: Not on file  Social Connections: Not on file  Intimate Partner Violence: Not on file   Review of Systems Has lost a few pounds through this illness Sleep is difficult due to nocturia--better since home No N/V Bowels are loose---but not watery. Usually 1-2 per day    Objective:   Physical Exam Constitutional:      Appearance: Normal appearance.  Pulmonary:     Effort: Pulmonary effort is normal.     Breath sounds: No wheezing or rales.     Comments: Decreased breath sounds and dullness at right base Abdominal:     Palpations: Abdomen is soft.     Comments: Some RLQ tenderness over drain site---but no rebound or referred pain from other areas  Musculoskeletal:     Cervical back: Neck supple.  Lymphadenopathy:     Cervical: No cervical adenopathy.  Neurological:     Mental Status: He is alert.            Assessment & Plan:

## 2020-04-18 ENCOUNTER — Other Ambulatory Visit: Payer: Self-pay

## 2020-04-18 ENCOUNTER — Ambulatory Visit (HOSPITAL_COMMUNITY)
Admission: RE | Admit: 2020-04-18 | Discharge: 2020-04-18 | Disposition: A | Discharge status: Home / Self Care | Payer: Medicare Other | Source: Ambulatory Visit | Attending: General Surgery | Admitting: General Surgery

## 2020-04-18 DIAGNOSIS — K3533 Acute appendicitis with perforation and localized peritonitis, with abscess: Secondary | ICD-10-CM | POA: Insufficient documentation

## 2020-04-18 DIAGNOSIS — K6389 Other specified diseases of intestine: Secondary | ICD-10-CM | POA: Diagnosis not present

## 2020-04-18 DIAGNOSIS — K3189 Other diseases of stomach and duodenum: Secondary | ICD-10-CM | POA: Diagnosis not present

## 2020-04-18 DIAGNOSIS — Z7982 Long term (current) use of aspirin: Secondary | ICD-10-CM | POA: Insufficient documentation

## 2020-04-18 DIAGNOSIS — K7689 Other specified diseases of liver: Secondary | ICD-10-CM | POA: Diagnosis not present

## 2020-04-18 DIAGNOSIS — K651 Peritoneal abscess: Secondary | ICD-10-CM | POA: Diagnosis not present

## 2020-04-18 DIAGNOSIS — Z79899 Other long term (current) drug therapy: Secondary | ICD-10-CM | POA: Insufficient documentation

## 2020-04-18 DIAGNOSIS — K3532 Acute appendicitis with perforation and localized peritonitis, without abscess: Secondary | ICD-10-CM | POA: Diagnosis not present

## 2020-04-18 HISTORY — PX: IR SINUS/FIST TUBE CHK-NON GI: IMG673

## 2020-04-18 MED ORDER — IOHEXOL 300 MG/ML  SOLN
100.0000 mL | Freq: Once | INTRAMUSCULAR | Status: AC | PRN
  Administered 2020-04-18: 100 mL via INTRAVENOUS

## 2020-04-18 MED ORDER — IOHEXOL 300 MG/ML  SOLN
50.0000 mL | Freq: Once | INTRAMUSCULAR | Status: AC | PRN
  Administered 2020-04-18: 5 mL

## 2020-04-18 NOTE — Progress Notes (Signed)
Referring Physician(s): Cintron-Diaz,Edgardo  Supervising Physician: Markus Daft  Patient Status:  [facility]- outpatient  Chief Complaint: Perforated appendicitis  Subjective: Brendan Bell is a 70 year old male with perforated appendicitis, complicated by multiple contained abscesses.  Due to high risk for surgery, IR was consulted for intra-abdominal drain placement.  He underwent successful RLQ and pelvic drain placements by Dr. Dwaine Gale 04/08/20.  He did have some improvement afterward and was discharged home with drains in place.   Brendan Bell returns [facility] to [facility] for drain evaluation.  His pelvic drain was removed last week in the surgeon's office.  He now only has the remaining RUQ drain.  There is leakage around the insertion site. He has not been able to flush it. He is otherwise feeling much better. Denies fever, chills, nausea, vomiting, abdominal pain.  Reports he is eating and drinking well.    Allergies: Patient has no known allergies.  Medications: Prior to Admission medications   Medication Sig Start Date End Date Taking? Authorizing Provider  acetaminophen (TYLENOL) 650 MG CR tablet Take 650 mg by mouth every 8 (eight) hours as needed for pain.    [provider]  amoxicillin-clavulanate (AUGMENTIN) 875-125 MG tablet Take 1 tablet by mouth 2 (two) times daily for 14 days. 04/13/20 04/27/20  Herbert Pun, MD  ARIPiprazole (ABILIFY) 2 MG tablet Take 1-2 tablets (2-4 mg total) by mouth daily. Patient taking differently: Take 2 mg by mouth at bedtime. 12/29/19   [name], MD  aspirin EC 81 MG tablet Take 81 mg by mouth daily. Swallow whole.    [provider]  citalopram (CELEXA) 40 MG tablet Take 40 mg by mouth daily.    [provider]  Magnesium Gluconate 500 (27 Mg) MG TABS Take 500 mg by mouth once a week.    [provider]  Melatonin 10 MG CAPS Take 10 mg by mouth at bedtime.    [provider]  Multiple  Vitamins-Minerals (MULTIVITAMIN PO) Take 1 tablet by mouth once a week.    [provider]  Omega 3 1200 MG CAPS Take 1,200 mg by mouth once a week.    [provider]  sodium chloride flush (NS) 0.9 % SOLN Inject 3 mLs into the vein as needed. 04/13/20 05/13/20  Herbert Pun, MD     Vital Signs: There were no vitals taken for this visit.  Physical Exam  NAD, alert Abdomen: soft, non-distended.  RLQ drain in place.  Insertion site intact with mild irritation from leakage around the tube.  Dressing has some dark-appearing output.  There is dark vs. Feculent output in the bulb.   Imaging: No results found.  Labs:  CBC: Recent Labs    04/05/20 1854 04/06/20 0424 04/11/20 0525 04/13/20 0442  WBC 10.2 8.7 14.5* 12.4*  HGB 15.5 14.6 13.2 12.8*  HCT 44.1 41.4 38.1* 37.0*  PLT 187 161 336 376    COAGS: No results for input(s): INR, APTT in the last 8760 hours.  BMP: Recent Labs    11/18/19 0825 04/05/20 1854 04/06/20 0424 04/11/20 0525  NA 139 127* 133* 136  K 4.2 3.7 3.7 3.4*  CL 104 94* 101 104  CO2 30 23 26 26   GLUCOSE 81 104* 92 87  BUN 18 19 15  6*  CALCIUM 9.3 8.8* 8.3* 7.8*  CREATININE 1.13 1.06 1.08 0.96  GFRNONAA  --  >60 >60 >60    LIVER FUNCTION TESTS: Recent Labs    04/29/19  8841 10/26/19 1406 11/18/19 0825 04/05/20 1854  BILITOT 0.7 1.0  --  1.3*  AST 22 22  --  37  ALT 18 19  --  30  ALKPHOS 60 51  --  115  PROT 6.7 6.8  --  7.0  ALBUMIN 4.3 4.3 4.1 3.3*    Assessment and Plan: Perforated appendicits with abscess formation Intra-abdominal fluid collection aspiration and drainage, RLQ drain and pelvic drain placed 04/08/20 by Dr. Dwaine Gale Patient no longer has his pelvic drain as it was removed by the surgeon's office.  Reports his current remaining the drain in the RLQ has foul-smelling drainage and leaks from the insertion site.  Reports he has been unable to flush the drain for the past 1 week or more.  Otherwise  feeling well and continues his current course of antibiotics for approximately 10 more days.  CT Abdomen Pelvis obtained, reviewed by Dr. Anselm Pancoast who notes ongoing fluid collections scattered throughout the abdomen all of which appear to have some level of improvement. His RLQ abscess appears resolved.   Drain injection shows no fistulous connection to the bowel.   Drain removed by this PA in its entirety without complication.  No further needs in IR at this time.  Patient understands he should continue his antibiotics until course complete and call his surgeon's office for follow-up or with any new concerning symptoms.   Electronically Signed: Docia Barrier, PA 04/18/2020, 11:13 AM   I spent a total of 25 Minutes  greater than 50% of which was counseling/coordinating care for perforated appendicitis, intra-abdominal fluid collections.

## 2020-04-18 NOTE — Procedures (Signed)
Interventional Radiology Procedure:   Indications: History of appendicitis with intra-abdominal abscesses  Procedure: Drain injection and drain removal  Findings: No bowel fistula.  Small residual cavity.  Complications: None     EBL: None  Plan: Drain was removed.  Continue antibiotics and follow up with General Surgery.   Rolinda Impson R. Anselm Pancoast, MD  Pager: [phone]

## 2020-04-26 DIAGNOSIS — K3533 Acute appendicitis with perforation and localized peritonitis, with abscess: Secondary | ICD-10-CM | POA: Diagnosis not present

## 2020-05-03 ENCOUNTER — Ambulatory Visit (INDEPENDENT_AMBULATORY_CARE_PROVIDER_SITE_OTHER): Payer: Medicare Other | Admitting: Internal Medicine

## 2020-05-03 ENCOUNTER — Other Ambulatory Visit: Payer: Self-pay

## 2020-05-03 ENCOUNTER — Encounter: Payer: Self-pay | Admitting: Internal Medicine

## 2020-05-03 VITALS — BP 122/84 | HR 69 | Temp 97.2°F | Ht 66.5 in | Wt 176.0 lb

## 2020-05-03 DIAGNOSIS — F331 Major depressive disorder, recurrent, moderate: Secondary | ICD-10-CM

## 2020-05-03 DIAGNOSIS — Z7189 Other specified counseling: Secondary | ICD-10-CM

## 2020-05-03 DIAGNOSIS — Z8673 Personal history of transient ischemic attack (TIA), and cerebral infarction without residual deficits: Secondary | ICD-10-CM

## 2020-05-03 DIAGNOSIS — M8949 Other hypertrophic osteoarthropathy, multiple sites: Secondary | ICD-10-CM | POA: Diagnosis not present

## 2020-05-03 DIAGNOSIS — Z0000 Encounter for general adult medical examination without abnormal findings: Secondary | ICD-10-CM

## 2020-05-03 DIAGNOSIS — Z23 Encounter for immunization: Secondary | ICD-10-CM | POA: Diagnosis not present

## 2020-05-03 DIAGNOSIS — M159 Polyosteoarthritis, unspecified: Secondary | ICD-10-CM

## 2020-05-03 MED ORDER — ARIPIPRAZOLE 2 MG PO TABS: 2.0000 mg | ORAL_TABLET | Freq: Every day | ORAL | 3 refills | Status: DC

## 2020-05-03 NOTE — Assessment & Plan Note (Signed)
I have personally reviewed the Medicare Annual Wellness questionnaire and have noted 1. The patient's medical and social history 2. Their use of alcohol, tobacco or illicit drugs 3. Their current medications and supplements 4. The patient's functional ability including ADL's, fall risks, home safety risks and hearing or visual             impairment. 5. Diet and physical activities 6. Evidence for depression or mood disorders  The patients weight, height, BMI and visual acuity have been recorded in the chart I have made referrals, counseling and provided education to the patient based review of the above and I have provided the pt with a written personalized care plan for preventive services.  I have provided you with a copy of your personalized plan for preventive services. Please take the time to review along with your updated medication list.  Colon fine last year Will defer PSA to next year Regular exercise Pneumovax today COVID booster and flu vaccine in the fall

## 2020-05-03 NOTE — Assessment & Plan Note (Signed)
Controlled with the paroxetine and low dose aripiprazole

## 2020-05-03 NOTE — Progress Notes (Signed)
Subjective:    Patient ID: Brendan Bell, male    DOB: 03-28-1950, 70 y.o.   MRN: [id number]  HPI Here for Medicare wellness visit and follow up of chronic health conditions This visit occurred during the SARS-CoV-2 public health emergency.  Safety protocols were in place, including screening questions prior to the visit, additional usage of staff PPE, and extensive cleaning of exam room while observing appropriate contact time as indicated for disinfecting solutions.   Reviewed form and advanced directives Reviewed other doctors Enjoys occasional alcohol--rare bourbon, mostly 1-2 beers a day No tobacco Regular exercise Vision is okay. Hearing is fine No falls Chronic depression is controlled Independent with instrumental ADLs No sig memory issues  Has been improving No pain No drainage from tract of previous drain Done with the antibiotics Probably planning elective appendectomy  Depression is controlled Sertraline and the low dose aripiprazole More satisfied with sex life now Sleeps pretty well now  Does have some pain after more vigorous exercise--mostly muscular Left knee pain after yard work---better now No more chiropractic or physiatry appts Will take acetaminophen prn  Current Outpatient Medications on File Prior to Visit  Medication Sig Dispense Refill  . acetaminophen (TYLENOL) 650 MG CR tablet Take 650 mg by mouth every 8 (eight) hours as needed for pain.    . ARIPiprazole (ABILIFY) 2 MG tablet Take 1-2 tablets (2-4 mg total) by mouth daily. (Patient taking differently: Take 2 mg by mouth at bedtime.) 60 tablet 1  . aspirin EC 81 MG tablet Take 81 mg by mouth daily. Swallow whole.    . citalopram (CELEXA) 40 MG tablet Take 40 mg by mouth daily.    . Magnesium Gluconate 500 (27 Mg) MG TABS Take 500 mg by mouth once a week.    . Melatonin 10 MG CAPS Take 6 mg by mouth at bedtime.    . Multiple Vitamins-Minerals (MULTIVITAMIN PO) Take 1 tablet by mouth once a  week.    . Omega 3 1200 MG CAPS Take 1,200 mg by mouth once a week.     No current facility-administered medications on file prior to visit.    No Known Allergies  Past Medical History:  Diagnosis Date  . Anxiety   . Depression   . Hyperlipidemia   . Osteoarthritis   . Pneumonia 10/04/98   out patient  . Rhinitis, allergic     Past Surgical History:  Procedure Laterality Date  . COLONOSCOPY    . EYE SURGERY Bilateral   . IR SINUS/FIST TUBE CHK-NON GI  04/18/2020  . JOINT REPLACEMENT  2/13///9/13   medial right knee///medial left knee---Dr Jefm Bryant  . KNEE ARTHROSCOPY W/ PARTIAL MEDIAL MENISCECTOMY Right 2008   with pateller chondroplasty ([facility])  . KNEE SURGERY  1992   right, loose body removed  . KNEE SURGERY     multiple surgeries to both knees  . MEDIAL PARTIAL KNEE REPLACEMENT Left    Dr. Jefm Bryant  . MEDIAL PARTIAL KNEE REPLACEMENT Right    Dr. Jefm Bryant  . TONSILLECTOMY    . TOTAL HIP ARTHROPLASTY Right 01/08/2018   Procedure: RIGHT TOTAL HIP ARTHROPLASTY ANTERIOR APPROACH;  Surgeon: Gaynelle Arabian, MD;  Location: [facility];  Service: Orthopedics;  Laterality: Right;  153min  . TOTAL KNEE ARTHROPLASTY WITH REVISION COMPONENTS Left 02/21/2015   Procedure: TOTAL KNEE ARTHROPLASTY WITH REVISION COMPONENTS;  Surgeon: Frederik Pear, MD;  Location: [facility];  Service: Orthopedics;  Laterality: Left;    Family History  Problem Relation Age of  Onset  . Heart disease Mother        CAD, MI  . Hypertension Mother   . Stroke Father   . Aneurysm Father        died from rupture of AAA  . Cancer Paternal Grandfather        prostate    Social History   Socioeconomic History  . Marital status: Married    Spouse name: Not on file  . Number of children: 5  . Years of education: Not on file  . Highest education level: Not on file  Occupational History  . 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 Occupation: Landlord  Tobacco Use  . Smoking status:  Never Smoker  . Smokeless tobacco: Never Used  Vaping Use  . Vaping Use: Never used  Substance and Sexual Activity  . Alcohol use: Yes    Comment: 2 beers a day in general.  . Drug use: No  . Sexual activity: Not on file  Other Topics Concern  . Not on file  Social History Narrative   2nd marriage.  3 children from first marriage, 2 stepchildren, 2 with current marriage.      Has living will   Wife is health care POA--daughters Erin/Francie  ould be alternate   Would accept resuscitation   Would accept tube feeds briefly but not if cognitively unaware   Social Determinants of Health   Financial Resource Strain: Not on file  Food Insecurity: Not on file  Transportation Needs: Not on file  Physical Activity: Not on file  Stress: Not on file  Social Connections: Not on file  Intimate Partner Violence: Not on file   Review of Systems Appetite is good Weight down 8# during hospital stay---improving now Sleeps well Wears seat belt Teeth fine--keeps up with dentist No suspicious skin lesions No heartburn or dysphagia Bowels fine---did have colonoscopy in past year Voids okay----nocturia x 1-2. Flow is slow--but not too bad No chest pain or SOB No dizziness or syncope    Objective:   Physical Exam Constitutional:      Appearance: Normal appearance.  HENT:     Mouth/Throat:     Comments: No lesions Eyes:     Conjunctiva/sclera: Conjunctivae normal.     Pupils: Pupils are equal, round, and reactive to light.  Cardiovascular:     Rate and Rhythm: Normal rate and regular rhythm.     Pulses: Normal pulses.     Heart sounds: No murmur heard. No gallop.   Pulmonary:     Effort: Pulmonary effort is normal.     Breath sounds: Normal breath sounds. No wheezing or rales.  Abdominal:     Palpations: Abdomen is soft.     Tenderness: There is no abdominal tenderness.  Musculoskeletal:     Cervical back: Neck supple.     Right lower leg: No edema.     Left lower leg: No  edema.  Lymphadenopathy:     Cervical: No cervical adenopathy.  Skin:    General: Skin is warm.     Findings: No rash.  Neurological:     Mental Status: He is alert and oriented to person, place, and time.     Comments: President--- "Waymond Cera, first black President" [id number] Recall 3/3  Psychiatric:        Mood and Affect: Mood normal.        Behavior: Behavior normal.  Assessment & Plan:

## 2020-05-03 NOTE — Assessment & Plan Note (Signed)
Minimal carotid findings MRI reassuring Is on daily ASA

## 2020-05-03 NOTE — Addendum Note (Signed)
Addended by: Pilar Grammes on: 05/03/2020 11:42 AM   Modules accepted: Orders

## 2020-05-03 NOTE — Assessment & Plan Note (Signed)
No worrisome issues Uses the tylenol prn

## 2020-05-03 NOTE — Progress Notes (Signed)
Hearing Screening   Method: Audiometry   125Hz  250Hz  500Hz  1000Hz  2000Hz  3000Hz  4000Hz  6000Hz  8000Hz   Right ear:   20 20 20   0    Left ear:   20 20 20   0      Visual Acuity Screening   Right eye Left eye Both eyes  Without correction: 20/100 20/15 20/20  With correction:

## 2020-05-03 NOTE — Assessment & Plan Note (Signed)
See social history 

## 2020-05-09 DIAGNOSIS — K3533 Acute appendicitis with perforation and localized peritonitis, with abscess: Secondary | ICD-10-CM | POA: Diagnosis not present

## 2020-05-17 DIAGNOSIS — K3533 Acute appendicitis with perforation and localized peritonitis, with abscess: Secondary | ICD-10-CM | POA: Diagnosis not present

## 2020-07-11 ENCOUNTER — Ambulatory Visit: Payer: Self-pay | Admitting: General Surgery

## 2020-07-12 ENCOUNTER — Ambulatory Visit: Payer: Self-pay | Admitting: General Surgery

## 2020-07-12 DIAGNOSIS — K3533 Acute appendicitis with perforation and localized peritonitis, with abscess: Secondary | ICD-10-CM | POA: Diagnosis not present

## 2020-07-12 NOTE — H&P (View-Only) (Signed)
HISTORY OF PRESENT ILLNESS:    Brendan Bell is a 70 y.o.male patient who comes for surgical discussion of history of appendicitis with abscess.  Patient comes to the office today for discussion of appendectomy.  Patient has a history of appendicitis with multiple abscess.  The main abscess was treated with percutaneous drainage and IV antibiotic therapy.  Patient responded well.  Patient has been doing well.  Patient denies any chronic right lower quadrant pain.  Patient denies any nausea or vomiting.  At this moment there is no pain.  There is no pain radiation.  There has been no alleviating or aggravating factors.      PAST MEDICAL HISTORY:  Past Medical History:  Diagnosis Date   Anxiety         PAST SURGICAL HISTORY:   Past Surgical History:  Procedure Laterality Date   COLONOSCOPY  01/12/2009   COLONOSCOPY     COLONOSCOPY  09/10/2019   Internal Hemorroids/Otherwise normal colon/Repeat 29yrs/CTL   ltk     rtk     tonsilectomy           MEDICATIONS:  Outpatient Encounter Medications as of 07/12/2020  Medication Sig Dispense Refill   ARIPiprazole (ABILIFY) 2 MG tablet Take 1 tablet by mouth once daily     aspirin 81 MG EC tablet Take by mouth once daily     cholecalciferol (VITAMIN D3) 2,000 unit capsule Take by mouth     citalopram (CELEXA) 40 MG tablet Take 40 mg by mouth once daily     magnesium gluconate (MAGONATE) 27.5 mg magne- sium (500 mg) tablet Take by mouth as needed     melatonin 10 mg Cap Take by mouth nightly     omega-3 fatty acids-fish oil (ONE-PER-DAY OMEGA-3) 684-1,200 mg CpDR Take by mouth as needed     gabapentin (NEURONTIN) 100 MG capsule  (Patient not taking: No sig reported)     No facility-administered encounter medications on file as of 07/12/2020.     ALLERGIES:   Patient has no known allergies.   SOCIAL HISTORY:  Social History   Socioeconomic History   Marital status: Married  Tobacco Use   Smoking status: Never Smoker   Smokeless  tobacco: Never Used  Scientific laboratory technician Use: Never used  Substance and Sexual Activity   Alcohol use: Yes    Alcohol/week: 2.0 standard drinks    Types: 2 Cans of beer per week   Drug use: No    FAMILY HISTORY:  Family History  Problem Relation Age of Onset   Myocardial Infarction (Heart attack) Mother    Aortic aneurysm Father      GENERAL REVIEW OF SYSTEMS:   General ROS: negative for - chills, fatigue, fever, weight gain or weight loss Allergy and Immunology ROS: negative for - hives  Hematological and Lymphatic ROS: negative for - bleeding problems or bruising, negative for palpable nodes Endocrine ROS: negative for - heat or cold intolerance, hair changes Respiratory ROS: negative for - cough, shortness of breath or wheezing Cardiovascular ROS: no chest pain or palpitations GI ROS: negative for nausea, vomiting, abdominal pain, diarrhea, constipation Musculoskeletal ROS: negative for - joint swelling or muscle pain Neurological ROS: negative for - confusion, syncope Dermatological ROS: negative for pruritus and rash  PHYSICAL EXAM:  Vitals:   07/12/20 0919  BP: 131/88  Pulse: 72  .  Ht:168.9 cm (5' 6.5") Wt:79.4 kg (175 lb) WGY:KZLD surface area is 1.93 meters squared. Body mass index is 27.82  kg/m.Marland Kitchen   GENERAL: Alert, active, oriented x3  HEENT: Pupils equal reactive to light. Extraocular movements are intact. Sclera clear. Palpebral conjunctiva normal red color.Pharynx clear.  NECK: Supple with no palpable mass and no adenopathy.  LUNGS: Sound clear with no rales rhonchi or wheezes.  HEART: Regular rhythm S1 and S2 without murmur.  ABDOMEN: Soft and depressible, nontender with no palpable mass, no hepatomegaly.   EXTREMITIES: Well-developed well-nourished symmetrical with no dependent edema.  NEUROLOGICAL: Awake alert oriented, facial expression symmetrical, moving all extremities.      IMPRESSION:     Acute appendicitis with peritoneal abscess  [K35.33]     This was treated initially with percutaneous drain and antibiotic therapy.  Patient responded well.  Due to the difficulty of the management of these perforated appendicitis patient decided to proceed with interval appendectomy.  I discussed with the patient the risk of surgery that includes pain, infection, bleeding, injury to the intestine.  Leak from staple line, obstruction, recurrent intra-abdominal abscess, enterocutaneous fistula, among others.  The patient reports he understood and agreed to proceed.       PLAN:  Robotic assisted laparoscopic appendectomy ([id number]) Avoid taking aspirin 5 days before surgery Contact us if you have any concern.   Patient or representative verbalized understanding, all questions were answered, and were agreeable with the plan outlined above.   Herbert Pun, MD  Electronically signed by Herbert Pun, MD

## 2020-07-12 NOTE — H&P (Signed)
HISTORY OF PRESENT ILLNESS:    [name] is a 70 y.o.male patient who comes for surgical discussion of history of appendicitis with abscess.  Patient comes to the office today for discussion of appendectomy.  Patient has a history of appendicitis with multiple abscess.  The main abscess was treated with percutaneous drainage and IV antibiotic therapy.  Patient responded well.  Patient has been doing well.  Patient denies any chronic right lower quadrant pain.  Patient denies any nausea or vomiting.  At this moment there is no pain.  There is no pain radiation.  There has been no alleviating or aggravating factors.      PAST MEDICAL HISTORY:  Past Medical History:  Diagnosis Date   Anxiety         PAST SURGICAL HISTORY:   Past Surgical History:  Procedure Laterality Date   COLONOSCOPY  01/12/2009   COLONOSCOPY     COLONOSCOPY  09/10/2019   Internal Hemorroids/Otherwise normal colon/Repeat 19yrs/CTL   ltk     rtk     tonsilectomy           MEDICATIONS:  Outpatient Encounter Medications as of 07/12/2020  Medication Sig Dispense Refill   ARIPiprazole (ABILIFY) 2 MG tablet Take 1 tablet by mouth once daily     aspirin 81 MG EC tablet Take by mouth once daily     cholecalciferol (VITAMIN D3) 2,000 unit capsule Take by mouth     citalopram (CELEXA) 40 MG tablet Take 40 mg by mouth once daily     magnesium gluconate (MAGONATE) 27.5 mg magne- sium (500 mg) tablet Take by mouth as needed     melatonin 10 mg Cap Take by mouth nightly     omega-3 fatty acids-fish oil (ONE-PER-DAY OMEGA-3) 684-1,200 mg CpDR Take by mouth as needed     gabapentin (NEURONTIN) 100 MG capsule  (Patient not taking: No sig reported)     No facility-administered encounter medications on file as of 07/12/2020.     ALLERGIES:   Patient has no known allergies.   SOCIAL HISTORY:  Social History   Socioeconomic History   Marital status: Married  Tobacco Use   Smoking status: Never Smoker   Smokeless  tobacco: Never Used  Scientific laboratory technician Use: Never used  Substance and Sexual Activity   Alcohol use: Yes    Alcohol/week: 2.0 standard drinks    Types: 2 Cans of beer per week   Drug use: No    FAMILY HISTORY:  Family History  Problem Relation Age of Onset   Myocardial Infarction (Heart attack) Mother    Aortic aneurysm Father      GENERAL REVIEW OF SYSTEMS:   General ROS: negative for - chills, fatigue, fever, weight gain or weight loss Allergy and Immunology ROS: negative for - hives  Hematological and Lymphatic ROS: negative for - bleeding problems or bruising, negative for palpable nodes Endocrine ROS: negative for - heat or cold intolerance, hair changes Respiratory ROS: negative for - cough, shortness of breath or wheezing Cardiovascular ROS: no chest pain or palpitations GI ROS: negative for nausea, vomiting, abdominal pain, diarrhea, constipation Musculoskeletal ROS: negative for - joint swelling or muscle pain Neurological ROS: negative for - confusion, syncope Dermatological ROS: negative for pruritus and rash  PHYSICAL EXAM:  Vitals:   07/12/20 0919  BP: 131/88  Pulse: 72  .  Ht:168.9 cm (5' 6.5") Wt:79.4 kg (175 lb) ZDG:LOVF surface area is 1.93 meters squared. Body mass index is 27.82  kg/m.Marland Kitchen   GENERAL: Alert, active, oriented x3  HEENT: Pupils equal reactive to light. Extraocular movements are intact. Sclera clear. Palpebral conjunctiva normal red color.Pharynx clear.  NECK: Supple with no palpable mass and no adenopathy.  LUNGS: Sound clear with no rales rhonchi or wheezes.  HEART: Regular rhythm S1 and S2 without murmur.  ABDOMEN: Soft and depressible, nontender with no palpable mass, no hepatomegaly.   EXTREMITIES: Well-developed well-nourished symmetrical with no dependent edema.  NEUROLOGICAL: Awake alert oriented, facial expression symmetrical, moving all extremities.      IMPRESSION:     Acute appendicitis with peritoneal abscess  [K35.33]     This was treated initially with percutaneous drain and antibiotic therapy.  Patient responded well.  Due to the difficulty of the management of these perforated appendicitis patient decided to proceed with interval appendectomy.  I discussed with the patient the risk of surgery that includes pain, infection, bleeding, injury to the intestine.  Leak from staple line, obstruction, recurrent intra-abdominal abscess, enterocutaneous fistula, among others.  The patient reports he understood and agreed to proceed.       PLAN:  Robotic assisted laparoscopic appendectomy (33354) Avoid taking aspirin 5 days before surgery Contact us if you have any concern.   Patient or representative verbalized understanding, all questions were answered, and were agreeable with the plan outlined above.   Herbert Pun, MD  Electronically signed by Herbert Pun, MD

## 2020-07-19 ENCOUNTER — Other Ambulatory Visit: Payer: Self-pay

## 2020-07-19 ENCOUNTER — Other Ambulatory Visit
Admission: RE | Admit: 2020-07-19 | Discharge: 2020-07-19 | Disposition: A | Discharge status: Home / Self Care | Payer: Medicare Other | Source: Ambulatory Visit | Attending: General Surgery | Admitting: General Surgery

## 2020-07-19 HISTORY — DX: COVID-19: U07.1

## 2020-07-19 NOTE — Patient Instructions (Addendum)
Your procedure is scheduled on: 07/27/20 - Wednesday Report to the Registration Desk on the 1st floor of the [facility]. To find out your arrival time, please call [phone] between 1PM - 3PM on: 07/26/20 - Tuesday Report to [facility] for labs/bag/covid test on 07/25/20 at 8:30 am.  REMEMBER: Instructions that are not followed completely may result in serious medical risk, up to and including death; or upon the discretion of your surgeon and anesthesiologist your surgery may need to be rescheduled.  Do not eat food or drink any fluids after midnight the night before surgery.  No gum chewing, lozengers or hard candies.  TAKE THESE MEDICATIONS THE MORNING OF SURGERY WITH A SIP OF WATER: - citalopram (CELEXA) 40 MG tablet  Follow recommendations from Cardiologist, Pulmonologist or PCP regarding stopping Aspirin, Coumadin, Plavix, Eliquis, Pradaxa, or Pletal. Stop taking Aspirin 81 mg beginning 07/22/20, may resume with MD order.  One week prior to surgery:Stop taking beginning 07/19/20. Stop Anti-inflammatories (NSAIDS) such as Advil, Aleve, Ibuprofen, Motrin, Naproxen, Naprosyn and Aspirin based products such as Excedrin, Goodys Powder, BC Powder.  Stop ANY OVER THE COUNTER supplements beginning 07/19/20 until after surgery. VITAMIN D3, MAGNESIUM GLUCONATE , Omega-3 Fatty Acids, melatonin.  You may take Tylenol as directed if needed for pain up until the day of surgery.  No Alcohol for 24 hours before or after surgery.  No Smoking including e-cigarettes for 24 hours prior to surgery.  No chewable tobacco products for at least 6 hours prior to surgery.  No nicotine patches on the day of surgery.  Do not use any "recreational" drugs for at least a week prior to your surgery.  Please be advised that the combination of cocaine and anesthesia may have negative outcomes, up to and including death. If you test positive for cocaine, your surgery will be cancelled.  On the morning of  surgery brush your teeth with toothpaste and water, you may rinse your mouth with mouthwash if you wish. Do not swallow any toothpaste or mouthwash.  Do not wear jewelry, make-up, hairpins, clips or nail polish.  Do not wear lotions, powders, or perfumes.   Do not shave body from the neck down 48 hours prior to surgery just in case you cut yourself which could leave a site for infection.  Also, freshly shaved skin may become irritated if using the CHG soap.  Contact lenses, hearing aids and dentures may not be worn into surgery.  Do not bring valuables to the hospital. [facility] is not responsible for any missing/lost belongings or valuables.   Use CHG Soap or wipes as directed on instruction sheet.  Notify your doctor if there is any change in your medical condition (cold, fever, infection).  Wear comfortable clothing (specific to your surgery type) to the hospital.  After surgery, you can help prevent lung complications by doing breathing exercises.  Take deep breaths and cough every 1-2 hours. Your doctor may order a device called an Incentive Spirometer to help you take deep breaths. When coughing or sneezing, hold a pillow firmly against your incision with both hands. This is called "splinting." Doing this helps protect your incision. It also decreases belly discomfort.  If you are being admitted to the hospital overnight, leave your suitcase in the car. After surgery it may be brought to your room.  If you are being discharged the day of surgery, you will not be allowed to drive home. You will need a responsible adult (18 years or older) to  drive you home and stay with you that night.   If you are taking public transportation, you will need to have a responsible adult (18 years or older) with you. Please confirm with your physician that it is acceptable to use public transportation.   Please call the [facility] at [phone] if you have any questions  about these instructions.  Surgery Visitation Policy:  Patients undergoing a surgery or procedure may have one family member or support person with them as long as that person is not COVID-19 positive or experiencing its symptoms.  That person may remain in the waiting area during the procedure.  Inpatient Visitation:    Visiting hours are 7 a.m. to 8 p.m. Inpatients will be allowed two visitors daily. The visitors may change each day during the patient's stay. No visitors under the age of 11. Any visitor under the age of 2 must be accompanied by an adult. The visitor must pass COVID-19 screenings, use hand sanitizer when entering and exiting the patient's room and wear a mask at all times, including in the patient's room. Patients must also wear a mask when staff or their visitor are in the room. Masking is required regardless of vaccination status.

## 2020-07-25 ENCOUNTER — Other Ambulatory Visit
Admission: RE | Admit: 2020-07-25 | Discharge: 2020-07-25 | Disposition: A | Discharge status: Home / Self Care | Payer: Medicare Other | Source: Ambulatory Visit | Attending: General Surgery | Admitting: General Surgery

## 2020-07-25 ENCOUNTER — Other Ambulatory Visit: Payer: Self-pay

## 2020-07-25 DIAGNOSIS — Z20822 Contact with and (suspected) exposure to covid-19: Secondary | ICD-10-CM | POA: Diagnosis not present

## 2020-07-25 DIAGNOSIS — Z01818 Encounter for other preprocedural examination: Secondary | ICD-10-CM | POA: Diagnosis not present

## 2020-07-25 DIAGNOSIS — Z01810 Encounter for preprocedural cardiovascular examination: Secondary | ICD-10-CM | POA: Diagnosis not present

## 2020-07-25 LAB — CBC
HCT: 49 % (ref 39.0–52.0)
Hemoglobin: 16.3 g/dL (ref 13.0–17.0)
MCH: 29.3 pg (ref 26.0–34.0)
MCHC: 33.3 g/dL (ref 30.0–36.0)
MCV: 88 fL (ref 80.0–100.0)
Platelets: 179 10*3/uL (ref 150–400)
RBC: 5.57 MIL/uL (ref 4.22–5.81)
RDW: 14.2 % (ref 11.5–15.5)
WBC: 5.8 10*3/uL (ref 4.0–10.5)
nRBC: 0 % (ref 0.0–0.2)

## 2020-07-25 LAB — TYPE AND SCREEN
ABO/RH(D): A POS
Antibody Screen: NEGATIVE

## 2020-07-25 LAB — SARS CORONAVIRUS 2 (TAT 6-24 HRS): SARS Coronavirus 2: NEGATIVE

## 2020-07-26 DIAGNOSIS — M25461 Effusion, right knee: Secondary | ICD-10-CM | POA: Diagnosis not present

## 2020-07-26 DIAGNOSIS — M25561 Pain in right knee: Secondary | ICD-10-CM | POA: Diagnosis not present

## 2020-07-26 DIAGNOSIS — Z96651 Presence of right artificial knee joint: Secondary | ICD-10-CM | POA: Diagnosis not present

## 2020-07-27 ENCOUNTER — Ambulatory Visit: Payer: Medicare Other | Admitting: Urgent Care

## 2020-07-27 ENCOUNTER — Encounter
Admission: RE | Disposition: A | Discharge status: Home / Self Care | Payer: Self-pay | Source: Ambulatory Visit | Attending: General Surgery

## 2020-07-27 ENCOUNTER — Encounter: Payer: Self-pay | Admitting: General Surgery

## 2020-07-27 ENCOUNTER — Other Ambulatory Visit: Payer: Self-pay

## 2020-07-27 ENCOUNTER — Ambulatory Visit
Admission: RE | Admit: 2020-07-27 | Discharge: 2020-07-27 | Disposition: A | Discharge status: Home / Self Care | Payer: Medicare Other | Source: Ambulatory Visit | Attending: General Surgery | Admitting: General Surgery

## 2020-07-27 DIAGNOSIS — K3533 Acute appendicitis with perforation and localized peritonitis, with abscess: Secondary | ICD-10-CM | POA: Diagnosis not present

## 2020-07-27 DIAGNOSIS — K36 Other appendicitis: Secondary | ICD-10-CM | POA: Diagnosis not present

## 2020-07-27 DIAGNOSIS — M6028 Foreign body granuloma of soft tissue, not elsewhere classified, other site: Secondary | ICD-10-CM | POA: Insufficient documentation

## 2020-07-27 DIAGNOSIS — K388 Other specified diseases of appendix: Secondary | ICD-10-CM | POA: Diagnosis not present

## 2020-07-27 DIAGNOSIS — Z7982 Long term (current) use of aspirin: Secondary | ICD-10-CM | POA: Diagnosis not present

## 2020-07-27 DIAGNOSIS — Z79899 Other long term (current) drug therapy: Secondary | ICD-10-CM | POA: Insufficient documentation

## 2020-07-27 HISTORY — PX: XI ROBOTIC LAPAROSCOPIC ASSISTED APPENDECTOMY: SHX6877

## 2020-07-27 SURGERY — APPENDECTOMY, ROBOT-ASSISTED, LAPAROSCOPIC
Anesthesia: General | Site: Abdomen

## 2020-07-27 MED ORDER — SODIUM CHLORIDE FLUSH 0.9 % IV SOLN
INTRAVENOUS | Status: AC
  Filled 2020-07-27: qty 20

## 2020-07-27 MED ORDER — CHLORHEXIDINE GLUCONATE 0.12 % MT SOLN
15.0000 mL | Freq: Once | OROMUCOSAL | Status: AC
  Administered 2020-07-27: 15 mL via OROMUCOSAL

## 2020-07-27 MED ORDER — LIDOCAINE HCL (CARDIAC) PF 100 MG/5ML IV SOSY
PREFILLED_SYRINGE | INTRAVENOUS | Status: DC | PRN
  Administered 2020-07-27: 80 mg via INTRAVENOUS

## 2020-07-27 MED ORDER — ONDANSETRON HCL 4 MG/2ML IJ SOLN
INTRAMUSCULAR | Status: AC
  Filled 2020-07-27: qty 2

## 2020-07-27 MED ORDER — HYDROCODONE-ACETAMINOPHEN 5-325 MG PO TABS: 1.0000 | ORAL_TABLET | ORAL | 0 refills | Status: AC | PRN

## 2020-07-27 MED ORDER — ACETAMINOPHEN 10 MG/ML IV SOLN
INTRAVENOUS | Status: DC | PRN
  Administered 2020-07-27: 1000 mg via INTRAVENOUS

## 2020-07-27 MED ORDER — PHENYLEPHRINE HCL (PRESSORS) 10 MG/ML IV SOLN
INTRAVENOUS | Status: AC
  Filled 2020-07-27: qty 1

## 2020-07-27 MED ORDER — ACETAMINOPHEN 10 MG/ML IV SOLN
INTRAVENOUS | Status: AC
  Filled 2020-07-27: qty 100

## 2020-07-27 MED ORDER — KETOROLAC TROMETHAMINE 30 MG/ML IJ SOLN
INTRAMUSCULAR | Status: AC
  Filled 2020-07-27: qty 1

## 2020-07-27 MED ORDER — SUGAMMADEX SODIUM 200 MG/2ML IV SOLN
INTRAVENOUS | Status: DC | PRN
  Administered 2020-07-27: 200 mg via INTRAVENOUS

## 2020-07-27 MED ORDER — MIDAZOLAM HCL 2 MG/2ML IJ SOLN
INTRAMUSCULAR | Status: AC
  Filled 2020-07-27: qty 2

## 2020-07-27 MED ORDER — PROPOFOL 10 MG/ML IV BOLUS
INTRAVENOUS | Status: AC
  Filled 2020-07-27: qty 20

## 2020-07-27 MED ORDER — MIDAZOLAM HCL 2 MG/2ML IJ SOLN
INTRAMUSCULAR | Status: DC | PRN
  Administered 2020-07-27: 2 mg via INTRAVENOUS

## 2020-07-27 MED ORDER — CEFAZOLIN SODIUM-DEXTROSE 2-4 GM/100ML-% IV SOLN
INTRAVENOUS | Status: AC
  Filled 2020-07-27: qty 100

## 2020-07-27 MED ORDER — DEXAMETHASONE SODIUM PHOSPHATE 10 MG/ML IJ SOLN
INTRAMUSCULAR | Status: DC | PRN
  Administered 2020-07-27: 5 mg via INTRAVENOUS

## 2020-07-27 MED ORDER — ROCURONIUM BROMIDE 10 MG/ML (PF) SYRINGE
PREFILLED_SYRINGE | INTRAVENOUS | Status: AC
  Filled 2020-07-27: qty 10

## 2020-07-27 MED ORDER — FENTANYL CITRATE (PF) 100 MCG/2ML IJ SOLN
INTRAMUSCULAR | Status: AC
  Administered 2020-07-27: 25 ug via INTRAVENOUS
  Filled 2020-07-27: qty 2

## 2020-07-27 MED ORDER — CHLORHEXIDINE GLUCONATE 0.12 % MT SOLN
OROMUCOSAL | Status: AC
  Filled 2020-07-27: qty 15

## 2020-07-27 MED ORDER — ONDANSETRON HCL 4 MG/2ML IJ SOLN
INTRAMUSCULAR | Status: DC | PRN
  Administered 2020-07-27: 4 mg via INTRAVENOUS

## 2020-07-27 MED ORDER — ROCURONIUM BROMIDE 100 MG/10ML IV SOLN
INTRAVENOUS | Status: DC | PRN
  Administered 2020-07-27: 50 mg via INTRAVENOUS

## 2020-07-27 MED ORDER — DEXMEDETOMIDINE HCL 200 MCG/2ML IV SOLN
INTRAVENOUS | Status: DC | PRN
  Administered 2020-07-27: 16 ug via INTRAVENOUS

## 2020-07-27 MED ORDER — SEVOFLURANE IN SOLN
RESPIRATORY_TRACT | Status: AC
  Filled 2020-07-27: qty 250

## 2020-07-27 MED ORDER — PROPOFOL 10 MG/ML IV BOLUS
INTRAVENOUS | Status: DC | PRN
  Administered 2020-07-27: 180 mg via INTRAVENOUS

## 2020-07-27 MED ORDER — FAMOTIDINE 20 MG PO TABS
20.0000 mg | ORAL_TABLET | Freq: Once | ORAL | Status: AC
  Administered 2020-07-27: 20 mg via ORAL

## 2020-07-27 MED ORDER — FENTANYL CITRATE (PF) 100 MCG/2ML IJ SOLN: 25.0000 ug | INTRAMUSCULAR | Status: DC | PRN

## 2020-07-27 MED ORDER — FENTANYL CITRATE (PF) 100 MCG/2ML IJ SOLN
INTRAMUSCULAR | Status: DC | PRN
  Administered 2020-07-27: 50 ug via INTRAVENOUS

## 2020-07-27 MED ORDER — FENTANYL CITRATE (PF) 100 MCG/2ML IJ SOLN
INTRAMUSCULAR | Status: AC
  Filled 2020-07-27: qty 2

## 2020-07-27 MED ORDER — CEFAZOLIN SODIUM-DEXTROSE 2-4 GM/100ML-% IV SOLN
2.0000 g | INTRAVENOUS | Status: AC
  Administered 2020-07-27: 2 g via INTRAVENOUS

## 2020-07-27 MED ORDER — LACTATED RINGERS IV SOLN: INTRAVENOUS | Status: DC

## 2020-07-27 MED ORDER — PHENYLEPHRINE HCL (PRESSORS) 10 MG/ML IV SOLN
INTRAVENOUS | Status: DC | PRN
  Administered 2020-07-27: 100 ug via INTRAVENOUS

## 2020-07-27 MED ORDER — ONDANSETRON HCL 4 MG/2ML IJ SOLN
4.0000 mg | Freq: Once | INTRAMUSCULAR | Status: DC | PRN
Start: 2020-07-27 — End: 2020-07-27

## 2020-07-27 MED ORDER — LACTATED RINGERS IV SOLN: INTRAVENOUS | Status: DC | PRN

## 2020-07-27 MED ORDER — DEXAMETHASONE SODIUM PHOSPHATE 10 MG/ML IJ SOLN
INTRAMUSCULAR | Status: AC
  Filled 2020-07-27: qty 1

## 2020-07-27 MED ORDER — FAMOTIDINE 20 MG PO TABS
ORAL_TABLET | ORAL | Status: AC
  Filled 2020-07-27: qty 1

## 2020-07-27 MED ORDER — BUPIVACAINE-EPINEPHRINE (PF) 0.5% -1:200000 IJ SOLN
INTRAMUSCULAR | Status: DC | PRN
  Administered 2020-07-27: 30 mL

## 2020-07-27 MED ORDER — KETOROLAC TROMETHAMINE 60 MG/2ML IM SOLN
INTRAMUSCULAR | Status: DC | PRN
  Administered 2020-07-27: 30 mg via INTRAMUSCULAR

## 2020-07-27 MED ORDER — ORAL CARE MOUTH RINSE: 15.0000 mL | Freq: Once | OROMUCOSAL | Status: AC

## 2020-07-27 MED ORDER — BUPIVACAINE-EPINEPHRINE (PF) 0.5% -1:200000 IJ SOLN
INTRAMUSCULAR | Status: AC
  Filled 2020-07-27: qty 30

## 2020-07-27 SURGICAL SUPPLY — 65 items
ADH SKN CLS APL DERMABOND .7 (GAUZE/BANDAGES/DRESSINGS) ×1
APL PRP STRL LF DISP 70% ISPRP (MISCELLANEOUS) ×1
BAG INFUSER PRESSURE 100CC (MISCELLANEOUS) IMPLANT
BAG SPEC RTRVL LRG 6X4 10 (ENDOMECHANICALS) ×1
BLADE SURG SZ11 CARB STEEL (BLADE) ×2 IMPLANT
CANISTER SUCT 1200ML W/VALVE (MISCELLANEOUS) IMPLANT
CANNULA REDUC XI 12-8 STAPL (CANNULA) ×2
CANNULA REDUCER 12-8 DVNC XI (CANNULA) ×1 IMPLANT
CHLORAPREP W/TINT 26 (MISCELLANEOUS) ×2 IMPLANT
COVER TIP SHEARS 8 DVNC (MISCELLANEOUS) ×1 IMPLANT
COVER TIP SHEARS 8MM DA VINCI (MISCELLANEOUS) ×2
DEFOGGER SCOPE WARMER CLEARIFY (MISCELLANEOUS) ×2 IMPLANT
DERMABOND ADVANCED (GAUZE/BANDAGES/DRESSINGS) ×1
DERMABOND ADVANCED .7 DNX12 (GAUZE/BANDAGES/DRESSINGS) ×1 IMPLANT
DRAPE ARM DVNC X/XI (DISPOSABLE) ×4 IMPLANT
DRAPE COLUMN DVNC XI (DISPOSABLE) ×1 IMPLANT
DRAPE DA VINCI XI ARM (DISPOSABLE) ×8
DRAPE DA VINCI XI COLUMN (DISPOSABLE) ×2
ELECT REM PT RETURN 9FT ADLT (ELECTROSURGICAL) ×2
ELECTRODE REM PT RTRN 9FT ADLT (ELECTROSURGICAL) ×1 IMPLANT
GAUZE 4X4 16PLY ~~LOC~~+RFID DBL (SPONGE) IMPLANT
GLOVE SURG SYN 6.5 ES PF (GLOVE) ×4 IMPLANT
GLOVE SURG UNDER POLY LF SZ6.5 (GLOVE) ×8 IMPLANT
GOWN STRL REUS W/ TWL LRG LVL3 (GOWN DISPOSABLE) ×4 IMPLANT
GOWN STRL REUS W/TWL LRG LVL3 (GOWN DISPOSABLE) ×8
IRRIGATOR SUCT 8 DISP DVNC XI (IRRIGATION / IRRIGATOR) IMPLANT
IRRIGATOR SUCTION 8MM XI DISP (IRRIGATION / IRRIGATOR)
IV NS 1000ML (IV SOLUTION)
IV NS 1000ML BAXH (IV SOLUTION) IMPLANT
KIT PINK PAD W/HEAD ARE REST (MISCELLANEOUS) ×2
KIT PINK PAD W/HEAD ARM REST (MISCELLANEOUS) ×1 IMPLANT
LABEL OR SOLS (LABEL) ×2 IMPLANT
MANIFOLD NEPTUNE II (INSTRUMENTS) ×2 IMPLANT
NEEDLE HYPO 22GX1.5 SAFETY (NEEDLE) ×2 IMPLANT
NEEDLE INSUFFLATION 14GA 120MM (NEEDLE) ×2 IMPLANT
OBTURATOR OPTICAL STANDARD 8MM (TROCAR) ×2
OBTURATOR OPTICAL STND 8 DVNC (TROCAR) ×1
OBTURATOR OPTICALSTD 8 DVNC (TROCAR) ×1 IMPLANT
PACK LAP CHOLECYSTECTOMY (MISCELLANEOUS) ×2 IMPLANT
POUCH SPECIMEN RETRIEVAL 10MM (ENDOMECHANICALS) ×2 IMPLANT
RELOAD STAPLER 3.5X45 BLU DVNC (STAPLE) IMPLANT
SEAL CANN UNIV 5-8 DVNC XI (MISCELLANEOUS) ×3 IMPLANT
SEAL XI 5MM-8MM UNIVERSAL (MISCELLANEOUS) ×6
SEALER VESSEL DA VINCI XI (MISCELLANEOUS)
SEALER VESSEL EXT DVNC XI (MISCELLANEOUS) IMPLANT
SET TUBE SMOKE EVAC HIGH FLOW (TUBING) ×2 IMPLANT
SOLUTION ELECTROLUBE (MISCELLANEOUS) ×2 IMPLANT
SPONGE T-LAP 4X18 ~~LOC~~+RFID (SPONGE) IMPLANT
STAPLER 45 DA VINCI SURE FORM (STAPLE)
STAPLER 45 SUREFORM DVNC (STAPLE) IMPLANT
STAPLER CANNULA SEAL DVNC XI (STAPLE) ×1 IMPLANT
STAPLER CANNULA SEAL XI (STAPLE) ×2
STAPLER RELOAD 3.5X45 BLU DVNC (STAPLE)
STAPLER RELOAD 3.5X45 BLUE (STAPLE)
SUT MNCRL 4-0 (SUTURE) ×4
SUT MNCRL 4-0 27XMFL (SUTURE) ×2
SUT MNCRL AB 4-0 PS2 18 (SUTURE) IMPLANT
SUT VIC AB 2-0 SH 27 (SUTURE) ×2
SUT VIC AB 2-0 SH 27XBRD (SUTURE) ×1 IMPLANT
SUT VICRYL 0 AB UR-6 (SUTURE) ×2 IMPLANT
SUT VLOC 90 6 CV-15 VIOLET (SUTURE) ×2 IMPLANT
SUT VLOC 90 S/L VL9 GS22 (SUTURE) ×2 IMPLANT
SUTURE MNCRL 4-0 27XMF (SUTURE) ×2 IMPLANT
SYR 30ML LL (SYRINGE) ×2 IMPLANT
TRAY FOLEY MTR SLVR 16FR STAT (SET/KITS/TRAYS/PACK) IMPLANT

## 2020-07-27 NOTE — Anesthesia Preprocedure Evaluation (Signed)
Anesthesia Evaluation  Patient identified by MRN, date of birth, ID band Patient awake    Reviewed: Allergy & Precautions, H&P , NPO status , Patient's Chart, lab work & pertinent test results, reviewed documented beta blocker date and time   Airway Mallampati: II  TM Distance: >3 FB Neck ROM: full    Dental  (+) Teeth Intact   Pulmonary pneumonia, resolved,    Pulmonary exam normal        Cardiovascular Exercise Tolerance: Good negative cardio ROS Normal cardiovascular exam Rhythm:regular Rate:Normal     Neuro/Psych PSYCHIATRIC DISORDERS Anxiety Depression  Neuromuscular disease    GI/Hepatic negative GI ROS, Neg liver ROS,   Endo/Other  negative endocrine ROS  Renal/GU negative Renal ROS  negative genitourinary   Musculoskeletal   Abdominal   Peds  Hematology negative hematology ROS (+)   Anesthesia Other Findings Past Medical History: No date: Anxiety No date: COVID-19 No date: Depression No date: Hyperlipidemia No date: Osteoarthritis 10/04/1998: Pneumonia     Comment:  out patient No date: Rhinitis, allergic Past Surgical History: No date: COLONOSCOPY No date: EYE SURGERY; Bilateral 04/18/2020: IR SINUS/FIST TUBE CHK-NON GI 2/13///9/13: JOINT REPLACEMENT     Comment:  medial right knee///medial left knee---Dr [facility] 2008: KNEE ARTHROSCOPY W/ PARTIAL MEDIAL MENISCECTOMY; Right     Comment:  with pateller chondroplasty ([facility]) 1992: KNEE SURGERY     Comment:  right, loose body removed No date: KNEE SURGERY     Comment:  multiple surgeries to both knees No date: MEDIAL PARTIAL KNEE REPLACEMENT; Left     Comment:  Dr. Jefm Bryant No date: MEDIAL PARTIAL KNEE REPLACEMENT; Right     Comment:  Dr. Jefm Bryant No date: TONSILLECTOMY 01/08/2018: TOTAL HIP ARTHROPLASTY; Right     Comment:  Procedure: RIGHT TOTAL HIP ARTHROPLASTY ANTERIOR               APPROACH;  Surgeon: Gaynelle Arabian, MD;  Location: [facility]                [facility];  Service: Orthopedics;  Laterality: Right;  133min 02/21/2015: TOTAL KNEE ARTHROPLASTY WITH REVISION COMPONENTS; Left     Comment:  Procedure: TOTAL KNEE ARTHROPLASTY WITH REVISION               COMPONENTS;  Surgeon: Frederik Pear, MD;  Location: [facility];               Service: Orthopedics;  Laterality: Left; BMI    Body Mass Index: 29.25 kg/m     Reproductive/Obstetrics negative OB ROS                             Anesthesia Physical Anesthesia Plan  ASA: 2  Anesthesia Plan: General ETT   Post-op Pain Management:    Induction:   PONV Risk Score and Plan: 4 or greater  Airway Management Planned:   Additional Equipment:   Intra-op Plan:   Post-operative Plan:   Informed Consent: I have reviewed the patients History and Physical, chart, labs and discussed the procedure including the risks, benefits and alternatives for the proposed anesthesia with the patient or authorized representative who has indicated his/her understanding and acceptance.     Dental Advisory Given  Plan Discussed with: CRNA  Anesthesia Plan Comments:         Anesthesia Quick Evaluation

## 2020-07-27 NOTE — Anesthesia Procedure Notes (Signed)
Procedure Name: Intubation Date/Time: 07/27/2020 7:43 AM Performed by: Justus Memory, CRNA Pre-anesthesia Checklist: Patient identified, Patient being monitored, Timeout performed, Emergency Drugs available and Suction available Patient Re-evaluated:Patient Re-evaluated prior to induction Oxygen Delivery Method: Circle system utilized Preoxygenation: Pre-oxygenation with 100% oxygen Induction Type: IV induction Ventilation: Mask ventilation without difficulty Laryngoscope Size: Mac, 3 and McGraph Grade View: Grade I Tube type: Oral Tube size: 7.0 mm Number of attempts: 1 Airway Equipment and Method: Stylet and Video-laryngoscopy Placement Confirmation: ETT inserted through vocal cords under direct vision, positive ETCO2 and breath sounds checked- equal and bilateral Secured at: 21 cm Tube secured with: Tape Dental Injury: Teeth and Oropharynx as per pre-operative assessment  Difficulty Due To: Difficulty was anticipated and Difficult Airway- due to anterior larynx Future Recommendations: Recommend- induction with short-acting agent, and alternative techniques readily available

## 2020-07-27 NOTE — Discharge Instructions (Addendum)
  Diet: Resume home heart healthy regular diet.   Activity: No heavy lifting >20 pounds (children, pets, laundry, garbage) or strenuous activity until follow-up, but light activity and walking are encouraged. Do not drive or drink alcohol if taking narcotic pain medications.  Wound care: May shower with soapy water and pat dry (do not rub incisions), but no baths or submerging incision underwater until follow-up. (no swimming)   Medications: Resume all home medications. For mild to moderate pain: acetaminophen (Tylenol) ***or ibuprofen (if no kidney disease). Combining Tylenol with alcohol can substantially increase your risk of causing liver disease. Narcotic pain medications, if prescribed, can be used for severe pain, though may cause nausea, constipation, and drowsiness. Do not combine Tylenol and Norco within a 6 hour period as Norco contains Tylenol. If you do not need the narcotic pain medication, you do not need to fill the prescription.  Call office ([phone]) at any time if any questions, worsening pain, fevers/chills, bleeding, drainage from incision site, or other concerns.   AMBULATORY SURGERY  DISCHARGE INSTRUCTIONS   The drugs that you were given will stay in your system until tomorrow so for the next 24 hours you should not:  Drive an automobile Make any legal decisions Drink any alcoholic beverage   You may resume regular meals tomorrow.  Today it is better to start with liquids and gradually work up to solid foods.  You may eat anything you prefer, but it is better to start with liquids, then soup and crackers, and gradually work up to solid foods.   Please notify your doctor immediately if you have any unusual bleeding, trouble breathing, redness and pain at the surgery site, drainage, fever, or pain not relieved by medication.    Additional Instructions:        Please contact your physician with any problems or Same Day Surgery at [phone], Monday  through Friday 6 am to 4 pm, or [facility] number at [phone].  

## 2020-07-27 NOTE — Interval H&P Note (Signed)
History and Physical Interval Note:  07/27/2020 6:55 AM  Brendan Bell  has presented today for surgery, with the diagnosis of K35.33 Acute appendicitis w/ peritoneal abscess.  The various methods of treatment have been discussed with the patient and family. After consideration of risks, benefits and other options for treatment, the patient has consented to  Procedure(s): XI ROBOTIC LAPAROSCOPIC ASSISTED APPENDECTOMY (N/A) as a surgical intervention.  The patient's history has been reviewed, patient examined, no change in status, stable for surgery.  I have reviewed the patient's chart and labs.  Questions were answered to the patient's satisfaction.     Herbert Pun

## 2020-07-27 NOTE — Transfer of Care (Signed)
Immediate Anesthesia Transfer of Care Note  Patient: Brendan Bell  Procedure(s) Performed: XI ROBOTIC LAPAROSCOPIC ASSISTED APPENDECTOMY (Abdomen)  Patient Location: PACU  Anesthesia Type:General  Level of Consciousness: sedated  Airway & Oxygen Therapy: Patient Spontanous Breathing and Patient connected to face mask oxygen  Post-op Assessment: Report given to RN and Post -op Vital signs reviewed and stable  Post vital signs: Reviewed and stable  Last Vitals:  Vitals Value Taken Time  BP 142/67 07/27/20 0854  Temp    Pulse 80 07/27/20 0859  Resp 15 07/27/20 0859  SpO2 100 % 07/27/20 0859  Vitals shown include unvalidated device data.  Last Pain:  Vitals:   07/27/20 0618  TempSrc: Oral  PainSc: 0-No pain         Complications: No notable events documented.

## 2020-07-27 NOTE — Op Note (Signed)
Pre-op Diagnosis: History of appendicitis with abscess  Post op Diagnosis: History of appendicitis with abscess  Procedure: Robotic assisted laparoscopic appendectomy.  Anesthesia: GETA  Surgeon: Herbert Pun, MD, FACS  Wound Classification: clean contaminated  Specimen: Appendix  Complications: None  Estimated Blood Loss: 3 mL   Indications: Patient is a 70 y.o. male  presented with above right lower quadrant pain. CT scan shows acute appendicitis.     FIndings: 1.  Chronic appendicitis with surrounding adhesions     2. No peri-appendiceal abscess or phlegmon 3. Normal anatomy 4. Adequate hemostasis.   Description of procedure: The patient was placed on the operating table in the supine position. General anesthesia was induced. A time-out was completed verifying correct patient, procedure, site, positioning, and implant(s) and/or special equipment prior to beginning this procedure. The abdomen was prepped and draped in the usual sterile fashion.   Palmer's point located and Veress needle was inserted.  After confirming 2 clicks and a positive saline drop test, gas insufflation was initiated until the abdominal pressure was measured at 15 mmHg.  Afterwards, the Veress needle was removed and a 8 mm port was placed in left upper quadrant area using Optiview technique.  After local was infused, 3 additional incision on the left abdominal wall were made 5 cm apart.  An 12 mm port and two other 8 mm ports were placed under direct visualization.  No injuries from trocar placements were noted.  The table was placed in the Trendelenburg position with the right side elevated.  With the use of Tip up grasper, Force Bipolar and Vessel sealer, an inflamed appendix was identified and elevated.  Window created at base of appendix in the mesentery.    The mesoappendix was ligated with bipolar instrument and divided with scissors. The base of the appendix was ligated with 2-0 V lock and  appendix divided. Appendix orifice closure reinforced with second layer of 2-0 Vlock.    The appendiceal stump was examined and hemostasis noted. No other pathology was identified within pelvis. The 12 mm trocar removed and port site closed with PMI using 0 vicryl under direct vision. Remaining trocars were removed under direct vision. No bleeding was noted.The abdomen was allowed to collapse.  All skin incisions then closed with subcuticular sutures Monocryl 4-0.  Wounds then dressed with dermabond.  The patient tolerated the procedure well, awakened from anesthesia and was taken to the postanesthesia care unit in satisfactory condition.  Sponge count and instrument count correct at the end of the procedure.

## 2020-07-28 LAB — SURGICAL PATHOLOGY

## 2020-07-28 NOTE — Anesthesia Postprocedure Evaluation (Signed)
Anesthesia Post Note  Patient: Brendan Bell  Procedure(s) Performed: XI ROBOTIC LAPAROSCOPIC ASSISTED APPENDECTOMY (Abdomen)  Patient location during evaluation: PACU Anesthesia Type: General Level of consciousness: awake and alert Pain management: pain level controlled Vital Signs Assessment: post-procedure vital signs reviewed and stable Respiratory status: spontaneous breathing, nonlabored ventilation, respiratory function stable and patient connected to nasal cannula oxygen Cardiovascular status: blood pressure returned to baseline and stable Postop Assessment: no apparent nausea or vomiting Anesthetic complications: no   No notable events documented.   Last Vitals:  Vitals:   07/27/20 0931 07/27/20 0956  BP: 128/85 128/84  Pulse: 72 72  Resp: 16 16  Temp: (!) 36.2 C (!) 36.2 C  SpO2: 96% 96%    Last Pain:  Vitals:   07/27/20 0956  TempSrc:   PainSc: [place]

## 2020-08-02 ENCOUNTER — Telehealth: Payer: Self-pay

## 2020-08-02 NOTE — Telephone Encounter (Signed)
Spoke to pt's daughter as she answered the phone. She said he is doing well after his appendectomy. Some irritation where he bends at the waist , but otherwise doing well.

## 2020-08-03 DIAGNOSIS — M25561 Pain in right knee: Secondary | ICD-10-CM | POA: Diagnosis not present

## 2020-08-23 DIAGNOSIS — M25561 Pain in right knee: Secondary | ICD-10-CM | POA: Diagnosis not present

## 2020-08-29 NOTE — Telephone Encounter (Signed)
Please set up Brendan Bell [facility] Setting) to see me in the near future---next visit (probably need 30 minutes though==but 15 okay if that is all I have)

## 2020-09-12 NOTE — Telephone Encounter (Signed)
Pt has appt 09-14-20

## 2020-10-26 ENCOUNTER — Ambulatory Visit: Payer: Medicare Other | Admitting: Internal Medicine

## 2020-10-31 ENCOUNTER — Other Ambulatory Visit: Payer: Self-pay | Admitting: Internal Medicine

## 2020-12-28 IMAGING — MR MR LUMBAR SPINE W/O CM
5 series · 43 of 48 positions shown · non-contrast
Comparison: Lumbar spine radiographs 07/27/2019

CLINICAL DATA: Low back pain for greater than 6 weeks. Right lower
extremity radiculopathy extending to the thigh.

EXAM:
MRI LUMBAR SPINE WITHOUT CONTRAST
TECHNIQUE: Multiplanar, multisequence MR imaging of the lumbar spine was
performed. No intravenous contrast was administered.

[Series 3: T2 post-contrast · sagittal · 4.0mm · 0.88mm/px · 6 of 14 slices shown]
[im 1/14]
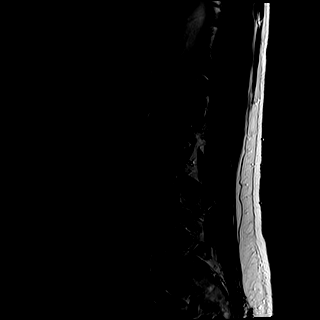
[im 3/14]
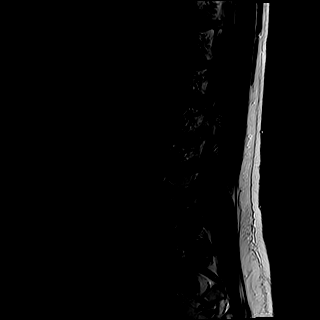
[im 6/14]
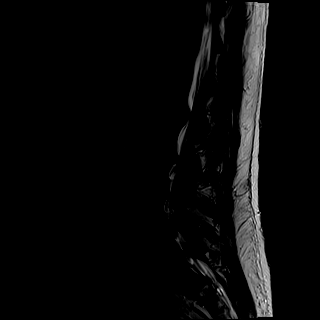
[im 8/14]
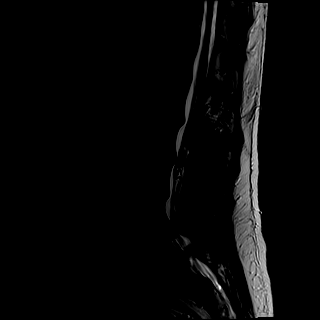
[im 11/14]
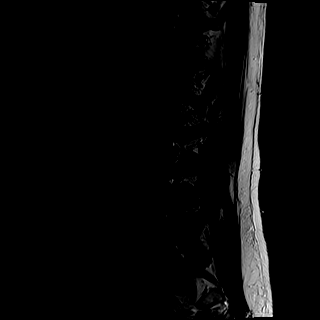
[im 14/14]
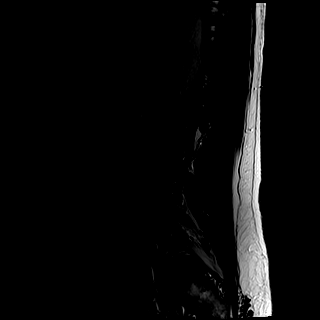

[Series 4: T1 · sagittal · 4.0mm · 1.09mm/px · 6 of 14 slices shown (1 of 2)]
[im 1/14]
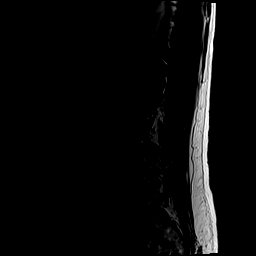
[im 3/14]
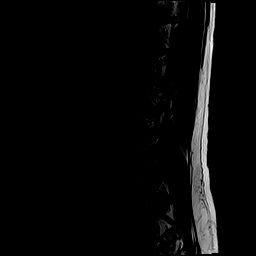
[im 6/14]
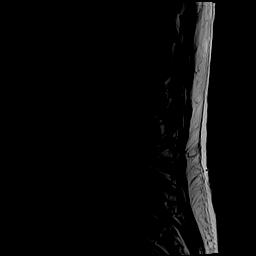
[im 8/14]
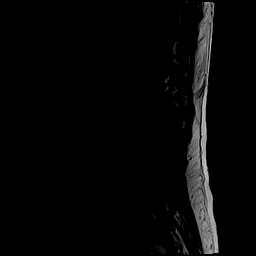
[im 11/14]
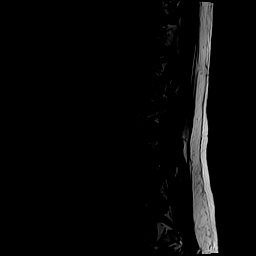
[im 14/14]
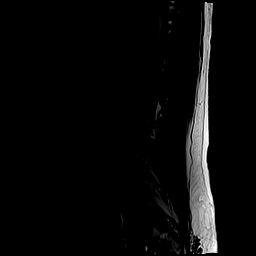

[Series 5: tirm sag · sagittal · 4.0mm · 0.55mm/px · 6 of 14 slices shown]
[im 1/14]
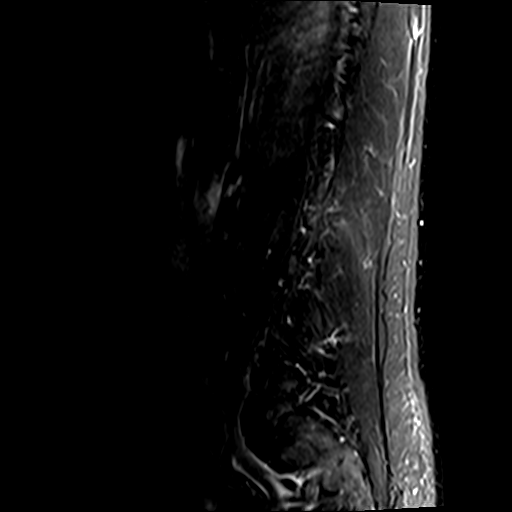
[im 3/14]
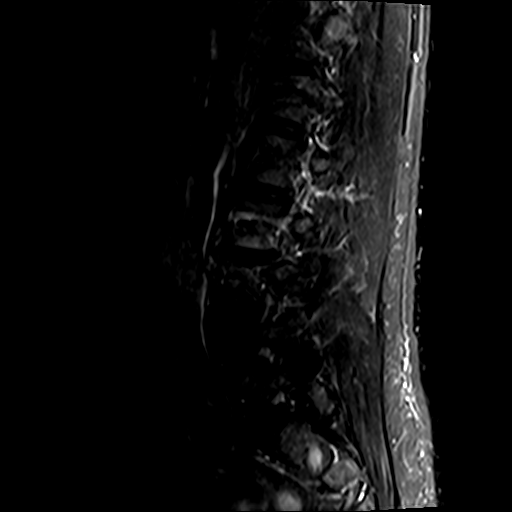
[im 6/14]
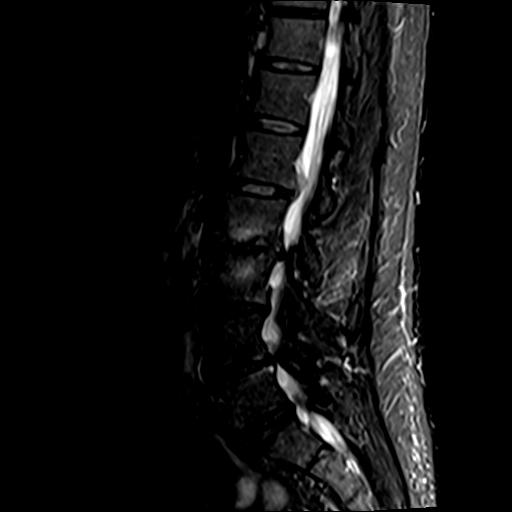
[im 8/14]
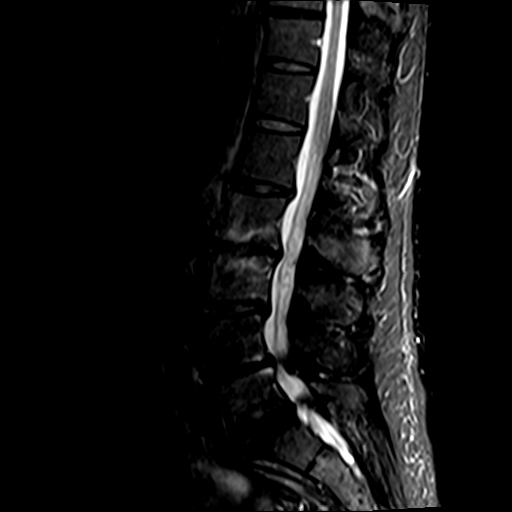
[im 11/14]
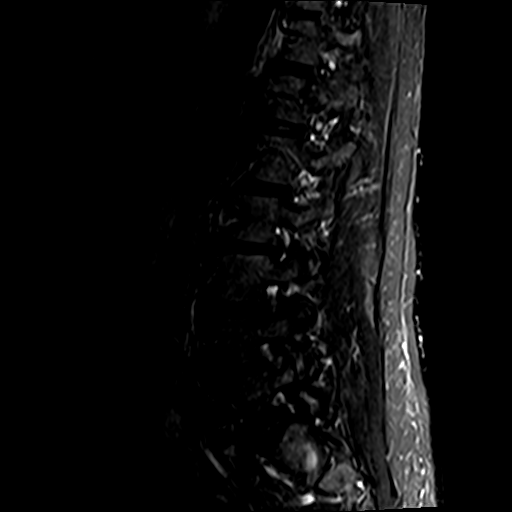
[im 14/14]
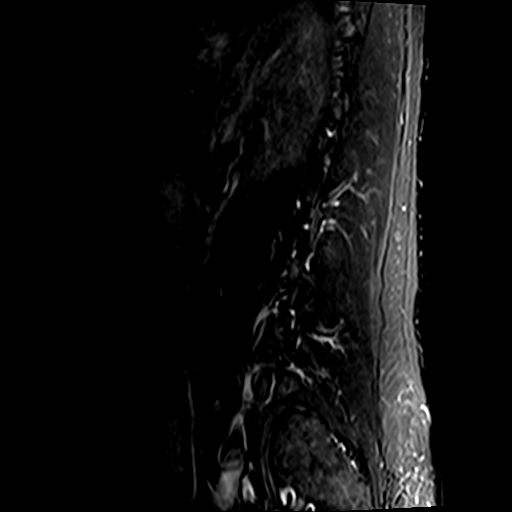

[Series 6: T1 · axial · 4.0mm · 0.78mm/px · z∈[-184,+28]mm · 10 of 34 slices shown (2 of 2)]
[im 1/34]
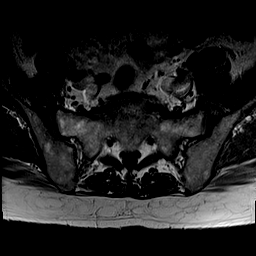
[im 3/34]
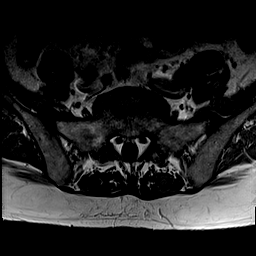
[im 5/34]
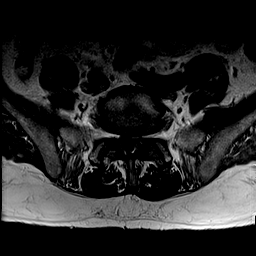
[im 10/34]
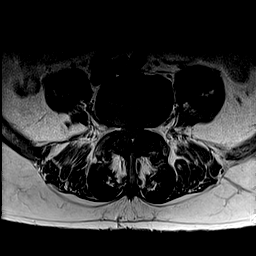
[im 15/34]
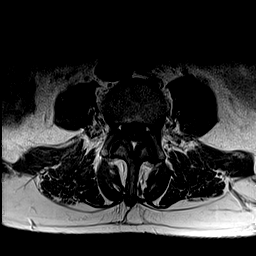
[im 17/34]
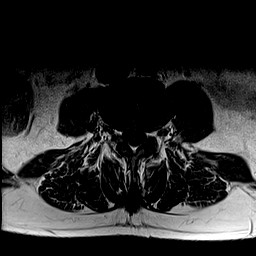
[im 19/34]
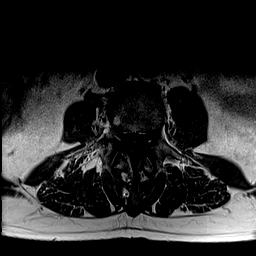
[im 24/34]
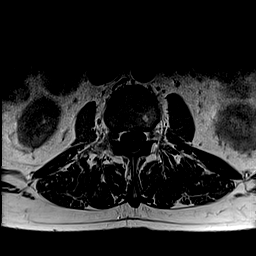
[im 29/34]
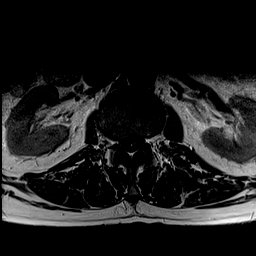
[im 34/34]
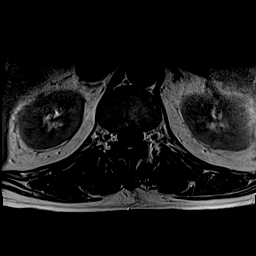

[Series 7: T2 · axial · 4.0mm · 0.78mm/px · z∈[-184,+28]mm · 15 of 34 slices shown]
[im 1/34]
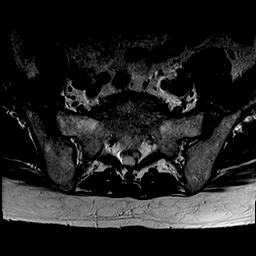
[im 3/34]
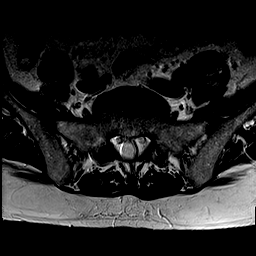
[im 5/34]
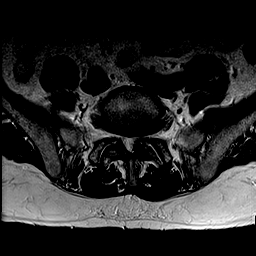
[im 8/34]
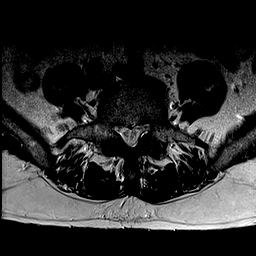
[im 10/34]
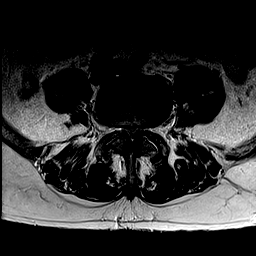
[im 12/34]
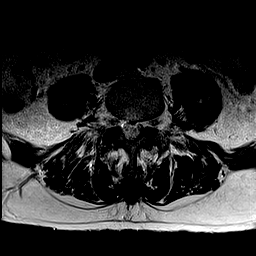
[im 15/34]
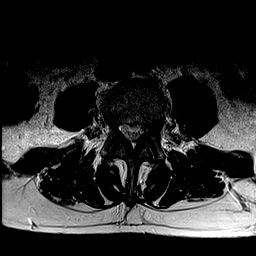
[im 17/34]
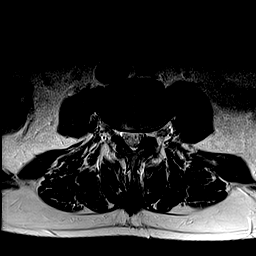
[im 19/34]
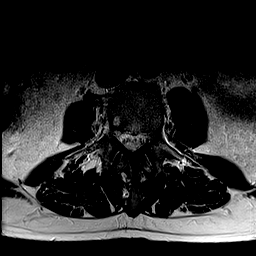
[im 22/34]
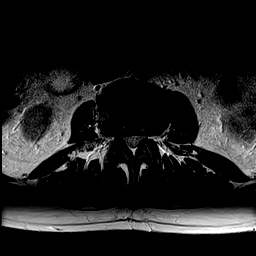
[im 24/34]
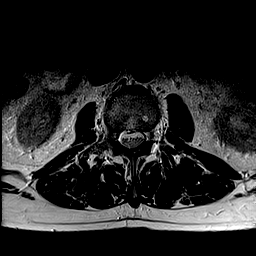
[im 26/34]
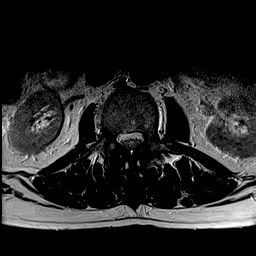
[im 29/34]
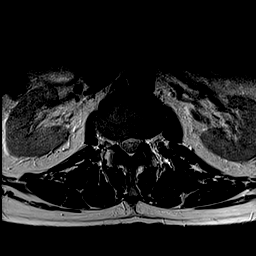
[im 31/34]
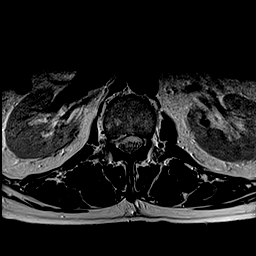
[im 34/34]
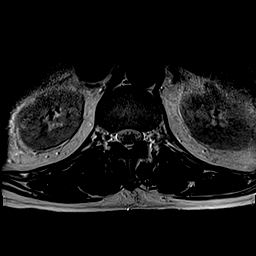

[43 of 48 positions shown; findings below may reference images not displayed]

FINDINGS: Segmentation: 5 non rib-bearing lumbar type vertebral bodies are
present. The lowest fully formed vertebral body is L5.

Alignment: Slight retrolisthesis is present at L1-2, L2-3, and L3-4.
Grade 1 anterolisthesis present at L4-5. Levoconvex curvature is
centered at L3.

Vertebrae: Chronic edematous endplate marrow changes are present at
L2-3, right greater than left. Chronic fatty endplate marrow changes
are present at L5-S1. Mild edematous changes are present posteriorly
at L3-4 and L4-5. Vertebral body heights are normal.

Conus medullaris and cauda equina: Conus extends to the L1-2 level.
Conus and cauda equina appear normal.

Paraspinal and other soft tissues: Limited imaging the abdomen is
unremarkable. There is no significant adenopathy. No solid organ
lesions are present.

Disc levels:

A right lateral disc protrusion is present. Extruded disc material
extends superiorly in the right lateral recess and into the right
foramen this likely impacts the right L1 and possibly L2 nerve
roots. Left foramen is patent.

L2-3: A broad-based disc protrusion is present. Moderate central
canal stenosis is present with right greater than left subarticular
narrowing. Mild foraminal narrowing is worse on the right.

L3-4: A broad-based disc protrusion is present. Mild facet
hypertrophy is noted bilaterally. Mild central canal narrowing is
present, left greater than right. Mild foraminal narrowing is
present bilaterally.

L4-5: Uncovering of a broad-based disc protrusion and advanced facet
hypertrophy results in severe central canal stenosis. Moderate
foraminal narrowing is present bilaterally.

L5-S1: A broad-based disc protrusion is present. Facet hypertrophy
is noted bilaterally. Mild subarticular narrowing is worse on the
left. Mild foraminal narrowing is worse on the left.
IMPRESSION: 1. Right lateral disc protrusion/extrusion at L1-2 extends
superiorly in the right lateral recess and into the right foramen
likely impacts the right L1 and possibly L2 nerve roots.
2. Severe central canal stenosis at L4-5 with moderate foraminal
narrowing bilaterally. This is the most significant central canal
stenosis.
3. Moderate central canal stenosis at L2-3 with right greater than
left subarticular narrowing. Mild foraminal narrowing is worse on
the right.
4. Mild central canal narrowing and bilateral foraminal narrowing at
L3-4.
5. Mild subarticular and foraminal narrowing bilaterally at L5-S1 is
worse on the left.

## 2021-01-31 ENCOUNTER — Other Ambulatory Visit: Payer: Self-pay | Admitting: Internal Medicine

## 2021-02-14 ENCOUNTER — Encounter: Payer: Self-pay | Admitting: Internal Medicine

## 2021-02-15 ENCOUNTER — Ambulatory Visit (INDEPENDENT_AMBULATORY_CARE_PROVIDER_SITE_OTHER): Payer: Medicare Other | Admitting: Internal Medicine

## 2021-02-15 ENCOUNTER — Encounter: Payer: Self-pay | Admitting: Internal Medicine

## 2021-02-15 ENCOUNTER — Other Ambulatory Visit: Payer: Self-pay

## 2021-02-15 VITALS — BP 132/90 | HR 60 | Temp 97.3°F | Ht 66.5 in | Wt 191.0 lb

## 2021-02-15 DIAGNOSIS — G4730 Sleep apnea, unspecified: Secondary | ICD-10-CM

## 2021-02-15 DIAGNOSIS — L570 Actinic keratosis: Secondary | ICD-10-CM

## 2021-02-15 DIAGNOSIS — G4733 Obstructive sleep apnea (adult) (pediatric): Secondary | ICD-10-CM | POA: Insufficient documentation

## 2021-02-15 NOTE — Assessment & Plan Note (Signed)
Reassured---doesn't look like cancer yet Discussed alternatives---and has to wait a month for derm Verbal consent  Cryotherapy 45 seconds x 2 Tolerated well Discussed home care  If recurs, will need excision next month

## 2021-02-15 NOTE — Progress Notes (Signed)
Subjective:    Patient ID: Brendan Bell, male    DOB: July 05, 1950, 71 y.o.   MRN: [id number]  HPI Here with wife for check of a skin lesion  Noticed a spot on right leg a few months ago Thought it was a scrape and seemed to improve Now it has changed in the past week---seems to have raised borders and "sores" on it Some itching but not pain  Did get derm appt for 2/13  No Rx  Wife also concerned about sleep apnea He stops breathing some every night--wakes her with gasp (and she has to rub his arm to get him to start breathing) Worse after he has ice cream Some daytime somnolence---especially after bad night  Current Outpatient Medications on File Prior to Visit  Medication Sig Dispense Refill   acetaminophen (TYLENOL) 650 MG CR tablet Take 650 mg by mouth every 8 (eight) hours as needed for pain.     ARIPiprazole (ABILIFY) 2 MG tablet Take 1 tablet (2 mg total) by mouth at bedtime. 90 tablet 3   aspirin EC 81 MG tablet Take 81 mg by mouth daily. Swallow whole.     Cholecalciferol (VITAMIN D3) 50 MCG (2000 UT) capsule Take 2,000 Units by mouth 2 (two) times a week.     citalopram (CELEXA) 40 MG tablet TAKE 1 TABLET BY MOUTH ONCE DAILY. 90 tablet 3   MAGNESIUM GLUCONATE PO Take 225 mg by mouth 2 (two) times a week.     melatonin 3 MG TABS tablet Take 6 mg by mouth at bedtime.     Multiple Vitamins-Minerals (MULTIVITAMIN PO) Take 1 tablet by mouth daily.     Omega-3 Fatty Acids (OMEGA 3 PO) Take 1 capsule by mouth 2 (two) times a week.     No current facility-administered medications on file prior to visit.    No Known Allergies  Past Medical History:  Diagnosis Date   Anxiety    COVID-19    Depression    Hyperlipidemia    Osteoarthritis    Pneumonia 10/04/1998   out patient   Rhinitis, allergic     Past Surgical History:  Procedure Laterality Date   COLONOSCOPY     EYE SURGERY Bilateral    IR SINUS/FIST TUBE CHK-NON GI  04/18/2020   JOINT REPLACEMENT   2/13///9/13   medial right knee///medial left knee---Dr Jefm Bryant   KNEE ARTHROSCOPY W/ PARTIAL MEDIAL MENISCECTOMY Right 2008   with pateller chondroplasty ([facility])   KNEE SURGERY  1992   right, loose body removed   KNEE SURGERY     multiple surgeries to both knees   MEDIAL PARTIAL KNEE REPLACEMENT Left    Dr. Jefm Bryant   MEDIAL PARTIAL KNEE REPLACEMENT Right    Dr. Jefm Bryant   TONSILLECTOMY     TOTAL HIP ARTHROPLASTY Right 01/08/2018   Procedure: RIGHT TOTAL HIP ARTHROPLASTY ANTERIOR APPROACH;  Surgeon: Gaynelle Arabian, MD;  Location: [facility];  Service: Orthopedics;  Laterality: Right;  140min   TOTAL KNEE ARTHROPLASTY WITH REVISION COMPONENTS Left 02/21/2015   Procedure: TOTAL KNEE ARTHROPLASTY WITH REVISION COMPONENTS;  Surgeon: Frederik Pear, MD;  Location: [facility];  Service: Orthopedics;  Laterality: Left;   XI ROBOTIC LAPAROSCOPIC ASSISTED APPENDECTOMY N/A 07/27/2020   Procedure: XI ROBOTIC LAPAROSCOPIC ASSISTED APPENDECTOMY;  Surgeon: Herbert Pun, MD;  Location: [facility];  Service: General;  Laterality: N/A;    Family History  Problem Relation Age of Onset   Heart disease Mother        CAD,  MI   Hypertension Mother    Stroke Father    Aneurysm Father        died from rupture of AAA   Cancer Paternal Grandfather        prostate    Social History   Socioeconomic History   Marital status: Married    Spouse name: Not on file   Number of children: 5   Years of education: Not on file   Highest education level: Not on file  Occupational History   Occupation: Tree surgeon    Comment: [employer]--retired   Occupation: Landlord  Tobacco Use   Smoking status: Never   Smokeless tobacco: Never  Vaping Use   Vaping Use: Never used  Substance and Sexual Activity   Alcohol use: Yes    Comment: 2 beers a day in general.   Drug use: No   Sexual activity: Not on file  Other Topics Concern   Not on file  Social History Narrative   2nd marriage.  3 children from  first marriage, 2 stepchildren, 2 with current marriage.      Has living will   Wife is health care POA--daughters Erin/Francie  ould be alternate   Would accept resuscitation   Would accept tube feeds briefly but not if cognitively unaware   Social Determinants of Health   Financial Resource Strain: Not on file  Food Insecurity: Not on file  Transportation Needs: Not on file  Physical Activity: Not on file  Stress: Not on file  Social Connections: Not on file  Intimate Partner Violence: Not on file   Review of Systems Has mole on back---no clear change. Wanted this checked as well     Objective:   Physical Exam Constitutional:      Appearance: Normal appearance.  Skin:    Comments: Seb keratosis on lower right back  Irregular 7 mm lesion with scaling on anterior right calf  Neurological:     Mental Status: He is alert.           Assessment & Plan:

## 2021-02-15 NOTE — Assessment & Plan Note (Signed)
Wife notes some apnea He has some somnolence in daytime Will set up for sleep evaluation

## 2021-03-13 DIAGNOSIS — B353 Tinea pedis: Secondary | ICD-10-CM | POA: Diagnosis not present

## 2021-03-13 DIAGNOSIS — L821 Other seborrheic keratosis: Secondary | ICD-10-CM | POA: Diagnosis not present

## 2021-03-13 DIAGNOSIS — D044 Carcinoma in situ of skin of scalp and neck: Secondary | ICD-10-CM | POA: Diagnosis not present

## 2021-03-13 DIAGNOSIS — D485 Neoplasm of uncertain behavior of skin: Secondary | ICD-10-CM | POA: Diagnosis not present

## 2021-03-13 DIAGNOSIS — L570 Actinic keratosis: Secondary | ICD-10-CM | POA: Diagnosis not present

## 2021-03-24 ENCOUNTER — Other Ambulatory Visit: Payer: Self-pay

## 2021-03-24 ENCOUNTER — Encounter: Payer: Self-pay | Admitting: Adult Health

## 2021-03-24 ENCOUNTER — Ambulatory Visit: Payer: Medicare Other | Admitting: Adult Health

## 2021-03-24 VITALS — BP 118/68 | HR 97 | Temp 97.9°F | Ht 66.5 in | Wt 193.0 lb

## 2021-03-24 DIAGNOSIS — G4719 Other hypersomnia: Secondary | ICD-10-CM | POA: Diagnosis not present

## 2021-03-24 NOTE — Progress Notes (Signed)
@Patient  ID: Brendan Bell, male    DOB: 10-06-1950, 71 y.o.   MRN: [id number]  Chief Complaint  Patient presents with   sleep consult    Referring provider: [name], MD  HPI: 71 yo male seen for sleep consult March 24, 2021 for snoring, restless sleep, daytime sleepiness and witnessed apneic events  TEST/EVENTS :   03/24/2021 sleep consult Patient presents for a sleep consult.  Patient complains that he has been having ongoing snoring, daytime sleepiness, restless sleep, witnessed apneic events from significant other.  Patient says he feels tired when he wakes up in the morning.  Does not feel rested no matter how much sleep he gets. Typically goes to bed about 9 PM.  Goes to sleep very quickly.  Is up about 2 times each night.  Usually gets up about 8 AM.  Has had minimal weight gain over the last 2 years. is never been diagnosed with sleep apnea.  Never had a sleep study. Symptoms have been going on for a long time but worse for the last year.  No symptoms suspicious for cataplexy or sleep paralysis.  Epworth score is 11.  Typically gets sleepy if he is an active or sits for any amount of time. Minimum caffeine intake.  No history of congestive heart failure or stroke.  Surgical history significant for appendectomy in 2022, right total knee replacement.   Social history patient is married.  Works with [employer].  Patient is self-employed.  Has minimal to social alcohol use.  Is not a smoker.  Denies any drug use.  Has adult children.  Family history positive for heart disease in his mother  Medical history is significant for chronic allergies, anxiety/depression   No Known Allergies  Immunization History  Administered Date(s) Administered   Fluad Quad(high Dose 65+) 11/18/2019   Hepatitis A 01/18/2016, 03/02/2016   Hepatitis B 01/18/2016, 03/02/2016   Moderna Sars-Covid-2 Vaccination 03/17/2019, 04/14/2019, 11/25/2019   Pneumococcal Conjugate-13  04/29/2019   Pneumococcal Polysaccharide-23 05/03/2020   Td 10/24/2007, 03/21/2017    Past Medical History:  Diagnosis Date   Anxiety    COVID-19    Depression    Hyperlipidemia    Osteoarthritis    Pneumonia 10/04/1998   out patient   Rhinitis, allergic     Tobacco History: Social History   Tobacco Use  Smoking Status Never  Smokeless Tobacco Never   Counseling given: Not Answered   Outpatient Medications Prior to Visit  Medication Sig Dispense Refill   acetaminophen (TYLENOL) 650 MG CR tablet Take 650 mg by mouth every 8 (eight) hours as needed for pain.     ARIPiprazole (ABILIFY) 2 MG tablet Take 1 tablet (2 mg total) by mouth at bedtime. 90 tablet 3   aspirin EC 81 MG tablet Take 81 mg by mouth daily. Swallow whole.     Cholecalciferol (VITAMIN D3) 50 MCG (2000 UT) capsule Take 2,000 Units by mouth 2 (two) times a week.     citalopram (CELEXA) 40 MG tablet TAKE 1 TABLET BY MOUTH ONCE DAILY. 90 tablet 3   MAGNESIUM GLUCONATE PO Take 225 mg by mouth 2 (two) times a week.     melatonin 3 MG TABS tablet Take 6 mg by mouth at bedtime.     Multiple Vitamins-Minerals (MULTIVITAMIN PO) Take 1 tablet by mouth daily.     Omega-3 Fatty Acids (OMEGA 3 PO) Take 1 capsule by mouth 2 (two) times a week.     No facility-administered  medications prior to visit.     Review of Systems:   Constitutional:   No  weight loss, night sweats,  Fevers, chills,  +fatigue, or  lassitude.  HEENT:   No headaches,  Difficulty swallowing,  Tooth/dental problems, or  Sore throat,                No sneezing, itching, ear ache, nasal congestion, post nasal drip,   CV:  No chest pain,  Orthopnea, PND, swelling in lower extremities, anasarca, dizziness, palpitations, syncope.   GI  No heartburn, indigestion, abdominal pain, nausea, vomiting, diarrhea, change in bowel habits, loss of appetite, bloody stools.   Resp: No shortness of breath with exertion or at rest.  No excess mucus, no productive  cough,  No non-productive cough,  No coughing up of blood.  No change in color of mucus.  No wheezing.  No chest wall deformity  Skin: no rash or lesions.  GU: no dysuria, change in color of urine, no urgency or frequency.  No flank pain, no hematuria   MS:  No joint pain or swelling.  No decreased range of motion.  No back pain.    Physical Exam  BP 118/68 (BP Location: Left Arm, Cuff Size: Normal)    Pulse 97    Temp 97.9 F (36.6 C) (Temporal)    Ht 5' 6.5" (1.689 m)    Wt 193 lb (87.5 kg)    SpO2 97%    BMI 30.68 kg/m   GEN: A/Ox3; pleasant , NAD, well nourished    HEENT:  [place]/AT,  NOSE-clear, THROAT-clear, no lesions, no postnasal drip or exudate noted.  Class II-III MP airway  NECK:  Supple w/ fair ROM; no JVD; normal carotid impulses w/o bruits; no thyromegaly or nodules palpated; no lymphadenopathy.    RESP  Clear  P & A; w/o, wheezes/ rales/ or rhonchi. no accessory muscle use, no dullness to percussion  CARD:  RRR, no m/r/g, no peripheral edema, pulses intact, no cyanosis or clubbing.  GI:   Soft & nt; nml bowel sounds; no organomegaly or masses detected.   Musco: Warm bil, no deformities or joint swelling noted.   Neuro: alert, no focal deficits noted.    Skin: Warm, no lesions or rashes    Lab Results:  CBC    BNP No results found for: BNP  ProBNP No results found for: PROBNP  Imaging: No results found.    No flowsheet data found.  No results found for: NITRICOXIDE      Assessment & Plan:   No problem-specific Assessment & Plan notes found for this encounter.     Rexene Edison, NP 03/24/2021

## 2021-03-24 NOTE — Patient Instructions (Signed)
Set up for home sleep study .  Healthy sleep regimen  Healthy weight loss Do not drive if sleepy  Follow up in 6 weeks and As needed

## 2021-03-27 DIAGNOSIS — D044 Carcinoma in situ of skin of scalp and neck: Secondary | ICD-10-CM | POA: Diagnosis not present

## 2021-03-28 DIAGNOSIS — G4719 Other hypersomnia: Secondary | ICD-10-CM | POA: Insufficient documentation

## 2021-03-28 NOTE — Assessment & Plan Note (Signed)
Excessive daytime sleepiness, snoring, witnessed apneic events all suspicious for possible underlying sleep apnea.  Patient education on sleep apnea.  We will set up for a home sleep study  - discussed how weight can impact sleep and risk for sleep disordered breathing - discussed options to assist with weight loss: combination of diet modification, cardiovascular and strength training exercises   - had an extensive discussion regarding the adverse health consequences related to untreated sleep disordered breathing - specifically discussed the risks for hypertension, coronary artery disease, cardiac dysrhythmias, cerebrovascular disease, and diabetes - lifestyle modification discussed   - discussed how sleep disruption can increase risk of accidents, particularly when driving - safe driving practices were discussed    Plan  Patient Instructions  Set up for home sleep study .  Healthy sleep regimen  Healthy weight loss Do not drive if sleepy  Follow up in 6 weeks and As needed

## 2021-04-14 DIAGNOSIS — H35363 Drusen (degenerative) of macula, bilateral: Secondary | ICD-10-CM | POA: Diagnosis not present

## 2021-04-19 ENCOUNTER — Ambulatory Visit: Payer: Medicare Other

## 2021-04-19 ENCOUNTER — Other Ambulatory Visit: Payer: Self-pay

## 2021-04-19 DIAGNOSIS — G4719 Other hypersomnia: Secondary | ICD-10-CM

## 2021-04-19 DIAGNOSIS — G4733 Obstructive sleep apnea (adult) (pediatric): Secondary | ICD-10-CM | POA: Diagnosis not present

## 2021-04-20 DIAGNOSIS — G4733 Obstructive sleep apnea (adult) (pediatric): Secondary | ICD-10-CM | POA: Diagnosis not present

## 2021-05-04 ENCOUNTER — Other Ambulatory Visit: Payer: Self-pay | Admitting: Internal Medicine

## 2021-05-04 ENCOUNTER — Ambulatory Visit: Payer: Medicare Other | Admitting: Adult Health

## 2021-05-04 ENCOUNTER — Encounter: Payer: Self-pay | Admitting: Adult Health

## 2021-05-04 VITALS — BP 110/70 | HR 75 | Temp 98.1°F | Ht 66.5 in | Wt 195.2 lb

## 2021-05-04 DIAGNOSIS — G4719 Other hypersomnia: Secondary | ICD-10-CM | POA: Diagnosis not present

## 2021-05-04 DIAGNOSIS — G4733 Obstructive sleep apnea (adult) (pediatric): Secondary | ICD-10-CM

## 2021-05-04 DIAGNOSIS — G4730 Sleep apnea, unspecified: Secondary | ICD-10-CM

## 2021-05-04 NOTE — Progress Notes (Signed)
? ?'@Patient'$  ID: Brendan Bell, male    DOB: 1950/02/06, 71 y.o.   MRN: [id number] ? ?Chief Complaint  ?Patient presents with  ? Follow-up  ? ? ?Referring provider: ?[name], MD ? ?HPI: ?71 year old male seen for sleep consult March 24, 2021 for daytime sleepiness restless sleep and snoring found to have severe obstructive sleep apnea ? ?TEST/EVENTS :  ? ?05/04/2021 Follow up: OSA  ?Patient returns for a follow-up visit.  Patient was seen for sleep consult 03/24/2021 for daytime sleepiness, restless sleep and snoring.  Set up for a home sleep study that was completed on April 19, 2021 that shows severe obstructive sleep apnea with a AHI at 31/hour and SPO2 low at 83% ?We discussed his sleep study results.  Went over treatment options including weight loss, oral appliance and CPAP.  Patient has significant symptom burden and would like to proceed with CPAP therapy.  Patient education was given on CPAP usage on CPAP care. ? ? ? ?No Known Allergies ? ?Immunization History  ?Administered Date(s) Administered  ? Fluad Quad(high Dose 65+) 11/18/2019  ? Hepatitis A 01/18/2016, 03/02/2016  ? Hepatitis B 01/18/2016, 03/02/2016  ? Moderna Sars-Covid-2 Vaccination 03/17/2019, 04/14/2019, 11/25/2019  ? Pneumococcal Conjugate-13 04/29/2019  ? Pneumococcal Polysaccharide-23 05/03/2020  ? Td 10/24/2007, 03/21/2017  ? ? ?Past Medical History:  ?Diagnosis Date  ? Anxiety   ? COVID-19   ? Depression   ? Hyperlipidemia   ? Osteoarthritis   ? Pneumonia 10/04/1998  ? out patient  ? Rhinitis, allergic   ? ? ?Tobacco History: ?Social History  ? ?Tobacco Use  ?Smoking Status Never  ?Smokeless Tobacco Never  ? ?Counseling given: Not Answered ? ? ?Outpatient Medications Prior to Visit  ?Medication Sig Dispense Refill  ? acetaminophen (TYLENOL) 650 MG CR tablet Take 650 mg by mouth every 8 (eight) hours as needed for pain.    ? ARIPiprazole (ABILIFY) 2 MG tablet TAKE ONE TABLET BY MOUTH AT BEDTIME. 90 tablet 0  ? aspirin EC 81 MG  tablet Take 81 mg by mouth daily. Swallow whole.    ? Cholecalciferol (VITAMIN D3) 50 MCG (2000 UT) capsule Take 2,000 Units by mouth 2 (two) times a week.    ? citalopram (CELEXA) 40 MG tablet TAKE 1 TABLET BY MOUTH ONCE DAILY. 90 tablet 3  ? MAGNESIUM GLUCONATE PO Take 225 mg by mouth 2 (two) times a week.    ? melatonin 3 MG TABS tablet Take 6 mg by mouth at bedtime.    ? Multiple Vitamins-Minerals (MULTIVITAMIN PO) Take 1 tablet by mouth daily.    ? Omega-3 Fatty Acids (OMEGA 3 PO) Take 1 capsule by mouth 2 (two) times a week.    ? ?No facility-administered medications prior to visit.  ? ? ? ?Review of Systems:  ? ?Constitutional:   No  weight loss, night sweats,  Fevers, chills, fatigue, or  lassitude. ? ?HEENT:   No headaches,  Difficulty swallowing,  Tooth/dental problems, or  Sore throat,  ?              No sneezing, itching, ear ache, nasal congestion, post nasal drip,  ? ?CV:  No chest pain,  Orthopnea, PND, swelling in lower extremities, anasarca, dizziness, palpitations, syncope.  ? ?GI  No heartburn, indigestion, abdominal pain, nausea, vomiting, diarrhea, change in bowel habits, loss of appetite, bloody stools.  ? ?Resp: No shortness of breath with exertion or at rest.  No excess mucus, no productive cough,  No non-productive  cough,  No coughing up of blood.  No change in color of mucus.  No wheezing.  No chest wall deformity ? ?Skin: no rash or lesions. ? ?GU: no dysuria, change in color of urine, no urgency or frequency.  No flank pain, no hematuria  ? ?MS:  No joint pain or swelling.  No decreased range of motion.  No back pain. ? ? ? ?Physical Exam ? ? ? ?GEN: A/Ox3; pleasant , NAD, well nourished  ?  ?HEENT:  [place]/AT,  EACs-clear, TMs-wnl, NOSE-clear, THROAT-clear, no lesions, no postnasal drip or exudate noted.  ? ?NECK:  Supple w/ fair ROM; no JVD; normal carotid impulses w/o bruits; no thyromegaly or nodules palpated; no lymphadenopathy.   ? ?RESP  Clear  P & A; w/o, wheezes/ rales/ or rhonchi.  no accessory muscle use, no dullness to percussion ? ?CARD:  RRR, no m/r/g, no peripheral edema, pulses intact, no cyanosis or clubbing. ? ?GI:   Soft & nt; nml bowel sounds; no organomegaly or masses detected.  ? ?Musco: Warm bil, no deformities or joint swelling noted.  ? ?Neuro: alert, no focal deficits noted.   ? ?Skin: Warm, no lesions or rashes ? ? ? ?Lab Results: ? ?CBC ?   ?Component Value Date/Time  ? WBC 5.8 07/25/2020 0848  ? RBC 5.57 07/25/2020 0848  ? HGB 16.3 07/25/2020 0848  ? HCT 49.0 07/25/2020 0848  ? PLT 179 07/25/2020 0848  ? MCV 88.0 07/25/2020 0848  ? MCH 29.3 07/25/2020 0848  ? MCHC 33.3 07/25/2020 0848  ? RDW 14.2 07/25/2020 0848  ? LYMPHSABS 2.7 02/10/2015 0941  ? MONOABS 0.6 02/10/2015 0941  ? EOSABS 0.1 02/10/2015 0941  ? BASOSABS 0.0 02/10/2015 0941  ? ? ?BMET ?   ?Component Value Date/Time  ? NA 136 04/11/2020 0525  ? K 3.4 (L) 04/11/2020 0525  ? CL 104 04/11/2020 0525  ? CO2 26 04/11/2020 0525  ? GLUCOSE 87 04/11/2020 0525  ? BUN 6 (L) 04/11/2020 0525  ? CREATININE 0.96 04/11/2020 0525  ? CALCIUM 7.8 (L) 04/11/2020 0525  ? GFRNONAA >60 04/11/2020 0525  ? GFRAA >60 01/09/2018 0411  ? ? ?BNP ?No results found for: BNP ? ?ProBNP ?No results found for: PROBNP ? ?Imaging: ?No results found. ? ? ? ?   ? View : No data to display.  ?  ?  ?  ? ? ?No results found for: NITRICOXIDE ? ? ? ? ? ?Assessment & Plan:  ? ?No problem-specific Assessment & Plan notes found for this encounter. ? ? ? ? ?Rexene Edison, NP ?05/04/2021 ? ?

## 2021-05-04 NOTE — Patient Instructions (Addendum)
Begin CPAP at bedtime ?Try dreamwear nasal or full face.  ?Try to wear your CPAP all night long. ?Healthy sleep regimen ?Do not drive if sleepy ?Remain active and work on healthy weight loss ?Follow-up in 2 to 3 months and as needed ?

## 2021-05-05 NOTE — Assessment & Plan Note (Signed)
Severe OSA - patient education given  ?Begin Auto CPAP 5-15cm H2O  ? ?- discussed how weight can impact sleep and risk for sleep disordered breathing ?- discussed options to assist with weight loss: combination of diet modification, cardiovascular and strength training exercises ?  ?- had an extensive discussion regarding the adverse health consequences related to untreated sleep disordered breathing ?- specifically discussed the risks for hypertension, coronary artery disease, cardiac dysrhythmias, cerebrovascular disease, and diabetes ?- lifestyle modification discussed ?  ?- discussed how sleep disruption can increase risk of accidents, particularly when driving ?- safe driving practices were discussed ?   ? ?Plan  ?Patient Instructions  ?Begin CPAP at bedtime ?Try dreamwear nasal or full face.  ?Try to wear your CPAP all night long. ?Healthy sleep regimen ?Do not drive if sleepy ?Remain active and work on healthy weight loss ?Follow-up in 2 to 3 months and as needed ?  ? ?

## 2021-05-08 DIAGNOSIS — H2511 Age-related nuclear cataract, right eye: Secondary | ICD-10-CM | POA: Diagnosis not present

## 2021-05-09 ENCOUNTER — Encounter: Payer: Medicare Other | Admitting: Internal Medicine

## 2021-05-15 ENCOUNTER — Encounter: Payer: Self-pay | Admitting: Ophthalmology

## 2021-05-17 DIAGNOSIS — G4733 Obstructive sleep apnea (adult) (pediatric): Secondary | ICD-10-CM | POA: Diagnosis not present

## 2021-05-17 DIAGNOSIS — R269 Unspecified abnormalities of gait and mobility: Secondary | ICD-10-CM | POA: Diagnosis not present

## 2021-05-18 NOTE — Discharge Instructions (Signed)

## 2021-05-23 ENCOUNTER — Ambulatory Visit
Admission: RE | Admit: 2021-05-23 | Discharge: 2021-05-23 | Disposition: A | Discharge status: Home / Self Care | Payer: Medicare Other | Source: Ambulatory Visit | Attending: Ophthalmology | Admitting: Ophthalmology

## 2021-05-23 ENCOUNTER — Other Ambulatory Visit: Payer: Self-pay

## 2021-05-23 ENCOUNTER — Encounter
Admission: RE | Disposition: A | Discharge status: Home / Self Care | Payer: Self-pay | Source: Ambulatory Visit | Attending: Ophthalmology

## 2021-05-23 ENCOUNTER — Ambulatory Visit: Payer: Medicare Other | Admitting: Anesthesiology

## 2021-05-23 ENCOUNTER — Encounter: Payer: Self-pay | Admitting: Ophthalmology

## 2021-05-23 DIAGNOSIS — F32A Depression, unspecified: Secondary | ICD-10-CM | POA: Insufficient documentation

## 2021-05-23 DIAGNOSIS — F419 Anxiety disorder, unspecified: Secondary | ICD-10-CM | POA: Diagnosis not present

## 2021-05-23 DIAGNOSIS — Z8616 Personal history of COVID-19: Secondary | ICD-10-CM | POA: Insufficient documentation

## 2021-05-23 DIAGNOSIS — H2511 Age-related nuclear cataract, right eye: Secondary | ICD-10-CM | POA: Insufficient documentation

## 2021-05-23 DIAGNOSIS — G4730 Sleep apnea, unspecified: Secondary | ICD-10-CM | POA: Diagnosis not present

## 2021-05-23 DIAGNOSIS — H25811 Combined forms of age-related cataract, right eye: Secondary | ICD-10-CM | POA: Diagnosis not present

## 2021-05-23 HISTORY — DX: Headache, unspecified: R51.9

## 2021-05-23 HISTORY — DX: Sleep apnea, unspecified: G47.30

## 2021-05-23 HISTORY — PX: CATARACT EXTRACTION W/PHACO: SHX586

## 2021-05-23 SURGERY — PHACOEMULSIFICATION, CATARACT, WITH IOL INSERTION
Anesthesia: Monitor Anesthesia Care | Site: Eye | Laterality: Right

## 2021-05-23 MED ORDER — SIGHTPATH DOSE#1 NA CHONDROIT SULF-NA HYALURON 40-17 MG/ML IO SOLN
INTRAOCULAR | Status: DC | PRN
  Administered 2021-05-23: 1 mL via INTRAOCULAR

## 2021-05-23 MED ORDER — TETRACAINE HCL 0.5 % OP SOLN
1.0000 [drp] | OPHTHALMIC | Status: AC | PRN
  Administered 2021-05-23 (×3): 1 [drp] via OPHTHALMIC

## 2021-05-23 MED ORDER — LACTATED RINGERS IV SOLN: INTRAVENOUS | Status: DC

## 2021-05-23 MED ORDER — SIGHTPATH DOSE#1 BSS IO SOLN
INTRAOCULAR | Status: DC | PRN
  Administered 2021-05-23: 59 mL via OPHTHALMIC

## 2021-05-23 MED ORDER — BRIMONIDINE TARTRATE-TIMOLOL 0.2-0.5 % OP SOLN
OPHTHALMIC | Status: DC | PRN
Start: 2021-05-23 — End: 2021-05-23
  Administered 2021-05-23: 1 [drp] via OPHTHALMIC

## 2021-05-23 MED ORDER — ARMC OPHTHALMIC DILATING DROPS
1.0000 "application " | OPHTHALMIC | Status: AC | PRN
  Administered 2021-05-23 (×3): 1 via OPHTHALMIC

## 2021-05-23 MED ORDER — MIDAZOLAM HCL 2 MG/2ML IJ SOLN
INTRAMUSCULAR | Status: DC | PRN
Start: 2021-05-23 — End: 2021-05-23
  Administered 2021-05-23: 2 mg via INTRAVENOUS

## 2021-05-23 MED ORDER — FENTANYL CITRATE (PF) 100 MCG/2ML IJ SOLN
INTRAMUSCULAR | Status: DC | PRN
  Administered 2021-05-23: 50 ug via INTRAVENOUS

## 2021-05-23 MED ORDER — SIGHTPATH DOSE#1 BSS IO SOLN
INTRAOCULAR | Status: DC | PRN
  Administered 2021-05-23: 15 mL

## 2021-05-23 MED ORDER — MOXIFLOXACIN HCL 0.5 % OP SOLN
OPHTHALMIC | Status: DC | PRN
  Administered 2021-05-23: 0.2 mL via OPHTHALMIC

## 2021-05-23 MED ORDER — SIGHTPATH DOSE#1 BSS IO SOLN
INTRAOCULAR | Status: DC | PRN
  Administered 2021-05-23: 1 mL via INTRAMUSCULAR

## 2021-05-23 SURGICAL SUPPLY — 12 items
CATARACT SUITE SIGHTPATH (MISCELLANEOUS) ×2 IMPLANT
FEE CATARACT SUITE SIGHTPATH (MISCELLANEOUS) ×1 IMPLANT
GLOVE SURG ENC TEXT LTX SZ8 (GLOVE) ×2 IMPLANT
GLOVE SURG TRIUMPH 8.0 PF LTX (GLOVE) ×2 IMPLANT
LENS IOL ACRSF IQ VT 15 19.0 IMPLANT
LENS IOL ACRYSOF VIVITY 19.0 ×2 IMPLANT
LENS IOL VIVITY 015 19.0 ×1 IMPLANT
NDL FILTER BLUNT 18X1 1/2 (NEEDLE) ×1 IMPLANT
NEEDLE FILTER BLUNT 18X 1/2SAF (NEEDLE) ×1
NEEDLE FILTER BLUNT 18X1 1/2 (NEEDLE) ×1 IMPLANT
SYR 3ML LL SCALE MARK (SYRINGE) ×2 IMPLANT
WATER STERILE IRR 250ML POUR (IV SOLUTION) ×2 IMPLANT

## 2021-05-23 NOTE — Transfer of Care (Signed)
Immediate Anesthesia Transfer of Care Note ? ?Patient: Brendan Bell ? ?Procedure(s) Performed: CATARACT EXTRACTION PHACO AND INTRAOCULAR LENS PLACEMENT (IOC) RIGHT vivity lens 4.61 00:33.1 (Right: Eye) ? ?Patient Location: PACU ? ?Anesthesia Type: MAC ? ?Level of Consciousness: awake, alert  and patient cooperative ? ?Airway and Oxygen Therapy: Patient Spontanous Breathing and Patient connected to supplemental oxygen ? ?Post-op Assessment: Post-op Vital signs reviewed, Patient's Cardiovascular Status Stable, Respiratory Function Stable, Patent Airway and No signs of Nausea or vomiting ? ?Post-op Vital Signs: Reviewed and stable ? ?Complications: No notable events documented. ? ?

## 2021-05-23 NOTE — Anesthesia Preprocedure Evaluation (Signed)
Anesthesia Evaluation  ?Patient identified by MRN, date of birth, ID band ?Patient awake ? ? ? ?Reviewed: ?Allergy & Precautions, H&P , NPO status , Patient's Chart, lab work & pertinent test results ? ?Airway ?Mallampati: II ? ?TM Distance: >3 FB ?Neck ROM: full ? ? ? Dental ?no notable dental hx. ? ?  ?Pulmonary ?sleep apnea ,  ?  ?Pulmonary exam normal ? ? ? ? ? ? ? Cardiovascular ?negative cardio ROS ?Normal cardiovascular exam ? ? ?  ?Neuro/Psych ? Headaches, PSYCHIATRIC DISORDERS Anxiety Depression   ? GI/Hepatic ?negative GI ROS, Neg liver ROS,   ?Endo/Other  ?negative endocrine ROS ? Renal/GU ?negative Renal ROS  ?negative genitourinary ?  ?Musculoskeletal ? ?(+) Arthritis ,  ? Abdominal ?(+) + obese,   ?Peds ? Hematology ?negative hematology ROS ?(+)   ?Anesthesia Other Findings ? ? Reproductive/Obstetrics ?negative OB ROS ? ?  ? ? ? ? ? ? ? ? ? ? ? ? ? ?  ?  ? ? ? ? ? ? ? ? ?Anesthesia Physical ? ?Anesthesia Plan ? ?ASA: 2 ? ?Anesthesia Plan: MAC  ? ?Post-op Pain Management: Minimal or no pain anticipated  ? ?Induction: Intravenous ? ?PONV Risk Score and Plan: 1 and Midazolam, TIVA and Treatment may vary due to age or medical condition ? ?Airway Management Planned: Nasal Cannula and Natural Airway ? ?Additional Equipment: None ? ?Intra-op Plan:  ? ?Post-operative Plan:  ? ?Informed Consent: I have reviewed the patients History and Physical, chart, labs and discussed the procedure including the risks, benefits and alternatives for the proposed anesthesia with the patient or authorized representative who has indicated his/her understanding and acceptance.  ? ? ? ?Dental Advisory Given ? ?Plan Discussed with: CRNA ? ?Anesthesia Plan Comments:   ? ? ? ? ? ? ?Anesthesia Quick Evaluation ? ?

## 2021-05-23 NOTE — Op Note (Signed)
PREOPERATIVE DIAGNOSIS:  Nuclear sclerotic cataract of the right eye. ?  ?POSTOPERATIVE DIAGNOSIS:  H25.11 Cataract ?  ?OPERATIVE PROCEDURE:ORPROCALL@ ?  ?SURGEON:  Birder Robson, MD. ?  ?ANESTHESIA: ? ?Anesthesiologist: Ardeth Sportsman, MD ?CRNA: Mayme Genta, CRNA ? ?1.      Managed anesthesia care. ?2.      0.68m of Shugarcaine was instilled in the eye following the paracentesis. ?  ?COMPLICATIONS:  None. ?  ?TECHNIQUE:   Stop and chop ?  ?DESCRIPTION OF PROCEDURE:  The patient was examined and consented in the preoperative holding area where the aforementioned topical anesthesia was applied to the right eye and then brought back to the Operating Room where the right eye was prepped and draped in the usual sterile ophthalmic fashion and a lid speculum was placed. A paracentesis was created with the side port blade and the anterior chamber was filled with viscoelastic. A near clear corneal incision was performed with the steel keratome. A continuous curvilinear capsulorrhexis was performed with a cystotome followed by the capsulorrhexis forceps. Hydrodissection and hydrodelineation were carried out with BSS on a blunt cannula. The lens was removed in a stop and chop  technique and the remaining cortical material was removed with the irrigation-aspiration handpiece. The capsular bag was inflated with viscoelastic and the Technis [id number]  lens was placed in the capsular bag without complication. The remaining viscoelastic was removed from the eye with the irrigation-aspiration handpiece. The wounds were hydrated. The anterior chamber was flushed with BSS and the eye was inflated to physiologic pressure. 0.156mof Vigamox was placed in the anterior chamber. The wounds were found to be water tight. The eye was dressed with Combigan. The patient was given protective glasses to wear throughout the day and a shield with which to sleep tonight. The patient was also given drops with which to begin a drop regimen today and  will follow-up with me in one day. ?Implant Name Type Inv. Item Serial No. Manufacturer Lot No. LRB No. Used Action  ?LENS IOL ACRYSOF VIVITY 19.0 - [id number] IOL ACRYSOF VIVITY 19.0 [id number]  Right 1 Implanted  ? ?Procedure(s): ?CATARACT EXTRACTION PHACO AND INTRAOCULAR LENS PLACEMENT (IOC) RIGHT vivity lens 4.61 00:33.1 (Right) ? ?Electronically signed: WiBirder Robson/25/2023 12:48 PM ? ?

## 2021-05-23 NOTE — Anesthesia Procedure Notes (Signed)
Procedure Name: [place] ?Date/Time: 05/23/2021 12:34 PM ?Performed by: Mayme Genta, CRNA ?Pre-anesthesia Checklist: Patient identified, Emergency Drugs available, Suction available, Timeout performed and Patient being monitored ?Patient Re-evaluated:Patient Re-evaluated prior to induction ?Oxygen Delivery Method: Nasal cannula ?Placement Confirmation: positive ETCO2 ? ? ? ? ?

## 2021-05-23 NOTE — Anesthesia Postprocedure Evaluation (Signed)
Anesthesia Post Note ? ?Patient: Brendan Bell ? ?Procedure(s) Performed: CATARACT EXTRACTION PHACO AND INTRAOCULAR LENS PLACEMENT (IOC) RIGHT vivity lens 4.61 00:33.1 (Right: Eye) ? ? ?  ?Patient location during evaluation: PACU ?Anesthesia Type: MAC ?Level of consciousness: awake and alert ?Pain management: pain level controlled ?Vital Signs Assessment: post-procedure vital signs reviewed and stable ?Respiratory status: spontaneous breathing and nonlabored ventilation ?Cardiovascular status: blood pressure returned to baseline ?Postop Assessment: no apparent nausea or vomiting ?Anesthetic complications: no ? ? ?No notable events documented. ? ?Ardeth Sportsman ? ? ? ? ? ?

## 2021-05-23 NOTE — H&P (Signed)
[facility]  ? ?Primary Care Physician:  [name], MD ?Ophthalmologist: Dr. George Ina ? ?Pre-Procedure History & Physical: ?HPI:  Brendan Bell is a 71 y.o. male here for cataract surgery. ?  ?Past Medical History:  ?Diagnosis Date  ? Anxiety   ? COVID-19   ? Depression   ? Headache   ? silent migraines  ? Hyperlipidemia   ? Osteoarthritis   ? Pneumonia 10/04/1998  ? out patient  ? Rhinitis, allergic   ? Sleep apnea   ? being fitted for a CPAP 05/17/21  ? ? ?Past Surgical History:  ?Procedure Laterality Date  ? COLONOSCOPY    ? EYE SURGERY Bilateral   ? IR SINUS/FIST TUBE CHK-NON GI  04/18/2020  ? JOINT REPLACEMENT  2/13///9/13  ? medial right knee///medial left knee---Dr Jefm Bryant  ? KNEE ARTHROSCOPY W/ PARTIAL MEDIAL MENISCECTOMY Right 2008  ? with pateller chondroplasty ([facility])  ? KNEE SURGERY  1992  ? right, loose body removed  ? KNEE SURGERY    ? multiple surgeries to both knees  ? MEDIAL PARTIAL KNEE REPLACEMENT Left   ? Dr. Jefm Bryant  ? MEDIAL PARTIAL KNEE REPLACEMENT Right   ? Dr. Jefm Bryant  ? TONSILLECTOMY    ? TOTAL HIP ARTHROPLASTY Right 01/08/2018  ? Procedure: RIGHT TOTAL HIP ARTHROPLASTY ANTERIOR APPROACH;  Surgeon: Gaynelle Arabian, MD;  Location: [facility];  Service: Orthopedics;  Laterality: Right;  175mn  ? TOTAL KNEE ARTHROPLASTY WITH REVISION COMPONENTS Left 02/21/2015  ? Procedure: TOTAL KNEE ARTHROPLASTY WITH REVISION COMPONENTS;  Surgeon: FFrederik Pear MD;  [place] [facility]  Service: Orthopedics;  Laterality: Left;  ? XI ROBOTIC LAPAROSCOPIC ASSISTED APPENDECTOMY N/A 07/27/2020  ? Procedure: XI ROBOTIC LAPAROSCOPIC ASSISTED APPENDECTOMY;  Surgeon: CHerbert Pun MD;  Location: [facility];  Service: General;  Laterality: N/A;  ? ? ?Prior to Admission medications   ?Medication Sig Start Date End Date Taking? Authorizing Provider  ?acetaminophen (TYLENOL) 650 MG CR tablet Take 650 mg by mouth every 8 (eight) hours as needed for pain.   Yes [provider]  ?ARIPiprazole  (ABILIFY) 2 MG tablet TAKE ONE TABLET BY MOUTH AT BEDTIME. 05/04/21  Yes LVenia Carbon MD  ?aspirin EC 81 MG tablet Take 81 mg by mouth daily. Swallow whole.   Yes [provider]  ?Cholecalciferol (VITAMIN D3) 50 MCG (2000 UT) capsule Take 2,000 Units by mouth 2 (two) times a week.   Yes [provider]  ?citalopram (CELEXA) 40 MG tablet TAKE 1 TABLET BY MOUTH ONCE DAILY. 10/31/20  Yes LVenia Carbon MD  ?MAGNESIUM GLUCONATE PO Take 225 mg by mouth 2 (two) times a week.   Yes [provider]  ?Multiple Vitamins-Minerals (MULTIVITAMIN PO) Take 1 tablet by mouth daily.   Yes [provider]  ?Omega-3 Fatty Acids (OMEGA 3 PO) Take 1 capsule by mouth 2 (two) times a week.   Yes [provider]  ?melatonin 3 MG TABS tablet Take 6 mg by mouth at bedtime. ?Patient not taking: Reported on 05/15/2021    [provider]  ? ? ?Allergies as of 04/17/2021  ? (No Known Allergies)  ? ? ?Family History  ?Problem Relation Age of Onset  ? Heart disease Mother   ?     CAD, MI  ? Hypertension Mother   ? Stroke Father   ? Aneurysm Father   ?     died from rupture of AAA  ? Cancer Paternal Grandfather   ?  prostate  ? ? ?Social History  ? ?Socioeconomic History  ? Marital status: Married  ?  Spouse name: Not on file  ? Number of children: 5  ? Years of education: Not on file  ? Highest education level: Not on file  ?Occupational History  ? Occupation: Tree surgeon  ?  Comment: [employer]--retired  ? Occupation: Landlord  ?Tobacco Use  ? Smoking status: Never  ? Smokeless tobacco: Never  ?Vaping Use  ? Vaping Use: Never used  ?Substance and Sexual Activity  ? Alcohol use: Yes  ?  Alcohol/week: 14.0 standard drinks  ?  Types: 14 Cans of beer per week  ?  Comment: 2 beers a day in general.  ? Drug use: No  ? Sexual activity: Not on file  ?Other Topics Concern  ? Not on file  ?Social History Narrative  ? 2nd marriage.  3 children from first marriage, 2 stepchildren, 2 with  current marriage.  ?   ? Has living will  ? Wife is health care POA--daughters Erin/Francie  ould be alternate  ? Would accept resuscitation  ? Would accept tube feeds briefly but not if cognitively unaware  ? ?Social Determinants of Health  ? ?Financial Resource Strain: Not on file  ?Food Insecurity: Not on file  ?Transportation Needs: Not on file  ?Physical Activity: Not on file  ?Stress: Not on file  ?Social Connections: Not on file  ?Intimate Partner Violence: Not on file  ? ? ?Review of Systems: ?See HPI, otherwise negative ROS ? ?Physical Exam: ?BP 127/84   Pulse 64   Temp (!) 97.5 ?F (36.4 ?C) (Temporal)   Resp 20   Ht 5' 6.5" (1.689 m)   Wt 88 kg   SpO2 96%   BMI 30.84 kg/m?  ?General:   Alert, cooperative in NAD ?Head:  Normocephalic and atraumatic. ?Respiratory:  Normal work of breathing. ?Cardiovascular:  RRR ? ?Impression/Plan: ?GABREAL WORTON is here for cataract surgery. ? ?Risks, benefits, limitations, and alternatives regarding cataract surgery have been reviewed with the patient.  Questions have been answered.  All parties agreeable. ? ? ?Birder Robson, MD  05/23/2021, 12:24 PM ? ? ?

## 2021-06-16 DIAGNOSIS — G4733 Obstructive sleep apnea (adult) (pediatric): Secondary | ICD-10-CM | POA: Diagnosis not present

## 2021-06-16 DIAGNOSIS — R269 Unspecified abnormalities of gait and mobility: Secondary | ICD-10-CM | POA: Diagnosis not present

## 2021-06-28 ENCOUNTER — Ambulatory Visit (INDEPENDENT_AMBULATORY_CARE_PROVIDER_SITE_OTHER): Payer: Medicare Other | Admitting: Internal Medicine

## 2021-06-28 ENCOUNTER — Encounter: Payer: Self-pay | Admitting: Internal Medicine

## 2021-06-28 VITALS — BP 128/78 | HR 65 | Temp 97.9°F | Ht 66.0 in | Wt 195.0 lb

## 2021-06-28 DIAGNOSIS — G4733 Obstructive sleep apnea (adult) (pediatric): Secondary | ICD-10-CM

## 2021-06-28 DIAGNOSIS — F331 Major depressive disorder, recurrent, moderate: Secondary | ICD-10-CM

## 2021-06-28 DIAGNOSIS — Z8673 Personal history of transient ischemic attack (TIA), and cerebral infarction without residual deficits: Secondary | ICD-10-CM | POA: Diagnosis not present

## 2021-06-28 DIAGNOSIS — Z0000 Encounter for general adult medical examination without abnormal findings: Secondary | ICD-10-CM | POA: Diagnosis not present

## 2021-06-28 DIAGNOSIS — Z125 Encounter for screening for malignant neoplasm of prostate: Secondary | ICD-10-CM

## 2021-06-28 LAB — CBC
HCT: 47.5 % (ref 39.0–52.0)
Hemoglobin: 15.6 g/dL (ref 13.0–17.0)
MCHC: 33 g/dL (ref 30.0–36.0)
MCV: 89.7 fl (ref 78.0–100.0)
Platelets: 165 10*3/uL (ref 150.0–400.0)
RBC: 5.29 Mil/uL (ref 4.22–5.81)
RDW: 14.5 % (ref 11.5–15.5)
WBC: 6.6 10*3/uL (ref 4.0–10.5)

## 2021-06-28 LAB — COMPREHENSIVE METABOLIC PANEL
ALT: 17 U/L (ref 0–53)
AST: 19 U/L (ref 0–37)
Albumin: 4.3 g/dL (ref 3.5–5.2)
Alkaline Phosphatase: 57 U/L (ref 39–117)
BUN: 14 mg/dL (ref 6–23)
CO2: 32 mEq/L (ref 19–32)
Calcium: 9.5 mg/dL (ref 8.4–10.5)
Chloride: 100 mEq/L (ref 96–112)
Creatinine, Ser: 1.14 mg/dL (ref 0.40–1.50)
GFR: 65.96 mL/min (ref >60.00)
Glucose, Bld: 76 mg/dL (ref 70–99)
Potassium: 4.6 mEq/L (ref 3.5–5.1)
Sodium: 137 mEq/L (ref 135–145)
Total Bilirubin: 0.7 mg/dL (ref 0.2–1.2)
Total Protein: 6.8 g/dL (ref 6.0–8.3)

## 2021-06-28 LAB — LIPID PANEL
Cholesterol: 201 mg/dL — ABNORMAL HIGH (ref 0–200)
HDL: 50.9 mg/dL (ref >39.00)
LDL Cholesterol: 129 mg/dL — ABNORMAL HIGH (ref 0–99)
NonHDL: 150.49
Total CHOL/HDL Ratio: 4
Triglycerides: 107 mg/dL (ref 0.0–149.0)
VLDL: 21.4 mg/dL (ref 0.0–40.0)

## 2021-06-28 LAB — PSA, MEDICARE: PSA: 1.72 ng/ml (ref 0.10–4.00)

## 2021-06-28 NOTE — Assessment & Plan Note (Signed)
I have personally reviewed the Medicare Annual Wellness questionnaire and have noted 1. The patient's medical and social history 2. Their use of alcohol, tobacco or illicit drugs 3. Their current medications and supplements 4. The patient's functional ability including ADL's, fall risks, home safety risks and hearing or visual             impairment. 5. Diet and physical activities 6. Evidence for depression or mood disorders  The patients weight, height, BMI and visual acuity have been recorded in the chart I have made referrals, counseling and provided education to the patient based review of the above and I have provided the pt with a written personalized care plan for preventive services.  I have provided you with a copy of your personalized plan for preventive services. Please take the time to review along with your updated medication list.  Consider last colon 2031 Discussed PSA --will check He is going to increase exercise again Consider shingrix at the [place] and flu vaccines by the fall

## 2021-06-28 NOTE — Progress Notes (Signed)
Subjective:    Patient ID: Brendan Bell, male    DOB: [birth year], 71 y.o.   MRN: [id number]  HPI Here for Medicare wellness visit and follow up of chronic health conditions Reviewed advanced directives Reviewed other doctors---Dr Kim--derm, Dr Olen Cordial Dr Brigid Re, Tammy Parrett---pulm/sleep, Dr Vena Austria, [facility], Dr Zenovia Jarred medicine Had cataract done in April---had to cut exercise then. Did have appendectomy 6/22. 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 No hospitalizations Has regular beer--but limiting now for calories Vision is good Hearing is fine No falls Depression controlled Independent with instrumental ADLs No apparent memory problems   Concerned about his belly fat Planning to restart more aggressive exercise Discussed limiting carbs  Had sleep study Now using CPAP---every night Awakens with more energy--though still some decreased mental clarity at times Does awaken with stiffness in back--fom sleeping in same position now  Depression still controlled Same 2 meds  Current Outpatient Medications on File Prior to Visit  Medication Sig Dispense Refill   acetaminophen (TYLENOL) 650 MG CR tablet Take 650 mg by mouth every 8 (eight) hours as needed for pain.     ARIPiprazole (ABILIFY) 2 MG tablet TAKE ONE TABLET BY MOUTH AT BEDTIME. 90 tablet 0   aspirin EC 81 MG tablet Take 81 mg by mouth daily. Swallow whole.     Cholecalciferol (VITAMIN D3) 50 MCG (2000 UT) capsule Take 2,000 Units by mouth 2 (two) times a week.     citalopram (CELEXA) 40 MG tablet TAKE 1 TABLET BY MOUTH ONCE DAILY. 90 tablet 3   econazole nitrate 1 % cream Apply topically.     MAGNESIUM GLUCONATE PO Take 225 mg by mouth 2 (two) times a week.     Multiple Vitamins-Minerals (MULTIVITAMIN PO) Take 1 tablet by mouth daily.     Omega-3 Fatty Acids (OMEGA 3 PO) Take 1 capsule by mouth 2 (two) times a week.     No current facility-administered medications on file  prior to visit.    No Known Allergies  Past Medical History:  Diagnosis Date   Anxiety    COVID-19    Depression    Headache    silent migraines   Hyperlipidemia    Osteoarthritis    Pneumonia 10/04/1998   out patient   Rhinitis, allergic    Sleep apnea    being fitted for a CPAP 05/17/21    Past Surgical History:  Procedure Laterality Date   CATARACT EXTRACTION W/PHACO Right 05/23/2021   Procedure: CATARACT EXTRACTION PHACO AND INTRAOCULAR LENS PLACEMENT (IOC) RIGHT vivity lens 4.61 00:33.1;  Surgeon: Birder Robson, MD;  Location: [place];  Service: Ophthalmology;  Laterality: Right;   COLONOSCOPY     EYE SURGERY Bilateral    IR SINUS/FIST TUBE CHK-NON GI  04/18/2020   JOINT REPLACEMENT  2/13///9/13   medial right knee///medial left knee---Dr Jefm Bryant   KNEE ARTHROSCOPY W/ PARTIAL MEDIAL MENISCECTOMY Right 2008   with pateller chondroplasty ([facility])   KNEE SURGERY  1992   right, loose body removed   KNEE SURGERY     multiple surgeries to both knees   MEDIAL PARTIAL KNEE REPLACEMENT Left    Dr. Jefm Bryant   MEDIAL PARTIAL KNEE REPLACEMENT Right    Dr. Jefm Bryant   TONSILLECTOMY     TOTAL HIP ARTHROPLASTY Right 01/08/2018   Procedure: RIGHT TOTAL HIP ARTHROPLASTY ANTERIOR APPROACH;  Surgeon: Gaynelle Arabian, MD;  Location: [facility];  Service: Orthopedics;  Laterality: Right;  135mn   TOTAL KNEE ARTHROPLASTY WITH REVISION COMPONENTS  Left 02/21/2015   Procedure: TOTAL KNEE ARTHROPLASTY WITH REVISION COMPONENTS;  Surgeon: Frederik Pear, MD;  Location: [place];  Service: Orthopedics;  Laterality: Left;   XI ROBOTIC LAPAROSCOPIC ASSISTED APPENDECTOMY N/A 07/27/2020   Procedure: XI ROBOTIC LAPAROSCOPIC ASSISTED APPENDECTOMY;  Surgeon: Herbert Pun, MD;  Location: [facility];  Service: General;  Laterality: N/A;    Family History  Problem Relation Age of Onset   Heart disease Mother        CAD, MI   Hypertension Mother    Stroke Father    Aneurysm Father         died from rupture of AAA   Cancer Paternal Grandfather        prostate    Social History   Socioeconomic History   Marital status: Married    Spouse name: Not on file   Number of children: 5   Years of education: Not on file   Highest education level: Not on file  Occupational History   Occupation: Tree surgeon    Comment: [employer]--retired   Occupation: Landlord  Tobacco Use   Smoking status: Never    Passive exposure: Past   Smokeless tobacco: Never  Vaping Use   Vaping Use: Never used  Substance and Sexual Activity   Alcohol use: Yes    Alcohol/week: 14.0 standard drinks    Types: 14 Cans of beer per week    Comment: 2 beers a day in general.   Drug use: No   Sexual activity: Not on file  Other Topics Concern   Not on file  Social History Narrative   2nd marriage.  3 children from first marriage, 2 stepchildren, 2 with current marriage.      Has living will   Wife is health care POA--daughters Erin/Francie  ould be alternate   Would accept resuscitation   Would accept tube feeds briefly but not if cognitively unaware   Social Determinants of Health   Financial Resource Strain: Not on file  Food Insecurity: Not on file  Transportation Needs: Not on file  Physical Activity: Not on file  Stress: Not on file  Social Connections: Not on file  Intimate Partner Violence: Not on file   Review of Systems Appetite is good Weight stable Wears seat belt Teeth okay--keeps up with dentist Has econazole for peeling feet and bad nails No heartburn or dysphagia Bowels are slower--every other day. No blood Urinary stream is okay. Nocturia only once now with CPAP No suspicious skin lesions. Did have skin burn on face recently (peel) No other joint pains     Objective:   Physical Exam Constitutional:      Appearance: Normal appearance.  HENT:     Mouth/Throat:     Comments: No lesions Eyes:     Conjunctiva/sclera: Conjunctivae normal.     Pupils: Pupils  are equal, round, and reactive to light.  Cardiovascular:     Rate and Rhythm: Normal rate and regular rhythm.     Pulses: Normal pulses.     Heart sounds: No murmur heard.   No gallop.  Pulmonary:     Effort: Pulmonary effort is normal.     Breath sounds: Normal breath sounds. No wheezing or rales.  Abdominal:     Palpations: Abdomen is soft.     Tenderness: There is no abdominal tenderness.  Musculoskeletal:     Cervical back: Neck supple.     Right lower leg: No edema.     Left lower leg: No  edema.  Lymphadenopathy:     Cervical: No cervical adenopathy.  Skin:    Findings: No lesion or rash.  Neurological:     General: No focal deficit present.     Mental Status: He is alert and oriented to person, place, and time.     Comments: Mini-cog normal  Psychiatric:        Mood and Affect: Mood normal.        Behavior: Behavior normal.           Assessment & Plan:

## 2021-06-28 NOTE — Progress Notes (Signed)
Hearing Screening - Comments:: Passed whisper test Vision Screening - Comments:: April 2023  

## 2021-06-28 NOTE — Assessment & Plan Note (Signed)
Still pretty much in remission on citalopram 40 and aripiprazole 2 daily

## 2021-06-28 NOTE — Assessment & Plan Note (Signed)
No neuro symptoms Prefers no statin Omega 3 fish oil and ASA 81

## 2021-06-28 NOTE — Assessment & Plan Note (Signed)
Now sleeps with CPAP nightly

## 2021-07-17 DIAGNOSIS — R269 Unspecified abnormalities of gait and mobility: Secondary | ICD-10-CM | POA: Diagnosis not present

## 2021-07-17 DIAGNOSIS — G4733 Obstructive sleep apnea (adult) (pediatric): Secondary | ICD-10-CM | POA: Diagnosis not present

## 2021-07-28 ENCOUNTER — Ambulatory Visit: Payer: Medicare Other | Admitting: Primary Care

## 2021-07-28 ENCOUNTER — Encounter: Payer: Self-pay | Admitting: Primary Care

## 2021-07-28 DIAGNOSIS — G4733 Obstructive sleep apnea (adult) (pediatric): Secondary | ICD-10-CM

## 2021-07-28 NOTE — Progress Notes (Signed)
$'@Patient'Z$  ID: Brendan Bell, male    DOB: 13-Jul-1950, 71 y.o.   MRN: [id number]  Chief Complaint  Patient presents with   Follow-up    Wearing cpap avg 9hr nightly--pressure and mask is okay.     Referring provider: [name], MD  HPI: 71 year old male, never smoked.  Past medical history significant for obstructive sleep apnea, allergic rhinitis, TIA, hyperlipidemia, depression.  07/28/2021 Patient presents today for 2 to 35-monthfollow-up/OSA.  Patient had sleep study on April 19, 2021 that showed evidence of severe obstructive sleep apnea, AHI 31/hour with SPO2 low 83%.  He was started on CPAP back in April 2023.  Patient has been compliant with CPAP use.  Current pressure settings auto titrate 5 to 15 cm H2O without significant residual apneas.  He is doing very well. He is compliance with CPAP use. No issues with pressure setting or mask fit. Daytime sleeping has significantly improved since starting on CPAP therapy. He rarely gets tired during the day. Sleeping well at night.   Airview download 06/27/2021 - 07/26/2021 28/30 days (93%) > 4 hours Average usage 8 hours 18 minutes Pressure 5 to 15 cm H2O (11.4cm h20-95%) Air leaks 0.5L/min AHI 1.8  No Known Allergies  Immunization History  Administered Date(s) Administered   Fluad Quad(high Dose 65+) 11/18/2019   Hepatitis A 01/18/2016, 03/02/2016   Hepatitis B 01/18/2016, 03/02/2016   Moderna Sars-Covid-2 Vaccination 03/17/2019, 04/14/2019, 11/25/2019   Pneumococcal Conjugate-13 04/29/2019   Pneumococcal Polysaccharide-23 05/03/2020   Td 10/24/2007, 03/21/2017    Past Medical History:  Diagnosis Date   Anxiety    COVID-19    Depression    Headache    silent migraines   Hyperlipidemia    Osteoarthritis    Pneumonia 10/04/1998   out patient   Rhinitis, allergic    Sleep apnea    being fitted for a CPAP 05/17/21    Tobacco History: Social History   Tobacco Use  Smoking Status Never   Passive  exposure: Past  Smokeless Tobacco Never   Counseling given: Not Answered   Outpatient Medications Prior to Visit  Medication Sig Dispense Refill   acetaminophen (TYLENOL) 650 MG CR tablet Take 650 mg by mouth every 8 (eight) hours as needed for pain.     ARIPiprazole (ABILIFY) 2 MG tablet TAKE ONE TABLET BY MOUTH AT BEDTIME. 90 tablet 0   aspirin EC 81 MG tablet Take 81 mg by mouth daily. Swallow whole.     Cholecalciferol (VITAMIN D3) 50 MCG (2000 UT) capsule Take 2,000 Units by mouth 2 (two) times a week.     citalopram (CELEXA) 40 MG tablet TAKE 1 TABLET BY MOUTH ONCE DAILY. 90 tablet 3   econazole nitrate 1 % cream Apply topically.     MAGNESIUM GLUCONATE PO Take 225 mg by mouth 2 (two) times a week.     Multiple Vitamins-Minerals (MULTIVITAMIN PO) Take 1 tablet by mouth daily.     Omega-3 Fatty Acids (OMEGA 3 PO) Take 1 capsule by mouth 2 (two) times a week.     No facility-administered medications prior to visit.    Review of Systems  Review of Systems  Constitutional: Negative.  Negative for fatigue.  HENT: Negative.    Respiratory: Negative.      Physical Exam  BP 126/78 (BP Location: Left Arm, Cuff Size: Normal)   Pulse 78   Temp 97.7 F (36.5 C) (Temporal)   Ht 5' 6.5" (1.689 m)   Wt 197 lb (  89.4 kg)   SpO2 97%   BMI 31.32 kg/m  Physical Exam Constitutional:      General: He is not in acute distress.    Appearance: Normal appearance. He is not ill-appearing.  HENT:     Head: Normocephalic and atraumatic.     Mouth/Throat:     Mouth: Mucous membranes are moist.     Pharynx: Oropharynx is clear.  Cardiovascular:     Rate and Rhythm: Normal rate and regular rhythm.  Pulmonary:     Effort: Pulmonary effort is normal.     Breath sounds: Normal breath sounds.  Musculoskeletal:        General: Normal range of motion.  Skin:    General: Skin is warm and dry.  Neurological:     General: No focal deficit present.     Mental Status: He is alert and oriented  to person, place, and time. Mental status is at baseline.  Psychiatric:        Mood and Affect: Mood normal.        Behavior: Behavior normal.        Thought Content: Thought content normal.        Judgment: Judgment normal.      Lab Results:  CBC    Component Value Date/Time   WBC 6.6 06/28/2021 1218   RBC 5.29 06/28/2021 1218   HGB 15.6 06/28/2021 1218   HCT 47.5 06/28/2021 1218   PLT 165.0 06/28/2021 1218   MCV 89.7 06/28/2021 1218   MCH 29.3 07/25/2020 0848   MCHC 33.0 06/28/2021 1218   RDW 14.5 06/28/2021 1218   LYMPHSABS 2.7 02/10/2015 0941   MONOABS 0.6 02/10/2015 0941   EOSABS 0.1 02/10/2015 0941   BASOSABS 0.0 02/10/2015 0941    BMET    Component Value Date/Time   NA 137 06/28/2021 1218   K 4.6 06/28/2021 1218   CL 100 06/28/2021 1218   CO2 32 06/28/2021 1218   GLUCOSE 76 06/28/2021 1218   BUN 14 06/28/2021 1218   CREATININE 1.14 06/28/2021 1218   CALCIUM 9.5 06/28/2021 1218   GFRNONAA >60 04/11/2020 0525   GFRAA >60 01/09/2018 0411    BNP No results found for: "BNP"  ProBNP No results found for: "PROBNP"  Imaging: No results found.   Assessment & Plan:   Obstructive sleep apnea - Sleep study on 04/19/2021 showed severe OSA, AHI 31/hr. Started on CPAP in April 2023.  Patient is 93% compliant with CPAP and reports benefit from use. Epworth score 2/24 (11/24).  No issues with mask fit or pressure settings.  Average usage 8 hours 18 minutes.  Current pressure setting auto titrate 5 to 15 cm H2O; Residual AHI 1.8/hr.  No changes today.  Encourage patient continue to wear CPAP every night for minimum 4 to 6 hours or longer.  Advised against driving experiencing excessive daytime sleepiness or fatigue.  Follow-up in 1 year or sooner if needed   Martyn Ehrich, NP 07/28/2021

## 2021-07-28 NOTE — Assessment & Plan Note (Addendum)
-   Sleep study on 04/19/2021 showed severe OSA, AHI 31/hr. Started on CPAP in April 2023.  Patient is 93% compliant with CPAP and reports benefit from use. Epworth score 2/24 (11/24).  No issues with mask fit or pressure settings.  Average usage 8 hours 18 minutes.  Current pressure setting auto titrate 5 to 15 cm H2O; Residual AHI 1.8/hr.  No changes today.  Encourage patient continue to wear CPAP every night for minimum 4 to 6 hours or longer.  Advised against driving experiencing excessive daytime sleepiness or fatigue.  Follow-up in 1 year or sooner if needed

## 2021-07-28 NOTE — Patient Instructions (Addendum)
Excellent job wearing CPAP.  Sleep apnea is currently well controlled on auto CPAP.  No pressure changes needed today.    Recommendations  Continue to wear CPAP every night for minimum of 4 to 6 hours or longer Focus on side sleeping position or elevate head of bed 30 degrees  Avoid excessive alcohol or sedating medications prior to bedtime Do not drive experiencing excessive daytime sleepiness or fatigue  Follow-up April 2024 with Tammy or [facility] NP/ or sooner if needed   CPAP and BIPAP Information CPAP and BIPAP are methods that use air pressure to keep your airways open and to help you breathe well. CPAP and BIPAP use different amounts of pressure. Your health care provider will tell you whether CPAP or BIPAP would be more helpful for you. CPAP stands for "continuous positive airway pressure." With CPAP, the amount of pressure stays the same while you breathe in (inhale) and out (exhale). BIPAP stands for "bi-level positive airway pressure." With BIPAP, the amount of pressure will be higher when you inhale and lower when you exhale. This allows you to take larger breaths. CPAP or BIPAP may be used in the hospital, or your health care provider may want you to use it at home. You may need to have a sleep study before your health care provider can order a machine for you to use at home. What are the advantages? CPAP or BIPAP can be helpful if you have: Sleep apnea. Chronic obstructive pulmonary disease (COPD). Heart failure. Medical conditions that cause muscle weakness, including muscular dystrophy or amyotrophic lateral sclerosis (ALS). Other problems that cause breathing to be shallow, weak, abnormal, or difficult. CPAP and BIPAP are most commonly used for obstructive sleep apnea (OSA) to keep the airways from collapsing when the muscles relax during sleep. What are the risks? Generally, this is a safe treatment. However, problems may occur, including: Irritated skin or skin sores if the  mask does not fit properly. Dry or stuffy nose or nosebleeds. Dry mouth. Feeling gassy or bloated. Sinus or lung infection if the equipment is not cleaned properly. When should CPAP or BIPAP be used? In most cases, the mask only needs to be worn during sleep. Generally, the mask needs to be worn throughout the night and during any daytime naps. People with certain medical conditions may also need to wear the mask at other times, such as when they are awake. Follow instructions from your health care provider about when to use the machine. What happens during CPAP or BIPAP?  Both CPAP and BIPAP are provided by a small machine with a flexible plastic tube that attaches to a plastic mask that you wear. Air is blown through the mask into your nose or mouth. The amount of pressure that is used to blow the air can be adjusted on the machine. Your health care provider will set the pressure setting and help you find the best mask for you. Tips for using the mask Because the mask needs to be snug, some people feel trapped or closed-in (claustrophobic) when first using the mask. If you feel this way, you may need to get used to the mask. One way to do this is to hold the mask loosely over your nose or mouth and then gradually apply the mask more snugly. You can also gradually increase the amount of time that you use the mask. Masks are available in various types and sizes. If your mask does not fit well, talk with your health care provider  about getting a different one. Some common types of masks include: Full face masks, which fit over the mouth and nose. Nasal masks, which fit over the nose. Nasal pillow or prong masks, which fit into the nostrils. If you are using a mask that fits over your nose and you tend to breathe through your mouth, a chin strap may be applied to help keep your mouth closed. Use a skin barrier to protect your skin as told by your health care provider. Some CPAP and BIPAP machines  have alarms that may sound if the mask comes off or develops a leak. If you have trouble with the mask, it is very important that you talk with your health care provider about finding a way to make the mask easier to tolerate. Do not stop using the mask. There could be a negative impact on your health if you stop using the mask. Tips for using the machine Place your CPAP or BIPAP machine on a secure table or stand near an electrical outlet. Know where the on/off switch is on the machine. Follow instructions from your health care provider about how to set the pressure on your machine and when you should use it. Do not eat or drink while the CPAP or BIPAP machine is on. Food or fluids could get pushed into your lungs by the pressure of the CPAP or BIPAP. For home use, CPAP and BIPAP machines can be rented or purchased through home health care companies. Many different brands of machines are available. Renting a machine before purchasing may help you find out which particular machine works well for you. Your health insurance company may also decide which machine you may get. Keep the CPAP or BIPAP machine and attachments clean. Ask your health care provider for specific instructions. Check the humidifier if you have a dry stuffy nose or nosebleeds. Make sure it is working correctly. Follow these instructions at home: Take over-the-counter and prescription medicines only as told by your health care provider. Ask if you can take sinus medicine if your sinuses are blocked. Do not use any products that contain nicotine or tobacco. These products include cigarettes, chewing tobacco, and vaping devices, such as e-cigarettes. If you need help quitting, ask your health care provider. Keep all follow-up visits. This is important. Contact a health care provider if: You have redness or pressure sores on your head, face, mouth, or nose from the mask or head gear. You have trouble using the CPAP or BIPAP  machine. You cannot tolerate wearing the CPAP or BIPAP mask. Someone tells you that you snore even when wearing your CPAP or BIPAP. Get help right away if: You have trouble breathing. You feel confused. Summary CPAP and BIPAP are methods that use air pressure to keep your airways open and to help you breathe well. If you have trouble with the mask, it is very important that you talk with your health care provider about finding a way to make the mask easier to tolerate. Do not stop using the mask. There could be a negative impact to your health if you stop using the mask. Follow instructions from your health care provider about when to use the machine. This information is not intended to replace advice given to you by your health care provider. Make sure you discuss any questions you have with your health care provider. Document Revised: 08/24/2020 Document Reviewed: 12/25/2019 Elsevier Patient Education  [place].

## 2021-07-30 NOTE — Progress Notes (Signed)
Reviewed and agree with assessment/plan.   Chesley Mires, MD [facility] Pulmonary/Critical Care 07/30/2021, 10:56 AM Pager:  [phone]

## 2021-08-14 ENCOUNTER — Other Ambulatory Visit: Payer: Self-pay | Admitting: Internal Medicine

## 2021-08-14 ENCOUNTER — Ambulatory Visit: Payer: Medicare Other | Admitting: Adult Health

## 2021-08-16 DIAGNOSIS — G4733 Obstructive sleep apnea (adult) (pediatric): Secondary | ICD-10-CM | POA: Diagnosis not present

## 2021-08-16 DIAGNOSIS — R269 Unspecified abnormalities of gait and mobility: Secondary | ICD-10-CM | POA: Diagnosis not present

## 2021-09-16 DIAGNOSIS — R269 Unspecified abnormalities of gait and mobility: Secondary | ICD-10-CM | POA: Diagnosis not present

## 2021-09-16 DIAGNOSIS — G4733 Obstructive sleep apnea (adult) (pediatric): Secondary | ICD-10-CM | POA: Diagnosis not present

## 2021-10-04 DIAGNOSIS — G4733 Obstructive sleep apnea (adult) (pediatric): Secondary | ICD-10-CM | POA: Diagnosis not present

## 2021-10-17 DIAGNOSIS — G4733 Obstructive sleep apnea (adult) (pediatric): Secondary | ICD-10-CM | POA: Diagnosis not present

## 2021-10-17 DIAGNOSIS — R269 Unspecified abnormalities of gait and mobility: Secondary | ICD-10-CM | POA: Diagnosis not present

## 2021-11-03 ENCOUNTER — Telehealth: Payer: Self-pay

## 2021-11-03 MED ORDER — CITALOPRAM HYDROBROMIDE 40 MG PO TABS: 40.0000 mg | ORAL_TABLET | Freq: Every day | ORAL | 3 refills | Status: DC

## 2021-11-03 NOTE — Telephone Encounter (Signed)
Rx sent electronically.  

## 2021-11-16 DIAGNOSIS — R269 Unspecified abnormalities of gait and mobility: Secondary | ICD-10-CM | POA: Diagnosis not present

## 2021-11-16 DIAGNOSIS — G4733 Obstructive sleep apnea (adult) (pediatric): Secondary | ICD-10-CM | POA: Diagnosis not present

## 2021-12-13 DIAGNOSIS — H2512 Age-related nuclear cataract, left eye: Secondary | ICD-10-CM | POA: Diagnosis not present

## 2021-12-17 DIAGNOSIS — G4733 Obstructive sleep apnea (adult) (pediatric): Secondary | ICD-10-CM | POA: Diagnosis not present

## 2021-12-17 DIAGNOSIS — R269 Unspecified abnormalities of gait and mobility: Secondary | ICD-10-CM | POA: Diagnosis not present

## 2022-01-03 DIAGNOSIS — G4733 Obstructive sleep apnea (adult) (pediatric): Secondary | ICD-10-CM | POA: Diagnosis not present

## 2022-01-11 DIAGNOSIS — C44319 Basal cell carcinoma of skin of other parts of face: Secondary | ICD-10-CM | POA: Diagnosis not present

## 2022-01-11 DIAGNOSIS — D485 Neoplasm of uncertain behavior of skin: Secondary | ICD-10-CM | POA: Diagnosis not present

## 2022-01-11 DIAGNOSIS — L578 Other skin changes due to chronic exposure to nonionizing radiation: Secondary | ICD-10-CM | POA: Diagnosis not present

## 2022-01-11 DIAGNOSIS — Z08 Encounter for follow-up examination after completed treatment for malignant neoplasm: Secondary | ICD-10-CM | POA: Diagnosis not present

## 2022-01-11 DIAGNOSIS — Z85828 Personal history of other malignant neoplasm of skin: Secondary | ICD-10-CM | POA: Diagnosis not present

## 2022-01-16 DIAGNOSIS — R269 Unspecified abnormalities of gait and mobility: Secondary | ICD-10-CM | POA: Diagnosis not present

## 2022-01-16 DIAGNOSIS — G4733 Obstructive sleep apnea (adult) (pediatric): Secondary | ICD-10-CM | POA: Diagnosis not present

## 2022-02-01 ENCOUNTER — Ambulatory Visit: Payer: Medicare Other | Admitting: Family Medicine

## 2022-02-01 VITALS — BP 122/82 | HR 68 | Ht 66.5 in | Wt 200.0 lb

## 2022-02-01 DIAGNOSIS — M9908 Segmental and somatic dysfunction of rib cage: Secondary | ICD-10-CM

## 2022-02-01 DIAGNOSIS — M9902 Segmental and somatic dysfunction of thoracic region: Secondary | ICD-10-CM | POA: Insufficient documentation

## 2022-02-01 DIAGNOSIS — M546 Pain in thoracic spine: Secondary | ICD-10-CM | POA: Insufficient documentation

## 2022-02-01 MED ORDER — METHYLPREDNISOLONE ACETATE 80 MG/ML IJ SUSP
80.0000 mg | Freq: Once | INTRAMUSCULAR | Status: AC
  Administered 2022-02-01: 80 mg via INTRAMUSCULAR

## 2022-02-01 MED ORDER — PREDNISONE 20 MG PO TABS: 20.0000 mg | ORAL_TABLET | Freq: Every day | ORAL | 0 refills | Status: DC

## 2022-02-01 MED ORDER — TIZANIDINE HCL 2 MG PO TABS: 2.0000 mg | ORAL_TABLET | Freq: Every day | ORAL | 0 refills | Status: DC

## 2022-02-01 MED ORDER — KETOROLAC TROMETHAMINE 60 MG/2ML IM SOLN
60.0000 mg | Freq: Once | INTRAMUSCULAR | Status: AC
  Administered 2022-02-01: 60 mg via INTRAMUSCULAR

## 2022-02-01 NOTE — Progress Notes (Signed)
[place] Phone: [phone] Subjective:    I'm seeing this patient by the request  of:  [name], MD  CC: back pain   [id number]  Brendan Bell is a 72 y.o. male coming in with complaint of back pain. Patents plays golf a couple times a week. Patient thinks he strained it using a trainer club. Does not remember anything happening during the training session, but did wake up with the pain in his right lower shoulder blade pain for a couple weeks now, denies any neck pain but the pain will radiate down to his right side when it gets too bad. Patient taking ibuprofen for the pain.   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 and moderate at L2-L3    Past Medical History:  Diagnosis Date   Anxiety    COVID-19    Depression    Headache    silent migraines   Hyperlipidemia    Osteoarthritis    Pneumonia 10/04/1998   out patient   Rhinitis, allergic    Sleep apnea    being fitted for a CPAP 05/17/21   Past Surgical History:  Procedure Laterality Date   CATARACT EXTRACTION W/PHACO Right 05/23/2021   Procedure: CATARACT EXTRACTION PHACO AND INTRAOCULAR LENS PLACEMENT (IOC) RIGHT vivity lens 4.61 00:33.1;  Surgeon: Birder Robson, MD;  Location: [facility];  Service: Ophthalmology;  Laterality: Right;   COLONOSCOPY     EYE SURGERY Bilateral    IR SINUS/FIST TUBE CHK-NON GI  04/18/2020   JOINT REPLACEMENT  2/13///9/13   medial right knee///medial left knee---Dr Jefm Bryant   KNEE ARTHROSCOPY W/ PARTIAL MEDIAL MENISCECTOMY Right 2008   with pateller chondroplasty ([facility])   KNEE SURGERY  1992   right, loose body removed   KNEE SURGERY     multiple surgeries to both knees   MEDIAL PARTIAL KNEE REPLACEMENT Left    Dr. Jefm Bryant   MEDIAL PARTIAL KNEE REPLACEMENT Right    Dr. Jefm Bryant   TONSILLECTOMY     TOTAL HIP ARTHROPLASTY Right 01/08/2018    Procedure: RIGHT TOTAL HIP ARTHROPLASTY ANTERIOR APPROACH;  Surgeon: Gaynelle Arabian, MD;  Location: [facility];  Service: Orthopedics;  Laterality: Right;  180mn   TOTAL KNEE ARTHROPLASTY WITH REVISION COMPONENTS Left 02/21/2015   Procedure: TOTAL KNEE ARTHROPLASTY WITH REVISION COMPONENTS;  Surgeon: FFrederik Pear MD;  [place] 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   Drug use: No   Sexual activity: Not on  file  Other Topics Concern   Not on file  Social History Narrative   2nd marriage.  3 children from first marriage, 2 stepchildren, 2 with current marriage.      Has living will   Wife is health care POA--daughters Erin/Francie  ould be alternate   Would accept resuscitation   Would accept tube feeds briefly but not if cognitively unaware   Social Determinants of Health   Financial Resource Strain: Not on file  Food Insecurity: Not on file  Transportation Needs: Not on file  Physical Activity: Not on file  Stress: Not on file  Social Connections: Not on file   No Known Allergies Family History  Problem Relation Age of Onset   Heart disease Mother         CAD, MI   Hypertension Mother    Stroke Father    Aneurysm Father        died from rupture of AAA   Cancer Paternal Grandfather        prostate    Current Outpatient Medications (Endocrine & Metabolic):    predniSONE (DELTASONE) 20 MG tablet, Take 1 tablet (20 mg total) by mouth daily.    Current Outpatient Medications (Analgesics):    acetaminophen (TYLENOL) 650 MG CR tablet, Take 650 mg by mouth every 8 (eight) hours as needed for pain.   aspirin EC 81 MG tablet, Take 81 mg by mouth daily. Swallow whole.   Current Outpatient Medications (Other):    ARIPiprazole (ABILIFY) 2 MG tablet, TAKE ONE TABLET BY MOUTH AT BEDTIME.   Cholecalciferol (VITAMIN D3) 50 MCG (2000 UT) capsule, Take 2,000 Units by mouth 2 (two) times a week.   citalopram (CELEXA) 40 MG tablet, Take 1 tablet (40 mg total) by mouth daily.   econazole nitrate 1 % cream, Apply topically.   MAGNESIUM GLUCONATE PO, Take 225 mg by mouth 2 (two) times a week.   Multiple Vitamins-Minerals (MULTIVITAMIN PO), Take 1 tablet by mouth daily.   Omega-3 Fatty Acids (OMEGA 3 PO), Take 1 capsule by mouth 2 (two) times a week.   tiZANidine (ZANAFLEX) 2 MG tablet, Take 1 tablet (2 mg total) by mouth at bedtime.   Reviewed prior external information including notes and imaging from  primary care provider As well as notes that were available from care everywhere and other healthcare systems.  Past medical history, social, surgical and family history all reviewed in electronic medical record.  No pertanent information unless stated regarding to the chief complaint.   Review of Systems:  No headache, visual changes, nausea, vomiting, diarrhea, constipation, dizziness, abdominal pain, skin rash, fevers, chills, night sweats, weight loss, swollen lymph nodes, body aches, joint swelling, chest pain, shortness of breath, mood changes. POSITIVE muscle aches  Objective  Blood pressure 122/82, pulse 68, height 5' 6.5" (1.689 m), weight 200  lb (90.7 kg), SpO2 96 %.   General: No apparent distress alert and oriented x3 mood and affect normal, dressed appropriately.  HEENT: Pupils equal, extraocular movements intact  Respiratory: Patient's speak in full sentences and does not appear short of breath  Cardiovascular: No lower extremity edema, non tender, no erythema  Low back exam shows significant tightness noted and seems to be the more in the thoracic area right greater than left.  Seems to be on the anterior aspect of the scapula and somewhat to the medial aspect of the scapula. No abdominal pain on exam  Osteopathic findings T5 extended rotated and side bent right with inhaled  rib T8 extended rotated and side bent right T9 extended rotated and side bent right    Impression and Recommendations:     The above documentation has been reviewed and is accurate and complete Lyndal Pulley, DO

## 2022-02-01 NOTE — Assessment & Plan Note (Signed)
Acute thoracic pain, not midline, no fevers, chills, any recent illnesses, no cough or shortness of breath, patient has no significant weakness but multiple trigger points noted in the musculature.  Patient given Toradol and Depo-Medrol today.  Attempted osteopathic manipulation with varying degrees of success.  Short course of anti-inflammatories and prednisone given as well as muscle relaxers.  Follow-up with me again in 2 to 3 weeks

## 2022-02-01 NOTE — Patient Instructions (Addendum)
Good to see you  Injection in backside today Prednisone '20mg'$  for 5 days start tomorrow  Tizanidine '2mg'$  nightly as needed  Follow up in 4 weeks

## 2022-02-01 NOTE — Assessment & Plan Note (Signed)
   Decision today to treat with OMT was based on Physical Exam  After verbal consent patient was treated with HVLA, ME, FPR techniques in  thoracic, rib,  areas,   Patient tolerated the procedure well with improvement in symptoms  Patient given exercises, stretches and lifestyle modifications  See medications in patient instructions if given  Patient will follow up in 4-8 weeks

## 2022-02-16 DIAGNOSIS — G4733 Obstructive sleep apnea (adult) (pediatric): Secondary | ICD-10-CM | POA: Diagnosis not present

## 2022-02-16 DIAGNOSIS — R269 Unspecified abnormalities of gait and mobility: Secondary | ICD-10-CM | POA: Diagnosis not present

## 2022-02-23 NOTE — Progress Notes (Unsigned)
  [place] Phone: [phone] Subjective:   Fontaine No, am serving as a scribe for Dr. Hulan Saas.  I'm seeing this patient by the request  of:  [name], MD  CC: Neck and back pain follow-up  [id number]  CHAMP KEETCH is a 72 y.o. male coming in with complaint of back and neck pain. OMT 02/01/2022. Patient states that he is doing much better. Does experience pain intermittently. Notices more ROM when swinging his golf club.   Medications patient has been prescribed: Prednisone, Zanaflex  Taking:         Reviewed prior external information including notes and imaging from previsou exam, outside providers and external EMR if available.   As well as notes that were available from care everywhere and other healthcare systems.  Past medical history, social, surgical and family history all reviewed in electronic medical record.  No pertanent information unless stated regarding to the chief complaint.   Past Medical History:  Diagnosis Date   Anxiety    COVID-19    Depression    Headache    silent migraines   Hyperlipidemia    Osteoarthritis    Pneumonia 10/04/1998   out patient   Rhinitis, allergic    Sleep apnea    being fitted for a CPAP 05/17/21    No Known Allergies   Review of Systems:  No headache, visual changes, nausea, vomiting, diarrhea, constipation, dizziness, abdominal pain, skin rash, fevers, chills, night sweats, weight loss, swollen lymph nodes, body aches, joint swelling, chest pain, shortness of breath, mood changes. POSITIVE muscle aches  Objective  Blood pressure 118/82, pulse 61, height 5' 6.5" (1.689 m), weight 199 lb (90.3 kg).   General: No apparent distress alert and oriented x3 mood and affect normal, dressed appropriately.  HEENT: Pupils equal, extraocular movements intact  Respiratory: Patient's speak in full sentences and does not appear short of  breath  Cardiovascular: No lower extremity edema, non tender, no erythema   Osteopathic findings  C3 flexed rotated and side bent right C7 flexed rotated and side bent left T3 extended rotated and side bent right inhaled rib T11 extended rotated and side bent left L1 flexed rotated and side bent right    Assessment and Plan:  Acute thoracic back pain Discussed with patient at great length.  Discussed icing regimen and home exercises.  Discussed which activities to do and which ones to avoid.  Still having some difficulty with rotation of the back.  Will continue to monitor otherwise.  Follow-up with me again in 6 to 8 weeks.    Nonallopathic problems  Decision today to treat with OMT was based on Physical Exam  After verbal consent patient was treated with HVLA, ME, FPR techniques in cervical, rib, thoracic, lumbar areas  Patient tolerated the procedure well with improvement in symptoms  Patient given exercises, stretches and lifestyle modifications  See medications in patient instructions if given  Patient will follow up in 4-8 weeks     The above documentation has been reviewed and is accurate and complete Lyndal Pulley, DO         Note: This dictation was prepared with Dragon dictation along with smaller phrase technology. Any transcriptional errors that result from this process are unintentional.

## 2022-02-27 ENCOUNTER — Encounter: Payer: Self-pay | Admitting: Family Medicine

## 2022-02-27 ENCOUNTER — Ambulatory Visit: Payer: Medicare Other | Admitting: Family Medicine

## 2022-02-27 VITALS — BP 118/82 | HR 61 | Ht 66.5 in | Wt 199.0 lb

## 2022-02-27 DIAGNOSIS — M9901 Segmental and somatic dysfunction of cervical region: Secondary | ICD-10-CM

## 2022-02-27 DIAGNOSIS — M9902 Segmental and somatic dysfunction of thoracic region: Secondary | ICD-10-CM | POA: Diagnosis not present

## 2022-02-27 DIAGNOSIS — M9908 Segmental and somatic dysfunction of rib cage: Secondary | ICD-10-CM

## 2022-02-27 DIAGNOSIS — M546 Pain in thoracic spine: Secondary | ICD-10-CM

## 2022-02-27 NOTE — Assessment & Plan Note (Signed)
Discussed with patient at great length.  Discussed icing regimen and home exercises.  Discussed which activities to do and which ones to avoid.  Still having some difficulty with rotation of the back.  Will continue to monitor otherwise.  Follow-up with me again in 6 to 8 weeks.

## 2022-02-27 NOTE — Patient Instructions (Signed)
Good to see you Keep working on rotation See me in 6 weeks

## 2022-02-28 ENCOUNTER — Ambulatory Visit: Payer: Medicare Other | Admitting: Family Medicine

## 2022-02-28 DIAGNOSIS — C44319 Basal cell carcinoma of skin of other parts of face: Secondary | ICD-10-CM | POA: Diagnosis not present

## 2022-02-28 DIAGNOSIS — L814 Other melanin hyperpigmentation: Secondary | ICD-10-CM | POA: Diagnosis not present

## 2022-02-28 DIAGNOSIS — L988 Other specified disorders of the skin and subcutaneous tissue: Secondary | ICD-10-CM | POA: Diagnosis not present

## 2022-04-06 NOTE — Progress Notes (Deleted)
  [place] Phone: [phone] Subjective:    I'm seeing this patient by the request  of:  [name], MD  CC:   [id number]  Brendan Bell is a 72 y.o. male coming in with complaint of back and neck pain. OMT 02/27/2022. Patient states   Medications patient has been prescribed: zanaflex  Taking:         Reviewed prior external information including notes and imaging from previsou exam, outside providers and external EMR if available.   As well as notes that were available from care everywhere and other healthcare systems.  Past medical history, social, surgical and family history all reviewed in electronic medical record.  No pertanent information unless stated regarding to the chief complaint.   Past Medical History:  Diagnosis Date   Anxiety    COVID-19    Depression    Headache    silent migraines   Hyperlipidemia    Osteoarthritis    Pneumonia 10/04/1998   out patient   Rhinitis, allergic    Sleep apnea    being fitted for a CPAP 05/17/21    No Known Allergies   Review of Systems:  No headache, visual changes, nausea, vomiting, diarrhea, constipation, dizziness, abdominal pain, skin rash, fevers, chills, night sweats, weight loss, swollen lymph nodes, body aches, joint swelling, chest pain, shortness of breath, mood changes. POSITIVE muscle aches  Objective  There were no vitals taken for this visit.   General: No apparent distress alert and oriented x3 mood and affect normal, dressed appropriately.  HEENT: Pupils equal, extraocular movements intact  Respiratory: Patient's speak in full sentences and does not appear short of breath  Cardiovascular: No lower extremity edema, non tender, no erythema  Gait MSK:  Back   Osteopathic findings  C2 flexed rotated and side bent right C6 flexed rotated and side bent left T3 extended rotated and side bent right inhaled rib T9  extended rotated and side bent left L2 flexed rotated and side bent right Sacrum right on right       Assessment and Plan:  No problem-specific Assessment & Plan notes found for this encounter.    Nonallopathic problems  Decision today to treat with OMT was based on Physical Exam  After verbal consent patient was treated with HVLA, ME, FPR techniques in cervical, rib, thoracic, lumbar, and sacral  areas  Patient tolerated the procedure well with improvement in symptoms  Patient given exercises, stretches and lifestyle modifications  See medications in patient instructions if given  Patient will follow up in 4-8 weeks             Note: This dictation was prepared with Dragon dictation along with smaller phrase technology. Any transcriptional errors that result from this process are unintentional.

## 2022-04-10 ENCOUNTER — Ambulatory Visit: Payer: Medicare Other | Admitting: Family Medicine

## 2022-05-17 ENCOUNTER — Other Ambulatory Visit: Payer: Self-pay | Admitting: Internal Medicine

## 2022-07-02 DIAGNOSIS — G4733 Obstructive sleep apnea (adult) (pediatric): Secondary | ICD-10-CM | POA: Diagnosis not present

## 2022-07-04 ENCOUNTER — Encounter: Payer: Medicare Other | Admitting: Internal Medicine

## 2022-07-11 ENCOUNTER — Encounter: Payer: Self-pay | Admitting: Internal Medicine

## 2022-07-11 ENCOUNTER — Ambulatory Visit (INDEPENDENT_AMBULATORY_CARE_PROVIDER_SITE_OTHER): Payer: Medicare Other | Admitting: Internal Medicine

## 2022-07-11 VITALS — BP 104/76 | HR 72 | Temp 97.2°F | Ht 66.0 in | Wt 198.0 lb

## 2022-07-11 DIAGNOSIS — Z125 Encounter for screening for malignant neoplasm of prostate: Secondary | ICD-10-CM | POA: Diagnosis not present

## 2022-07-11 DIAGNOSIS — G4733 Obstructive sleep apnea (adult) (pediatric): Secondary | ICD-10-CM

## 2022-07-11 DIAGNOSIS — Z0000 Encounter for general adult medical examination without abnormal findings: Secondary | ICD-10-CM | POA: Diagnosis not present

## 2022-07-11 DIAGNOSIS — M159 Polyosteoarthritis, unspecified: Secondary | ICD-10-CM | POA: Diagnosis not present

## 2022-07-11 DIAGNOSIS — Z8673 Personal history of transient ischemic attack (TIA), and cerebral infarction without residual deficits: Secondary | ICD-10-CM

## 2022-07-11 DIAGNOSIS — F3342 Major depressive disorder, recurrent, in full remission: Secondary | ICD-10-CM

## 2022-07-11 LAB — COMPREHENSIVE METABOLIC PANEL
ALT: 24 U/L (ref 0–53)
AST: 23 U/L (ref 0–37)
Albumin: 4.2 g/dL (ref 3.5–5.2)
Alkaline Phosphatase: 66 U/L (ref 39–117)
BUN: 21 mg/dL (ref 6–23)
CO2: 26 mEq/L (ref 19–32)
Calcium: 9.3 mg/dL (ref 8.4–10.5)
Chloride: 105 mEq/L (ref 96–112)
Creatinine, Ser: 1.02 mg/dL (ref 0.40–1.50)
GFR: 74.84 mL/min (ref >60.00)
Glucose, Bld: 83 mg/dL (ref 70–99)
Potassium: 4.5 mEq/L (ref 3.5–5.1)
Sodium: 139 mEq/L (ref 135–145)
Total Bilirubin: 0.7 mg/dL (ref 0.2–1.2)
Total Protein: 7 g/dL (ref 6.0–8.3)

## 2022-07-11 LAB — LIPID PANEL
Cholesterol: 206 mg/dL — ABNORMAL HIGH (ref 0–200)
HDL: 51.1 mg/dL (ref >39.00)
LDL Cholesterol: 135 mg/dL — ABNORMAL HIGH (ref 0–99)
NonHDL: 154.64
Total CHOL/HDL Ratio: 4
Triglycerides: 96 mg/dL (ref 0.0–149.0)
VLDL: 19.2 mg/dL (ref 0.0–40.0)

## 2022-07-11 LAB — CBC
HCT: 48.4 % (ref 39.0–52.0)
Hemoglobin: 16 g/dL (ref 13.0–17.0)
MCHC: 33.1 g/dL (ref 30.0–36.0)
MCV: 90.2 fl (ref 78.0–100.0)
Platelets: 174 10*3/uL (ref 150.0–400.0)
RBC: 5.37 Mil/uL (ref 4.22–5.81)
RDW: 13.8 % (ref 11.5–15.5)
WBC: 6.3 10*3/uL (ref 4.0–10.5)

## 2022-07-11 LAB — PSA, MEDICARE: PSA: 1.62 ng/ml (ref 0.10–4.00)

## 2022-07-11 NOTE — Assessment & Plan Note (Signed)
He prefers no statin Continues on fish oil and ASA 81mg  daily

## 2022-07-11 NOTE — Progress Notes (Signed)
Vision Screening   Right eye Left eye Both eyes  Without correction  20/20 20/20  With correction 20/25    Hearing Screening - Comments:: Passed whisper test

## 2022-07-11 NOTE — Assessment & Plan Note (Signed)
Will continue the citalopram 40mg  and abilify 2mg  daily --probably indefinitely (due to past relapses)

## 2022-07-11 NOTE — Assessment & Plan Note (Signed)
Does well with nightly CPAP 

## 2022-07-11 NOTE — Assessment & Plan Note (Signed)
Mild symptoms Uses tylenol before golf

## 2022-07-11 NOTE — Progress Notes (Signed)
Subjective:    Patient ID: Brendan Bell, male    DOB: 05-Mar-1950, 72 y.o.   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 Files down---no sig pain  Current Outpatient Medications on File Prior to Visit  Medication Sig Dispense Refill   acetaminophen (TYLENOL) 650 MG CR tablet Take 650 mg by mouth every 8 (eight) hours as needed for pain.     ARIPiprazole (ABILIFY) 2 MG tablet TAKE ONE TABLET BY MOUTH AT BEDTIME. 90 tablet 0   aspirin EC 81 MG tablet Take 81 mg by mouth daily. Swallow whole.     Cholecalciferol (VITAMIN D3) 50 MCG (2000 UT) capsule Take 2,000 Units by mouth 2 (two) times a week.     citalopram (CELEXA) 40 MG tablet Take 1 tablet (40 mg total) by mouth daily. 90 tablet 3   MAGNESIUM GLUCONATE PO Take 225 mg by mouth 2 (two) times a week.     Multiple Vitamins-Minerals (MULTIVITAMIN PO) Take 1 tablet by mouth daily.     Omega-3 Fatty Acids (OMEGA 3 PO) Take 1 capsule by mouth 2 (two) times a week.     No current facility-administered medications on file prior to visit.    No Known Allergies  Past Medical History:  Diagnosis Date   Anxiety    COVID-19    Depression    Headache    silent migraines   Hyperlipidemia     Osteoarthritis    Pneumonia 10/04/1998   out patient   Rhinitis, allergic    Sleep apnea    being fitted for a CPAP 05/17/21    Past Surgical History:  Procedure Laterality Date   CATARACT EXTRACTION W/PHACO Right 05/23/2021   Procedure: CATARACT EXTRACTION PHACO AND INTRAOCULAR LENS PLACEMENT (IOC) RIGHT vivity lens 4.61 00:33.1;  Surgeon: Galen Manila, MD;  Location: [facility];  Service: Ophthalmology;  Laterality: Right;   COLONOSCOPY     EYE SURGERY Bilateral    IR SINUS/FIST TUBE CHK-NON GI  04/18/2020   JOINT REPLACEMENT  2/13///9/13   medial right knee///medial left knee---Dr Gavin Potters   KNEE ARTHROSCOPY W/ PARTIAL MEDIAL MENISCECTOMY Right 2008   with pateller chondroplasty ([facility])   KNEE SURGERY  1992   right, loose body removed   KNEE SURGERY     multiple surgeries to both knees   MEDIAL PARTIAL KNEE REPLACEMENT Left    Dr. Gavin Potters   MEDIAL PARTIAL KNEE REPLACEMENT Right    Dr. Gavin Potters   TONSILLECTOMY     TOTAL HIP ARTHROPLASTY Right 01/08/2018   Procedure: RIGHT TOTAL HIP ARTHROPLASTY ANTERIOR APPROACH;  Surgeon: Ollen Gross, MD;  Location: [facility];  Service: Orthopedics;  Laterality: Right;    TOTAL KNEE ARTHROPLASTY WITH REVISION COMPONENTS Left 02/21/2015   Procedure: TOTAL KNEE ARTHROPLASTY WITH REVISION COMPONENTS;  Surgeon: Gean Birchwood, MD;  Location: [facility];  Service:  Orthopedics;  Laterality: Left;   XI ROBOTIC LAPAROSCOPIC ASSISTED APPENDECTOMY N/A 07/27/2020   Procedure: XI ROBOTIC LAPAROSCOPIC ASSISTED APPENDECTOMY;  Surgeon: Carolan Shiver, MD;  Location: [facility];  Service: General;  Laterality: N/A;    Family History  Problem Relation Age of Onset   Heart disease Mother        CAD, MI   Hypertension Mother    Stroke Father    Aneurysm Father        died from rupture of AAA   Cancer Paternal Grandfather        prostate    Social History   Socioeconomic History   Marital status: Married    Spouse name: Not on  file   Number of children: 5   Years of education: Not on file   Highest education level: Not on file  Occupational History   Occupation: Transport planner    Comment: [employer]--retired   Occupation: Landlord  Tobacco Use   Smoking status: Never    Passive exposure: Past   Smokeless tobacco: Never  Vaping Use   Vaping Use: Never used  Substance and Sexual Activity   Alcohol use: Yes    Alcohol/week: 14.0 standard drinks of alcohol    Types: 14 Cans of beer per week    Comment: 2 beers a day in general.   Drug use: No   Sexual activity: Not on file  Other Topics Concern   Not on file  Social History Narrative   2nd marriage.  3 children from first marriage, 2 stepchildren, 2 with current marriage.      Has living will   Wife is health care POA--daughters Erin/Francie  ould be alternate   Would accept resuscitation   Would accept tube feeds briefly but not if cognitively unaware   Social Determinants of Health   Financial Resource Strain: Not on file  Food Insecurity: Not on file  Transportation Needs: Not on file  Physical Activity: Not on file  Stress: Not on file  Social Connections: Not on file  Intimate Partner Violence: Not on file   Review of Systems Appetite is fine Weight is up a few pounds Wears seat belt Teeth are fine--keeps up with dentist Has aggravating spot on back of neck No heartburn or dysphagia Bowels move fine--no blood No sig back or joint pains---just mild aches and pains No chest pain or SOB No dizziness or syncope No palpitations    Objective:   Physical Exam Constitutional:      Appearance: Normal appearance.  HENT:     Mouth/Throat:     Pharynx: No oropharyngeal exudate or posterior oropharyngeal erythema.  Eyes:     Conjunctiva/sclera: Conjunctivae normal.     Pupils: Pupils are equal, round, and reactive to light.  Cardiovascular:     Rate and Rhythm: Normal rate and regular rhythm.     Pulses: Normal pulses.     Heart  sounds: No murmur heard.    No gallop.  Pulmonary:     Effort: Pulmonary effort is normal.     Breath sounds: Normal breath sounds. No wheezing or rales.  Abdominal:     Palpations: Abdomen is soft.     Tenderness: There is no abdominal tenderness.  Musculoskeletal:     Cervical back: Neck supple.     Right lower leg: No edema.     Left lower leg: No edema.  Lymphadenopathy:     Cervical: No cervical adenopathy.  Skin:    Findings: No  rash.     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     Mental Status: He is alert and oriented to person, place, and time.     Comments: Word naming 6 then froze Recall 2/3  Psychiatric:        Mood and Affect: Mood normal.        Behavior: Behavior normal.            Assessment & Plan:

## 2022-07-11 NOTE — Assessment & Plan Note (Signed)
I have personally reviewed the Medicare Annual Wellness questionnaire and have noted 1. The patient's medical and social history 2. Their use of alcohol, tobacco or illicit drugs 3. Their current medications and supplements 4. The patient's functional ability including ADL's, fall risks, home safety risks and hearing or visual             impairment. 5. Diet and physical activities 6. Evidence for depression or mood disorders  The patients weight, height, BMI and visual acuity have been recorded in the chart I have made referrals, counseling and provided education to the patient based review of the above and I have provided the pt with a written personalized care plan for preventive services.  I have provided you with a copy of your personalized plan for preventive services. Please take the time to review along with your updated medication list.  Discussed fitness Last screening colon 2031 (consider) Will check PSA Prefers no COVID or flu vaccines Will consider shingrix at pharmacy

## 2022-08-27 ENCOUNTER — Other Ambulatory Visit: Payer: Self-pay | Admitting: Internal Medicine

## 2022-09-05 ENCOUNTER — Ambulatory Visit: Payer: Medicare Other | Admitting: Primary Care

## 2022-09-05 ENCOUNTER — Encounter: Payer: Self-pay | Admitting: Primary Care

## 2022-09-05 VITALS — BP 120/70 | HR 62 | Temp 98.4°F | Ht 66.0 in | Wt 204.0 lb

## 2022-09-05 DIAGNOSIS — G4733 Obstructive sleep apnea (adult) (pediatric): Secondary | ICD-10-CM

## 2022-09-05 NOTE — Patient Instructions (Signed)
-   Sleep apnea is well-controlled on current pressure settings - Continue to wear CPAP nightly 4 to 6 hours or longer - Let us know if you are interested in proceeding with inspire workup/approval.  You would initially need a sleep endoscopy with one of our pulmonologist and then you would be referred to ear nose and throat for hypoglossal nerve stimulator implantation (Qualifications - moderate to severe sleep apnea, BMI less than 35, CPAP intolerance)   Follow-up: - 1 year with [facility] NP or sooner if needed

## 2022-09-05 NOTE — Progress Notes (Signed)
@Patient  ID: Brendan Bell, male    DOB: February 16, 1950, 72 y.o.   MRN: [id number]  Chief Complaint  Patient presents with   Follow-up    Nasal congestion causing problems using CPAP.  Discuss Inspire.    Referring provider: Karie Schwalbe, MD  HPI: 72 year old male, never smoked.  Past medical history significant for obstructive sleep apnea, allergic rhinitis, TIA, hyperlipidemia, depression.  Previous [facility] pulmonary encounter:  07/28/2021 Patient presents today for 2 to 23-month follow-up/OSA. Patient had sleep study on April 19, 2021 that showed evidence of severe obstructive sleep apnea, AHI 31/hour with SPO2 low 83%.  He was started on CPAP back in April 2023.  Patient has been compliant with CPAP use.  Current pressure settings auto titrate 5 to 15 cm H2O without significant residual apneas.  He is doing very well. He is compliance with CPAP use. No issues with pressure setting or mask fit. Daytime sleeping has significantly improved since starting on CPAP therapy. He rarely gets tired during the day. Sleeping well at night.   Airview download 06/27/2021 - 07/26/2021 28/30 days (93%) > 4 hours Average usage 8 hours 18 minutes Pressure 5 to 15 cm H2O (11.4cm h20-95%) Air leaks 0.5L/min AHI 1.8   09/05/2022- Interim hx  Patient presents today for 1 year follow-up of OSA. Overall he is doing well. He is compliant with CPAP use. He has difficulty tolerating CPAP at times due to chronic nasal congestion. He struggles with tubing when he sleeps at night as well. He finds it inconvenient to travel with his CPAP. He is interested in learning more about [facility], we discuss indications of use and qualifications.   Airview download 08/05/22-09/03/22 Usage 30/30 days (100%); 29 days (97%) greater than 4 hours Average usage 7 hours 39 minutes Pressure 5 to 15 cm H2O (10.4cm h20-95%) Air leaks 0.1 L/min (95%) AHI 1.2  No Known Allergies  Immunization History  Administered Date(s)  Administered   Fluad Quad(high Dose 65+) 11/18/2019   Hepatitis A 01/18/2016, 03/02/2016   Hepatitis B 01/18/2016, 03/02/2016   Moderna Sars-Covid-2 Vaccination 03/17/2019, 04/14/2019, 11/25/2019   Pneumococcal Conjugate-13 04/29/2019   Pneumococcal Polysaccharide-23 05/03/2020   Td 10/24/2007, 03/21/2017    Past Medical History:  Diagnosis Date   Anxiety    COVID-19    Depression    Headache    silent migraines   Hyperlipidemia    Osteoarthritis    Pneumonia 10/04/1998   out patient   Rhinitis, allergic    Sleep apnea    being fitted for a CPAP 05/17/21    Tobacco History: Social History   Tobacco Use  Smoking Status Never   Passive exposure: Past  Smokeless Tobacco Never   Counseling given: Not Answered   Outpatient Medications Prior to Visit  Medication Sig Dispense Refill   acetaminophen (TYLENOL) 650 MG CR tablet Take 650 mg by mouth every 8 (eight) hours as needed for pain.     ARIPiprazole (ABILIFY) 2 MG tablet TAKE ONE TABLET BY MOUTH ONCE DAILY 90 tablet 3   aspirin EC 81 MG tablet Take 81 mg by mouth daily. Swallow whole.     Cholecalciferol (VITAMIN D3) 50 MCG (2000 UT) capsule Take 2,000 Units by mouth 2 (two) times a week.     citalopram (CELEXA) 40 MG tablet Take 1 tablet (40 mg total) by mouth daily. 90 tablet 3   MAGNESIUM GLUCONATE PO Take 225 mg by mouth 2 (two) times a week.     Multiple Vitamins-Minerals (  PRESERVISION AREDS PO) Take 1 tablet by mouth daily.     Omega-3 Fatty Acids (OMEGA 3 PO) Take 1 capsule by mouth 2 (two) times a week.     Multiple Vitamins-Minerals (MULTIVITAMIN PO) Take 1 tablet by mouth daily. (Patient not taking: Reported on 09/05/2022)     No facility-administered medications prior to visit.   Review of Systems  Review of Systems  Constitutional: Negative.   HENT:  Positive for congestion and postnasal drip.   Respiratory: Negative.    Cardiovascular: Negative.    Physical Exam  BP 120/70 (BP Location: Left Arm,  Patient Position: Sitting, Cuff Size: Large)   Pulse 62   Temp 98.4 F (36.9 C) (Oral)   Ht 5\' 6"  (1.676 m)   Wt 204 lb (92.5 kg)   SpO2 99%   BMI 32.93 kg/m  Physical Exam Constitutional:      General: He is not in acute distress.    Appearance: Normal appearance. He is not ill-appearing.  HENT:     Head: Normocephalic and atraumatic.     Mouth/Throat:     Mouth: Mucous membranes are moist.     Pharynx: Oropharynx is clear.  Cardiovascular:     Rate and Rhythm: Normal rate and regular rhythm.  Pulmonary:     Effort: Pulmonary effort is normal.     Breath sounds: Normal breath sounds. No wheezing, rhonchi or rales.  Musculoskeletal:        General: Normal range of motion.  Skin:    General: Skin is warm and dry.  Neurological:     General: No focal deficit present.     Mental Status: He is alert and oriented to person, place, and time. Mental status is at baseline.  Psychiatric:        Mood and Affect: Mood normal.        Behavior: Behavior normal.        Thought Content: Thought content normal.        Judgment: Judgment normal.      Lab Results:  CBC    Component Value Date/Time   WBC 6.3 07/11/2022 1023   RBC 5.37 07/11/2022 1023   HGB 16.0 07/11/2022 1023   HCT 48.4 07/11/2022 1023   PLT 174.0 07/11/2022 1023   MCV 90.2 07/11/2022 1023   MCH 29.3 07/25/2020 0848   MCHC 33.1 07/11/2022 1023   RDW 13.8 07/11/2022 1023   LYMPHSABS 2.7 02/10/2015 0941   MONOABS 0.6 02/10/2015 0941   EOSABS 0.1 02/10/2015 0941   BASOSABS 0.0 02/10/2015 0941    BMET    Component Value Date/Time   NA 139 07/11/2022 1023   K 4.5 07/11/2022 1023   CL 105 07/11/2022 1023   CO2 26 07/11/2022 1023   GLUCOSE 83 07/11/2022 1023   BUN 21 07/11/2022 1023   CREATININE 1.02 07/11/2022 1023   CALCIUM 9.3 07/11/2022 1023   GFRNONAA >60 04/11/2020 0525   GFRAA >60 01/09/2018 0411    BNP No results found for: "BNP"  ProBNP No results found for: "PROBNP"  Imaging: No  results found.   Assessment & Plan:   Obstructive sleep apnea - Sleep study on April 19, 2021 showed evidence of severe obstructive sleep apnea, AHI 31/hour with SPO2 low 83%. He is 97% compliant with CPAP use > 4 hours last 30 days. Current pressure 5-15cm h20; Residual AHI 1.8/hour. Despite compliance with CPAP he does find it difficult to tolerate CPAP at times due to chronic sinus congestion and often  struggles with the tubing. He travels frequently and bringing CPAP can be inconvenient. He is interested in learning more about Inspire device, we reviewed indications for usage and qualifications. He was given educational pack today. He will review information and get back to [place] if he would like to pursue next steps of sleep endoscopy.  Advised patient continue her CPAP nightly for 4 to 6 hours or longer.  Encouraged weight loss efforts and side sleeping position.  Follow-up 1 year or sooner if needed.  Glenford Bayley, NP 09/05/2022

## 2022-09-05 NOTE — Assessment & Plan Note (Addendum)
-   Sleep study on April 19, 2021 showed evidence of severe obstructive sleep apnea, AHI 31/hour with SPO2 low 83%. He is 97% compliant with CPAP use > 4 hours last 30 days. Current pressure 5-15cm h20; Residual AHI 1.8/hour. Despite compliance with CPAP he does find it difficult to tolerate CPAP at times due to chronic sinus congestion and often struggles with the tubing. He travels frequently and bringing CPAP can be inconvenient. He is interested in learning more about Inspire device, we reviewed indications for usage and qualifications. He was given educational pack today. He will review information and get back to [place] if he would like to pursue next steps of sleep endoscopy.  Advised patient continue her CPAP nightly for 4 to 6 hours or longer.  Encouraged weight loss efforts and side sleeping position.  Follow-up 1 year or sooner if needed.

## 2022-09-07 NOTE — Progress Notes (Signed)
Reviewed and agree with assessment/plan.   Coralyn Helling, MD [facility] Pulmonary/Critical Care 09/07/2022, 3:48 PM Pager:  [phone]

## 2022-10-25 ENCOUNTER — Other Ambulatory Visit: Payer: Self-pay | Admitting: Internal Medicine

## 2022-11-21 ENCOUNTER — Ambulatory Visit: Payer: Medicare Other | Admitting: Internal Medicine

## 2022-11-21 ENCOUNTER — Encounter: Payer: Self-pay | Admitting: Internal Medicine

## 2022-11-21 VITALS — BP 122/84 | HR 65 | Temp 97.8°F | Ht 66.0 in | Wt 203.0 lb

## 2022-11-21 DIAGNOSIS — G252 Other specified forms of tremor: Secondary | ICD-10-CM | POA: Insufficient documentation

## 2022-11-21 LAB — COMPREHENSIVE METABOLIC PANEL
ALT: 28 U/L (ref 0–53)
AST: 28 U/L (ref 0–37)
Albumin: 4.3 g/dL (ref 3.5–5.2)
Alkaline Phosphatase: 66 U/L (ref 39–117)
BUN: 23 mg/dL (ref 6–23)
CO2: 29 meq/L (ref 19–32)
Calcium: 9.4 mg/dL (ref 8.4–10.5)
Chloride: 105 meq/L (ref 96–112)
Creatinine, Ser: 1.12 mg/dL (ref 0.40–1.50)
GFR: 66.72 mL/min (ref >60.00)
Glucose, Bld: 87 mg/dL (ref 70–99)
Potassium: 4.7 meq/L (ref 3.5–5.1)
Sodium: 140 meq/L (ref 135–145)
Total Bilirubin: 0.5 mg/dL (ref 0.2–1.2)
Total Protein: 7 g/dL (ref 6.0–8.3)

## 2022-11-21 LAB — CBC
HCT: 50.6 % (ref 39.0–52.0)
Hemoglobin: 16.5 g/dL (ref 13.0–17.0)
MCHC: 32.6 g/dL (ref 30.0–36.0)
MCV: 91.2 fL (ref 78.0–100.0)
Platelets: 179 10*3/uL (ref 150.0–400.0)
RBC: 5.56 Mil/uL (ref 4.22–5.81)
RDW: 14.1 % (ref 11.5–15.5)
WBC: 6.3 10*3/uL (ref 4.0–10.5)

## 2022-11-21 LAB — TSH: TSH: 1.32 u[IU]/mL (ref 0.35–5.50)

## 2022-11-21 NOTE — Assessment & Plan Note (Signed)
Hand hand only Certainly could be the very first sign of Parkinson's but very early No other worrisome neurologic symptoms Will check labs If progresses--will set up evaluation with Dr Tat

## 2022-11-21 NOTE — Progress Notes (Signed)
Subjective:    Patient ID: Brendan Bell, male    DOB: 1950/12/28, 72 y.o.   MRN: [id number]  HPI Here due to tremor  Has noticed a tremor in right hand First noticed when holding the steering wheel Can feel muscles "flexing" Holding putter down--feels the tremor Doesn't notice with movement  No sig stiffness Trying to increase stride---notices it has become shorter  No other neuro symptoms--abnormal weakness, sensory change, aphasia, droop, etc No change in handwriting  Remembers dad having head shake when older  Current Outpatient Medications on File Prior to Visit  Medication Sig Dispense Refill   acetaminophen (TYLENOL) 650 MG CR tablet Take 650 mg by mouth every 8 (eight) hours as needed for pain.     ARIPiprazole (ABILIFY) 2 MG tablet TAKE ONE TABLET BY MOUTH ONCE DAILY 90 tablet 3   aspirin EC 81 MG tablet Take 81 mg by mouth daily. Swallow whole.     Cholecalciferol (VITAMIN D3) 50 MCG (2000 UT) capsule Take 2,000 Units by mouth 2 (two) times a week.     citalopram (CELEXA) 40 MG tablet TAKE ONE TABLET BY MOUTH EVERY DAY 90 tablet 3   MAGNESIUM GLUCONATE PO Take 225 mg by mouth 2 (two) times a week.     Multiple Vitamins-Minerals (MULTIVITAMIN PO) Take 1 tablet by mouth daily.     Multiple Vitamins-Minerals (PRESERVISION AREDS PO) Take 1 tablet by mouth daily.     Omega-3 Fatty Acids (OMEGA 3 PO) Take 1 capsule by mouth 2 (two) times a week.     No current facility-administered medications on file prior to visit.    No Known Allergies  Past Medical History:  Diagnosis Date   Anxiety    COVID-19    Depression    Headache    silent migraines   Hyperlipidemia    Osteoarthritis    Pneumonia 10/04/1998   out patient   Rhinitis, allergic    Sleep apnea    being fitted for a CPAP 05/17/21    Past Surgical History:  Procedure Laterality Date   CATARACT EXTRACTION W/PHACO Right 05/23/2021   Procedure: CATARACT EXTRACTION PHACO AND INTRAOCULAR LENS PLACEMENT  (IOC) RIGHT vivity lens 4.61 00:33.1;  Surgeon: Galen Manila, MD;  Location: [facility];  Service: Ophthalmology;  Laterality: Right;   COLONOSCOPY     EYE SURGERY Bilateral    IR SINUS/FIST TUBE CHK-NON GI  04/18/2020   JOINT REPLACEMENT  2/13///9/13   medial right knee///medial left knee---Dr Gavin Potters   KNEE ARTHROSCOPY W/ PARTIAL MEDIAL MENISCECTOMY Right 2008   with pateller chondroplasty ([facility])   KNEE SURGERY  1992   right, loose body removed   KNEE SURGERY     multiple surgeries to both knees   MEDIAL PARTIAL KNEE REPLACEMENT Left    Dr. Gavin Potters   MEDIAL PARTIAL KNEE REPLACEMENT Right    Dr. Gavin Potters   TONSILLECTOMY     TOTAL HIP ARTHROPLASTY Right 01/08/2018   Procedure: RIGHT TOTAL HIP ARTHROPLASTY ANTERIOR APPROACH;  Surgeon: Ollen Gross, MD;  Location: [facility];  Service: Orthopedics;  Laterality: Right;    TOTAL KNEE ARTHROPLASTY WITH REVISION COMPONENTS Left 02/21/2015   Procedure: TOTAL KNEE ARTHROPLASTY WITH REVISION COMPONENTS;  Surgeon: Gean Birchwood, MD;  Location: [facility];  Service: Orthopedics;  Laterality: Left;   XI ROBOTIC LAPAROSCOPIC ASSISTED APPENDECTOMY N/A 07/27/2020   Procedure: XI ROBOTIC LAPAROSCOPIC ASSISTED APPENDECTOMY;  Surgeon: Carolan Shiver, MD;  Location: [facility];  Service: General;  Laterality: N/A;  Family History  Problem Relation Age of Onset   Heart disease Mother        CAD, MI   Hypertension Mother    Stroke Father    Aneurysm Father        died from rupture of AAA   Cancer Paternal Grandfather        prostate    Social History   Socioeconomic History   Marital status: Married    Spouse name: Not on file   Number of children: 5   Years of education: Not on file   Highest education level: Not on file  Occupational History   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   Drug use: No   Sexual activity: Not on file  Other Topics Concern   Not on file  Social History Narrative   2nd marriage.  3 children from first marriage, 2 stepchildren, 2 with current marriage.      Has living will   Wife is health care POA--daughters Erin/Francie  ould be alternate   Would accept resuscitation   Would accept tube feeds briefly but not if cognitively unaware   Social Determinants of Health   Financial Resource Strain: Not on file  Food Insecurity: Not on file  Transportation Needs: Not on file  Physical Activity: Not on file  Stress: Not on file  Social Connections: Not on file  Intimate Partner Violence: Not on file   Review of Systems     Objective:   Physical Exam Constitutional:      Appearance: Normal appearance.  Neurological:     Mental Status: He is alert.     Comments: Normal gait Normal handwriting No bradykinesia Normal tone No clear tremor now            Assessment & Plan:

## 2023-01-21 DIAGNOSIS — H2512 Age-related nuclear cataract, left eye: Secondary | ICD-10-CM | POA: Diagnosis not present

## 2023-01-21 DIAGNOSIS — H43813 Vitreous degeneration, bilateral: Secondary | ICD-10-CM | POA: Diagnosis not present

## 2023-01-21 DIAGNOSIS — Z961 Presence of intraocular lens: Secondary | ICD-10-CM | POA: Diagnosis not present

## 2023-01-21 DIAGNOSIS — H353131 Nonexudative age-related macular degeneration, bilateral, early dry stage: Secondary | ICD-10-CM | POA: Diagnosis not present

## 2023-02-06 DIAGNOSIS — L570 Actinic keratosis: Secondary | ICD-10-CM | POA: Diagnosis not present

## 2023-02-06 DIAGNOSIS — Z08 Encounter for follow-up examination after completed treatment for malignant neoplasm: Secondary | ICD-10-CM | POA: Diagnosis not present

## 2023-02-06 DIAGNOSIS — L578 Other skin changes due to chronic exposure to nonionizing radiation: Secondary | ICD-10-CM | POA: Diagnosis not present

## 2023-02-06 DIAGNOSIS — Z85828 Personal history of other malignant neoplasm of skin: Secondary | ICD-10-CM | POA: Diagnosis not present

## 2023-06-05 ENCOUNTER — Encounter: Payer: Self-pay | Admitting: Internal Medicine

## 2023-06-05 ENCOUNTER — Ambulatory Visit (INDEPENDENT_AMBULATORY_CARE_PROVIDER_SITE_OTHER): Admitting: Internal Medicine

## 2023-06-05 VITALS — BP 122/86 | HR 70 | Temp 97.7°F | Ht 66.0 in | Wt 206.0 lb

## 2023-06-05 DIAGNOSIS — K30 Functional dyspepsia: Secondary | ICD-10-CM | POA: Insufficient documentation

## 2023-06-05 NOTE — Progress Notes (Signed)
 Subjective:    Patient ID: CRAY PREW, male    DOB: Jun 23, 1950, 73 y.o.   MRN: [id number]  HPI Here due to abdominal discomfort  First thing in the morning--stomach will get upset---once had probiotic (Kombucha), once took vitamins on empty stomach and another time he had energy bar These episodes occurred over 3 month time Got stomach upset ---no vomiting  Diarrhea later in the day once only Back to normal by later in the day  Has felt off at times Stress with children Some issues with depression--"like I'm in a cloud"   for hours to a day  Generally eats breakfast Not always healthy choices  No swallowing problems or heartburn  Current Outpatient Medications on File Prior to Visit  Medication Sig Dispense Refill   acetaminophen  (TYLENOL ) 650 MG CR tablet Take 650 mg by mouth every 8 (eight) hours as needed for pain.     ARIPiprazole  (ABILIFY ) 2 MG tablet TAKE ONE TABLET BY MOUTH ONCE DAILY 90 tablet 3   aspirin  EC 81 MG tablet Take 81 mg by mouth daily. Swallow whole.     Cholecalciferol (VITAMIN D3) 50 MCG (2000 UT) capsule Take 2,000 Units by mouth 2 (two) times a week.     citalopram  (CELEXA ) 40 MG tablet TAKE ONE TABLET BY MOUTH EVERY DAY 90 tablet 3   MAGNESIUM GLUCONATE PO Take 225 mg by mouth 2 (two) times a week.     Multiple Vitamins-Minerals (MULTIVITAMIN PO) Take 1 tablet by mouth daily.     Multiple Vitamins-Minerals (PRESERVISION AREDS PO) Take 1 tablet by mouth daily.     Omega-3 Fatty Acids (OMEGA 3 PO) Take 1 capsule by mouth 2 (two) times a week.     No current facility-administered medications on file prior to visit.    No Known Allergies  Past Medical History:  Diagnosis Date   Anxiety    COVID-19    Depression    Headache    silent migraines   Hyperlipidemia    Osteoarthritis    Pneumonia 10/04/1998   out patient   Rhinitis, allergic    Sleep apnea    being fitted for a CPAP 05/17/21    Past Surgical History:  Procedure Laterality  Date   CATARACT EXTRACTION W/PHACO Right 05/23/2021   Procedure: CATARACT EXTRACTION PHACO AND INTRAOCULAR LENS PLACEMENT (IOC) RIGHT vivity lens 4.61 00:33.1;  Surgeon: Clair Crews, MD;  Location: [facility];  Service: Ophthalmology;  Laterality: Right;   COLONOSCOPY     EYE SURGERY Bilateral    IR SINUS/FIST TUBE CHK-NON GI  04/18/2020   JOINT REPLACEMENT  2/13///9/13   medial right knee///medial left knee---Dr Ivette Marks   KNEE ARTHROSCOPY W/ PARTIAL MEDIAL MENISCECTOMY Right 2008   with pateller chondroplasty ([facility])   KNEE SURGERY  1992   right, loose body removed   KNEE SURGERY     multiple surgeries to both knees   MEDIAL PARTIAL KNEE REPLACEMENT Left    Dr. Ivette Marks   MEDIAL PARTIAL KNEE REPLACEMENT Right    Dr. Ivette Marks   TONSILLECTOMY     TOTAL HIP ARTHROPLASTY Right 01/08/2018   Procedure: RIGHT TOTAL HIP ARTHROPLASTY ANTERIOR APPROACH;  Surgeon: Liliane Rei, MD;  Location: [facility];  Service: Orthopedics;  Laterality: Right;    TOTAL KNEE ARTHROPLASTY WITH REVISION COMPONENTS Left 02/21/2015   Procedure: TOTAL KNEE ARTHROPLASTY WITH REVISION COMPONENTS;  Surgeon: Wendolyn Hamburger, MD;  Location: [facility];  Service: Orthopedics;  Laterality: Left;   XI ROBOTIC LAPAROSCOPIC  ASSISTED APPENDECTOMY N/A 07/27/2020   Procedure: XI ROBOTIC LAPAROSCOPIC ASSISTED APPENDECTOMY;  Surgeon: Eldred Grego, MD;  Location: [facility];  Service: General;  Laterality: N/A;    Family History  Problem Relation Age of Onset   Heart disease Mother        CAD, MI   Hypertension Mother    Stroke Father    Aneurysm Father        died from rupture of AAA   Cancer Paternal Grandfather        prostate    Social History   Socioeconomic History   Marital status: Married    Spouse name: Not on file   Number of children: 5   Years of education: Not on file   Highest education level: Not on file  Occupational History   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   Drug use: No   Sexual activity: Not on file  Other Topics Concern   Not on file  Social History Narrative   2nd marriage.  3 children from first marriage, 2 stepchildren, 2 with current marriage.      Has living will   Wife is health care POA--daughters Erin/Francie  ould be alternate   Would accept resuscitation   Would accept tube feeds briefly but not if cognitively unaware   Social Drivers of Health   Financial Resource Strain: Not on file  Food Insecurity: Not on file  Transportation Needs: Not on file  Physical Activity: Not on file  Stress: Not on file  Social Connections: Not on file  Intimate Partner Violence: Not on file   Review of Systems Bowels are regular otherwise Appetite is good Weight is stable Is golfing twice a week No N/V    Objective:   Physical Exam Constitutional:      Appearance: He is well-developed.  Cardiovascular:     Rate and Rhythm: Normal rate and regular rhythm.     Heart sounds: No murmur heard.    No gallop.  Pulmonary:     Effort: Pulmonary effort is normal.     Breath sounds: Normal breath sounds. No wheezing or rales.  Abdominal:     General: There is no distension.     Palpations: Abdomen is soft.     Tenderness: There is no abdominal tenderness. There is no guarding or rebound.  Musculoskeletal:     Cervical back: Neck supple.  Lymphadenopathy:     Cervical: No cervical adenopathy.  Neurological:     Mental Status: He is alert.            Assessment & Plan:

## 2023-06-05 NOTE — Assessment & Plan Note (Signed)
 Isolated spells when had things on an empty stomach No other problems Weight up slightly----bowels and appetite good Nothing to suggest ulcer No reflux symptoms  Reassured--nothing worrisome going on

## 2023-06-25 ENCOUNTER — Telehealth: Payer: Self-pay | Admitting: Primary Care

## 2023-06-25 NOTE — Telephone Encounter (Signed)
 Rc'd fax order for CPAP machine and supplies. Will send to Ms. Sueanne Emerald for [employer].

## 2023-06-27 NOTE — Telephone Encounter (Signed)
 Duplicate Rc'd.

## 2023-07-02 NOTE — Telephone Encounter (Signed)
 CMN faxed successfully and signed.

## 2023-07-24 ENCOUNTER — Encounter: Payer: Self-pay | Admitting: Internal Medicine

## 2023-07-24 ENCOUNTER — Ambulatory Visit (INDEPENDENT_AMBULATORY_CARE_PROVIDER_SITE_OTHER): Payer: Medicare Other | Admitting: Internal Medicine

## 2023-07-24 VITALS — BP 98/64 | HR 73 | Temp 97.7°F | Ht 66.25 in | Wt 202.0 lb

## 2023-07-24 DIAGNOSIS — F3342 Major depressive disorder, recurrent, in full remission: Secondary | ICD-10-CM

## 2023-07-24 DIAGNOSIS — E785 Hyperlipidemia, unspecified: Secondary | ICD-10-CM | POA: Diagnosis not present

## 2023-07-24 DIAGNOSIS — G4733 Obstructive sleep apnea (adult) (pediatric): Secondary | ICD-10-CM | POA: Diagnosis not present

## 2023-07-24 DIAGNOSIS — Z1159 Encounter for screening for other viral diseases: Secondary | ICD-10-CM | POA: Diagnosis not present

## 2023-07-24 DIAGNOSIS — Z125 Encounter for screening for malignant neoplasm of prostate: Secondary | ICD-10-CM

## 2023-07-24 DIAGNOSIS — Z0000 Encounter for general adult medical examination without abnormal findings: Secondary | ICD-10-CM

## 2023-07-24 LAB — COMPREHENSIVE METABOLIC PANEL WITH GFR
ALT: 25 U/L (ref 0–53)
AST: 24 U/L (ref 0–37)
Albumin: 4.4 g/dL (ref 3.5–5.2)
Alkaline Phosphatase: 73 U/L (ref 39–117)
BUN: 34 mg/dL — ABNORMAL HIGH (ref 6–23)
CO2: 28 meq/L (ref 19–32)
Calcium: 9.5 mg/dL (ref 8.4–10.5)
Chloride: 103 meq/L (ref 96–112)
Creatinine, Ser: 1.35 mg/dL (ref 0.40–1.50)
GFR: 53.07 mL/min — ABNORMAL LOW (ref >60.00)
Glucose, Bld: 94 mg/dL (ref 70–99)
Potassium: 4.6 meq/L (ref 3.5–5.1)
Sodium: 139 meq/L (ref 135–145)
Total Bilirubin: 0.7 mg/dL (ref 0.2–1.2)
Total Protein: 7.2 g/dL (ref 6.0–8.3)

## 2023-07-24 LAB — LIPID PANEL
Cholesterol: 227 mg/dL — ABNORMAL HIGH (ref 0–200)
HDL: 44.7 mg/dL (ref >39.00)
LDL Cholesterol: 145 mg/dL — ABNORMAL HIGH (ref 0–99)
NonHDL: 182.4
Total CHOL/HDL Ratio: 5
Triglycerides: 185 mg/dL — ABNORMAL HIGH (ref 0.0–149.0)
VLDL: 37 mg/dL (ref 0.0–40.0)

## 2023-07-24 LAB — CBC
HCT: 49.1 % (ref 39.0–52.0)
Hemoglobin: 16.3 g/dL (ref 13.0–17.0)
MCHC: 33.2 g/dL (ref 30.0–36.0)
MCV: 88.4 fl (ref 78.0–100.0)
Platelets: 205 10*3/uL (ref 150.0–400.0)
RBC: 5.55 Mil/uL (ref 4.22–5.81)
RDW: 14.7 % (ref 11.5–15.5)
WBC: 7.7 10*3/uL (ref 4.0–10.5)

## 2023-07-24 LAB — PSA, MEDICARE: PSA: 2.13 ng/mL (ref 0.10–4.00)

## 2023-07-24 MED ORDER — ARIPIPRAZOLE 2 MG PO TABS: 2.0000 mg | ORAL_TABLET | Freq: Every day | ORAL | 3 refills | Status: AC

## 2023-07-24 NOTE — Progress Notes (Signed)
 Subjective:    Patient ID: Brendan Bell, male    DOB: 1950/11/29, 73 y.o.   MRN: [id number]  HPI Here for physical  Doing well No stomach concerns and overall doing well Not much exercise---does golf  Depression has been controlled  Current Outpatient Medications on File Prior to Visit  Medication Sig Dispense Refill   acetaminophen  (TYLENOL ) 650 MG CR tablet Take 650 mg by mouth every 8 (eight) hours as needed for pain.     ARIPiprazole  (ABILIFY ) 2 MG tablet TAKE ONE TABLET BY MOUTH ONCE DAILY 90 tablet 3   aspirin  EC 81 MG tablet Take 81 mg by mouth daily. Swallow whole.     Cholecalciferol (VITAMIN D3) 50 MCG (2000 UT) capsule Take 2,000 Units by mouth 2 (two) times a week.     citalopram  (CELEXA ) 40 MG tablet TAKE ONE TABLET BY MOUTH EVERY DAY 90 tablet 3   MAGNESIUM GLUCONATE PO Take 225 mg by mouth 2 (two) times a week.     Multiple Vitamins-Minerals (MULTIVITAMIN PO) Take 1 tablet by mouth daily.     Multiple Vitamins-Minerals (PRESERVISION AREDS PO) Take 1 tablet by mouth daily.     Omega-3 Fatty Acids (OMEGA 3 PO) Take 1 capsule by mouth 2 (two) times a week.     No current facility-administered medications on file prior to visit.    No Known Allergies  Past Medical History:  Diagnosis Date   Anxiety    COVID-19    Depression    Headache    silent migraines   Hyperlipidemia    Osteoarthritis    Pneumonia 10/04/1998   out patient   Rhinitis, allergic    Sleep apnea    being fitted for a CPAP 05/17/21    Past Surgical History:  Procedure Laterality Date   CATARACT EXTRACTION W/PHACO Right 05/23/2021   Procedure: CATARACT EXTRACTION PHACO AND INTRAOCULAR LENS PLACEMENT (IOC) RIGHT vivity lens 4.61 00:33.1;  Surgeon: Jaye Fallow, MD;  Location: [facility];  Service: Ophthalmology;  Laterality: Right;   COLONOSCOPY     EYE SURGERY Bilateral    IR SINUS/FIST TUBE CHK-NON GI  04/18/2020   JOINT REPLACEMENT  2/13///9/13   medial right  knee///medial left knee---Dr Maryl   KNEE ARTHROSCOPY W/ PARTIAL MEDIAL MENISCECTOMY Right 2008   with pateller chondroplasty ([facility])   KNEE SURGERY  1992   right, loose body removed   KNEE SURGERY     multiple surgeries to both knees   MEDIAL PARTIAL KNEE REPLACEMENT Left    Dr. Maryl   MEDIAL PARTIAL KNEE REPLACEMENT Right    Dr. Maryl   TONSILLECTOMY     TOTAL HIP ARTHROPLASTY Right 01/08/2018   Procedure: RIGHT TOTAL HIP ARTHROPLASTY ANTERIOR APPROACH;  Surgeon: Melodi Lerner, MD;  Location: [facility];  Service: Orthopedics;  Laterality: Right;    TOTAL KNEE ARTHROPLASTY WITH REVISION COMPONENTS Left 02/21/2015   Procedure: TOTAL KNEE ARTHROPLASTY WITH REVISION COMPONENTS;  Surgeon: Lerner Sensor, MD;  Location: [facility];  Service: Orthopedics;  Laterality: Left;   XI ROBOTIC LAPAROSCOPIC ASSISTED APPENDECTOMY N/A 07/27/2020   Procedure: XI ROBOTIC LAPAROSCOPIC ASSISTED APPENDECTOMY;  Surgeon: Rodolph Romano, MD;  Location: [facility];  Service: General;  Laterality: N/A;    Family History  Problem Relation Age of Onset   Heart disease Mother        CAD, MI   Hypertension Mother    Stroke Father    Aneurysm Father        died  from rupture of AAA   Cancer Paternal Grandfather        prostate    Social History   Socioeconomic History   Marital status: Married    Spouse name: Not on file   Number of children: 5   Years of education: Not on file   Highest education level: Not on file  Occupational History   Occupation: Transport planner    Comment: [employer]--retired   Occupation: Landlord  Tobacco Use   Smoking status: Never    Passive exposure: Past   Smokeless tobacco: Never  Vaping Use   Vaping status: Never Used  Substance and Sexual Activity   Alcohol use: Yes    Alcohol/week: 14.0 standard drinks of alcohol    Types: 14 Cans of beer per week    Comment: 2 beers a day in general.   Drug use: No   Sexual activity: Not on file  Other Topics  Concern   Not on file  Social History Narrative   2nd marriage.  3 children from first marriage, 2 stepchildren, 2 with current marriage.      Has living will   Wife is health care POA--daughters Erin/Francie  ould be alternate   Would accept resuscitation   Would accept tube feeds briefly but not if cognitively unaware   Social Drivers of Health   Financial Resource Strain: Not on file  Food Insecurity: Not on file  Transportation Needs: Not on file  Physical Activity: Not on file  Stress: Not on file  Social Connections: Not on file  Intimate Partner Violence: Not on file   Review of Systems  Constitutional:  Negative for fatigue and unexpected weight change.       Wears seat belt  HENT:  Negative for dental problem, hearing loss and tinnitus.        Keeps up with dentist  Eyes:  Negative for visual disturbance.       No diplopia or unilateral vision loss  Respiratory:  Negative for cough, chest tightness and shortness of breath.   Cardiovascular:  Negative for chest pain, palpitations and leg swelling.  Gastrointestinal:  Negative for blood in stool and constipation.       No heartburn  Endocrine: Negative for polydipsia and polyuria.  Genitourinary:  Negative for difficulty urinating, dysuria and urgency.       No sig sexual problems  Musculoskeletal:  Negative for joint swelling.       Some mild arthritis---tylenol  prn  Skin:  Negative for rash.       Has spot on right temple---similar to past lesions  Allergic/Immunologic: Negative for environmental allergies and immunocompromised state.  Neurological:  Negative for dizziness, speech difficulty, light-headedness and headaches.  Psychiatric/Behavioral:  Negative for dysphoric mood and sleep disturbance. The patient is not nervous/anxious.        Sleeps well with nightly CPAP       Objective:   Physical Exam Constitutional:      Appearance: Normal appearance.  HENT:     Mouth/Throat:     Pharynx: No  oropharyngeal exudate or posterior oropharyngeal erythema.   Eyes:     Conjunctiva/sclera: Conjunctivae normal.     Pupils: Pupils are equal, round, and reactive to light.    Cardiovascular:     Rate and Rhythm: Normal rate and regular rhythm.     Pulses: Normal pulses.     Heart sounds: No murmur heard.    No gallop.  Pulmonary:     Effort:  Pulmonary effort is normal.     Breath sounds: Normal breath sounds. No wheezing or rales.  Abdominal:     Palpations: Abdomen is soft.     Tenderness: There is no abdominal tenderness.   Musculoskeletal:     Cervical back: Neck supple.     Right lower leg: No edema.     Left lower leg: No edema.  Lymphadenopathy:     Cervical: No cervical adenopathy.   Skin:    Findings: No lesion or rash.     Comments: Mycotic toenails   Neurological:     General: No focal deficit present.     Mental Status: He is alert and oriented to person, place, and time.   Psychiatric:        Mood and Affect: Mood normal.        Behavior: Behavior normal.            Assessment & Plan:

## 2023-07-24 NOTE — Assessment & Plan Note (Signed)
 Prefers no statin for now On fish oil

## 2023-07-24 NOTE — Assessment & Plan Note (Signed)
 Healthy Consider one last colonoscopy 2031 Will check (probably last) PSA Discussed exercise Prefers no COVID vaccine Recommended flu vaccine in the fall Will consider shingrix

## 2023-07-24 NOTE — Assessment & Plan Note (Signed)
 Doing well on the citalopram  40mg  and aripiprazole  2mg  daily No wean due to multiple recurrences (or could consider decreasing citalopram  to 20)

## 2023-07-24 NOTE — Assessment & Plan Note (Signed)
Does well with nightly CPAP

## 2023-07-25 ENCOUNTER — Ambulatory Visit: Payer: Self-pay | Admitting: Internal Medicine

## 2023-07-25 LAB — HEPATITIS C ANTIBODY: Hepatitis C Ab: NONREACTIVE

## 2023-08-21 DIAGNOSIS — D485 Neoplasm of uncertain behavior of skin: Secondary | ICD-10-CM | POA: Diagnosis not present

## 2023-08-21 DIAGNOSIS — D0439 Carcinoma in situ of skin of other parts of face: Secondary | ICD-10-CM | POA: Diagnosis not present

## 2023-08-21 DIAGNOSIS — Z85828 Personal history of other malignant neoplasm of skin: Secondary | ICD-10-CM | POA: Diagnosis not present

## 2023-08-21 DIAGNOSIS — L570 Actinic keratosis: Secondary | ICD-10-CM | POA: Diagnosis not present

## 2023-08-21 DIAGNOSIS — Z08 Encounter for follow-up examination after completed treatment for malignant neoplasm: Secondary | ICD-10-CM | POA: Diagnosis not present

## 2023-08-21 DIAGNOSIS — L821 Other seborrheic keratosis: Secondary | ICD-10-CM | POA: Diagnosis not present

## 2023-09-11 ENCOUNTER — Ambulatory Visit: Payer: Medicare Other | Admitting: Primary Care

## 2023-10-02 DIAGNOSIS — G4733 Obstructive sleep apnea (adult) (pediatric): Secondary | ICD-10-CM | POA: Diagnosis not present

## 2023-10-02 DIAGNOSIS — Z7689 Persons encountering health services in other specified circumstances: Secondary | ICD-10-CM | POA: Diagnosis not present

## 2023-10-02 DIAGNOSIS — E785 Hyperlipidemia, unspecified: Secondary | ICD-10-CM | POA: Diagnosis not present

## 2023-10-16 ENCOUNTER — Ambulatory Visit: Admitting: Primary Care

## 2023-10-16 ENCOUNTER — Encounter: Payer: Self-pay | Admitting: Primary Care

## 2023-10-16 VITALS — BP 118/68 | HR 86 | Ht 66.25 in | Wt 206.0 lb

## 2023-10-16 DIAGNOSIS — G4733 Obstructive sleep apnea (adult) (pediatric): Secondary | ICD-10-CM | POA: Diagnosis not present

## 2023-10-16 NOTE — Patient Instructions (Signed)
  VISIT SUMMARY: You came in today for your annual follow-up for CPAP management. Your severe obstructive sleep apnea, diagnosed in March 2023, is well controlled with your current CPAP therapy.  YOUR PLAN: -OBSTRUCTIVE SLEEP APNEA: Obstructive sleep apnea is a condition where your airway becomes blocked during sleep, causing breathing pauses. Your condition is well controlled with CPAP therapy, reducing your apneic events from 31 per hour to 2.5 per hour. You are using your CPAP machine consistently, averaging 8 hours and 11 minutes per night, with no significant daytime sleepiness affecting your daily activities. We will renew your CPAP supplies for one year with [employer], and you can contact them for any further assistance.  INSTRUCTIONS: Continue using your CPAP machine as directed. We have renewed your CPAP supplies for one year with [facility]. If you need any assistance, please contact [employer] using the provided number.  [employer]'s contact- [phone] or [phone]  Orders: Renew CPAP supplies with [facility] x 1 year   Follow-up 1 year with Beth NP or sooner if needed

## 2023-10-16 NOTE — Progress Notes (Signed)
 @Patient  ID: Brendan Bell, male    DOB: Mar 29, 1950, 73 y.o.   MRN: [id number]  Chief Complaint  Patient presents with   Obstructive Sleep Apnea    Referring provider: Jimmy Charlie FERNS, MD  HPI: 73 year old male, never smoked.  Past medical history significant for obstructive sleep apnea, allergic rhinitis, TIA, hyperlipidemia, depression.  Previous [facility] pulmonary encounter:  07/28/2021 Patient presents today for 2 to 107-month follow-up/OSA. Patient had sleep study on April 19, 2021 that showed evidence of severe obstructive sleep apnea, AHI 31/hour with SPO2 low 83%.  He was started on CPAP back in April 2023.  Patient has been compliant with CPAP use.  Current pressure settings auto titrate 5 to 15 cm H2O without significant residual apneas.  He is doing very well. He is compliance with CPAP use. No issues with pressure setting or mask fit. Daytime sleeping has significantly improved since starting on CPAP therapy. He rarely gets tired during the day. Sleeping well at night.   Airview download 06/27/2021 - 07/26/2021 28/30 days (93%) > 4 hours Average usage 8 hours 18 minutes Pressure 5 to 15 cm H2O (11.4cm h20-95%) Air leaks 0.5L/min AHI 1.8   09/05/2022 Patient presents today for 1 year follow-up of OSA. Overall he is doing well. He is compliant with CPAP use. He has difficulty tolerating CPAP at times due to chronic nasal congestion. He struggles with tubing when he sleeps at night as well. He finds it inconvenient to travel with his CPAP. He is interested in learning more about [facility], we discuss indications of use and qualifications.   Airview download 08/05/22-09/03/22 Usage 30/30 days (100%); 29 days (97%) greater than 4 hours Average usage 7 hours 39 minutes Pressure 5 to 15 cm H2O (10.4cm h20-95%) Air leaks 0.1 L/min (95%) AHI 1.2   10/16/2023- Interim hx  Discussed the use of AI scribe software for clinical note transcription with the patient, who gave verbal consent to  proceed.  History of Present Illness Brendan Bell is a 73 year old male with severe obstructive sleep apnea who presents for an annual follow-up for CPAP management.  He was diagnosed with severe obstructive sleep apnea following a sleep study in March 2023, which showed 31 apneic events per hour when not using CPAP. He was started on CPAP therapy in April 2023.  Over the past year, he has been using his CPAP machine consistently, with 100% usage over the last 30 days, averaging 8 hours and 11 minutes per night. The CPAP is set to auto-adjust with a minimum pressure of 5 and a maximum of 15, with an average pressure of 10 and a maximum of 13.9. His residual apnea score is 2.5.  He experiences occasional daytime sleepiness, taking naps when inactive, such as while watching TV, but no significant impact on daily functioning. No issues with the CPAP machine, and he reports good sleep quality.  Airivew download 09/15/23-10/14/23 Usage days 30/30 (100%) > 4 hours Average usage 8 hours 11 mins Pressure 5-15cm h20 (12.3cm h20-95%) Airleaks 1.3L/min (95%) AHI 2.5   No Known Allergies  Immunization History  Administered Date(s) Administered   Fluad Quad(high Dose 65+) 11/18/2019   Hepatitis A 01/18/2016, 03/02/2016   Hepatitis B 01/18/2016, 03/02/2016   Moderna Sars-Covid-2 Vaccination 03/17/2019, 04/14/2019, 11/25/2019   Pneumococcal Conjugate-13 04/29/2019   Pneumococcal Polysaccharide-23 05/03/2020   Td 10/24/2007, 03/21/2017    Past Medical History:  Diagnosis Date   Anxiety    COVID-19    Depression  Headache    silent migraines   Hyperlipidemia    Osteoarthritis    Pneumonia 10/04/1998   out patient   Rhinitis, allergic    Sleep apnea    being fitted for a CPAP 05/17/21    Tobacco History: Social History   Tobacco Use  Smoking Status Never   Passive exposure: Past  Smokeless Tobacco Never   Counseling given: Not Answered   Outpatient Medications  Prior to Visit  Medication Sig Dispense Refill   acetaminophen  (TYLENOL ) 650 MG CR tablet Take 650 mg by mouth every 8 (eight) hours as needed for pain.     ARIPiprazole  (ABILIFY ) 2 MG tablet Take 1 tablet (2 mg total) by mouth daily. 90 tablet 3   aspirin  EC 81 MG tablet Take 81 mg by mouth daily. Swallow whole.     Cholecalciferol (VITAMIN D3) 50 MCG (2000 UT) capsule Take 2,000 Units by mouth 2 (two) times a week.     citalopram  (CELEXA ) 40 MG tablet TAKE ONE TABLET BY MOUTH EVERY DAY 90 tablet 3   MAGNESIUM GLUCONATE PO Take 225 mg by mouth 2 (two) times a week.     Multiple Vitamins-Minerals (MULTIVITAMIN PO) Take 1 tablet by mouth daily.     Multiple Vitamins-Minerals (PRESERVISION AREDS PO) Take 1 tablet by mouth daily.     Omega-3 Fatty Acids (OMEGA 3 PO) Take 1 capsule by mouth 2 (two) times a week.     rosuvastatin (CRESTOR) 10 MG tablet Take 10 mg by mouth daily.     No facility-administered medications prior to visit.      Review of Systems  Review of Systems  Constitutional: Negative.   Respiratory: Negative.       Physical Exam  BP 118/68   Pulse 86   Ht 5' 6.25 (1.683 m)   Wt 206 lb (93.4 kg)   SpO2 98%   BMI 33.00 kg/m  Physical Exam Constitutional:      Appearance: Normal appearance. He is well-developed.  HENT:     Head: Normocephalic and atraumatic.     Mouth/Throat:     Mouth: Mucous membranes are moist.     Pharynx: Oropharynx is clear.  Cardiovascular:     Rate and Rhythm: Normal rate and regular rhythm.     Heart sounds: Normal heart sounds.  Pulmonary:     Effort: Pulmonary effort is normal. No respiratory distress.     Breath sounds: Normal breath sounds. No wheezing or rhonchi.  Musculoskeletal:        General: Normal range of motion.     Cervical back: Normal range of motion and neck supple.  Skin:    General: Skin is warm and dry.     Findings: No erythema or rash.  Neurological:     General: No focal deficit present.     Mental  Status: He is alert and oriented to person, place, and time. Mental status is at baseline.  Psychiatric:        Mood and Affect: Mood normal.        Behavior: Behavior normal.        Thought Content: Thought content normal.        Judgment: Judgment normal.      Lab Results:  CBC    Component Value Date/Time   WBC 7.7 07/24/2023 1043   RBC 5.55 07/24/2023 1043   HGB 16.3 07/24/2023 1043   HCT 49.1 07/24/2023 1043   PLT 205.0 07/24/2023 1043   MCV 88.4 07/24/2023  1043   MCH 29.3 07/25/2020 0848   MCHC 33.2 07/24/2023 1043   RDW 14.7 07/24/2023 1043   LYMPHSABS 2.7 02/10/2015 0941   MONOABS 0.6 02/10/2015 0941   EOSABS 0.1 02/10/2015 0941   BASOSABS 0.0 02/10/2015 0941    BMET    Component Value Date/Time   NA 139 07/24/2023 1043   K 4.6 07/24/2023 1043   CL 103 07/24/2023 1043   CO2 28 07/24/2023 1043   GLUCOSE 94 07/24/2023 1043   BUN 34 (H) 07/24/2023 1043   CREATININE 1.35 07/24/2023 1043   CALCIUM 9.5 07/24/2023 1043   GFRNONAA >60 04/11/2020 0525   GFRAA >60 01/09/2018 0411    BNP No results found for: BNP  ProBNP No results found for: PROBNP  Imaging: No results found.   Assessment & Plan:   1. Obstructive sleep apnea (Primary)   Assessment and Plan Assessment & Plan Obstructive sleep apnea, well controlled with CPAP Severe obstructive sleep apnea diagnosed in March 2023 with 31 apneic events per hour, currently well controlled with CPAP therapy, reducing apneic events to 2.5 per hour. CPAP usage is consistent at 100% with an average of 8 hours and 11 minutes per night. Pressure settings are on auto set with a minimum of 5 and a maximum of 15, averaging at 10 and occasionally reaching 13.9. No significant daytime sleepiness affecting daily functioning. No issues reported with the CPAP machine. Risks of untreated sleep apnea, such as cardiac arrhythmia, stroke, pulmonary hypertension, diabetes, and Alzheimer's, are minimized with current  treatment. He prefers to continue with CPAP rather than exploring Inspire therapy. - Renew CPAP supplies for one year with [employer]. - Provide contact number for [employer].    Almarie LELON Ferrari, NP 10/16/2023

## 2023-10-21 DIAGNOSIS — H43811 Vitreous degeneration, right eye: Secondary | ICD-10-CM | POA: Diagnosis not present

## 2023-10-21 DIAGNOSIS — H35363 Drusen (degenerative) of macula, bilateral: Secondary | ICD-10-CM | POA: Diagnosis not present

## 2023-10-21 DIAGNOSIS — H353131 Nonexudative age-related macular degeneration, bilateral, early dry stage: Secondary | ICD-10-CM | POA: Diagnosis not present

## 2023-10-21 DIAGNOSIS — Z961 Presence of intraocular lens: Secondary | ICD-10-CM | POA: Diagnosis not present

## 2023-10-22 ENCOUNTER — Other Ambulatory Visit: Payer: Self-pay

## 2023-10-22 MED ORDER — CITALOPRAM HYDROBROMIDE 40 MG PO TABS: 40.0000 mg | ORAL_TABLET | Freq: Every day | ORAL | 2 refills | Status: AC

## 2023-10-22 NOTE — Telephone Encounter (Signed)
 Rx sent electronically.

## 2023-11-06 DIAGNOSIS — R208 Other disturbances of skin sensation: Secondary | ICD-10-CM | POA: Diagnosis not present

## 2023-11-06 DIAGNOSIS — L720 Epidermal cyst: Secondary | ICD-10-CM | POA: Diagnosis not present

## 2023-11-06 DIAGNOSIS — L538 Other specified erythematous conditions: Secondary | ICD-10-CM | POA: Diagnosis not present

## 2023-11-06 DIAGNOSIS — L989 Disorder of the skin and subcutaneous tissue, unspecified: Secondary | ICD-10-CM | POA: Diagnosis not present

## 2023-11-13 ENCOUNTER — Other Ambulatory Visit (HOSPITAL_BASED_OUTPATIENT_CLINIC_OR_DEPARTMENT_OTHER): Payer: Self-pay | Admitting: Physical Medicine and Rehabilitation

## 2023-11-13 DIAGNOSIS — E785 Hyperlipidemia, unspecified: Secondary | ICD-10-CM

## 2023-12-06 ENCOUNTER — Ambulatory Visit (HOSPITAL_BASED_OUTPATIENT_CLINIC_OR_DEPARTMENT_OTHER)
Admission: RE | Admit: 2023-12-06 | Discharge: 2023-12-06 | Disposition: A | Discharge status: Home / Self Care | Payer: Self-pay | Source: Ambulatory Visit | Attending: Physical Medicine and Rehabilitation | Admitting: Physical Medicine and Rehabilitation

## 2023-12-06 DIAGNOSIS — E785 Hyperlipidemia, unspecified: Secondary | ICD-10-CM

## 2024-07-29 ENCOUNTER — Encounter: Admitting: Nurse Practitioner
